# Patient Record
Sex: Female | Born: 1940 | Race: White | Hispanic: No | Smoking: Never smoker
Health system: Southern US, Community
[De-identification: ages and names within clinical notes are randomized; demographics above are authoritative.]

## PROBLEM LIST (undated history)

## (undated) DIAGNOSIS — R413 Other amnesia: Secondary | ICD-10-CM

## (undated) DIAGNOSIS — Z87442 Personal history of urinary calculi: Secondary | ICD-10-CM

## (undated) DIAGNOSIS — I5032 Chronic diastolic (congestive) heart failure: Secondary | ICD-10-CM

## (undated) DIAGNOSIS — E7800 Pure hypercholesterolemia, unspecified: Secondary | ICD-10-CM

## (undated) DIAGNOSIS — M810 Age-related osteoporosis without current pathological fracture: Secondary | ICD-10-CM

## (undated) DIAGNOSIS — M170 Bilateral primary osteoarthritis of knee: Secondary | ICD-10-CM

## (undated) DIAGNOSIS — I10 Essential (primary) hypertension: Secondary | ICD-10-CM

## (undated) HISTORY — DX: Age-related osteoporosis without current pathological fracture: M81.0

## (undated) HISTORY — DX: Personal history of urinary calculi: Z87.442

---

## 2005-03-27 ENCOUNTER — Ambulatory Visit: Payer: Self-pay | Admitting: Family Medicine

## 2006-02-19 ENCOUNTER — Emergency Department: Payer: Self-pay | Admitting: Emergency Medicine

## 2010-02-17 LAB — HM PAP SMEAR

## 2010-03-20 ENCOUNTER — Ambulatory Visit: Payer: Self-pay | Admitting: Family Medicine

## 2010-03-31 ENCOUNTER — Ambulatory Visit: Payer: Self-pay | Admitting: Family Medicine

## 2011-04-07 ENCOUNTER — Ambulatory Visit: Payer: Self-pay | Admitting: Family Medicine

## 2012-04-11 DIAGNOSIS — Z1331 Encounter for screening for depression: Secondary | ICD-10-CM | POA: Diagnosis not present

## 2012-04-11 DIAGNOSIS — M810 Age-related osteoporosis without current pathological fracture: Secondary | ICD-10-CM | POA: Diagnosis not present

## 2012-04-11 DIAGNOSIS — Z0000 Encounter for general adult medical examination without abnormal findings: Secondary | ICD-10-CM | POA: Diagnosis not present

## 2012-04-11 DIAGNOSIS — E785 Hyperlipidemia, unspecified: Secondary | ICD-10-CM | POA: Diagnosis not present

## 2012-04-11 DIAGNOSIS — Z1289 Encounter for screening for malignant neoplasm of other sites: Secondary | ICD-10-CM | POA: Diagnosis not present

## 2012-05-05 DIAGNOSIS — Z23 Encounter for immunization: Secondary | ICD-10-CM | POA: Diagnosis not present

## 2012-06-02 ENCOUNTER — Ambulatory Visit: Payer: Self-pay | Admitting: Family Medicine

## 2012-06-02 DIAGNOSIS — Z1231 Encounter for screening mammogram for malignant neoplasm of breast: Secondary | ICD-10-CM | POA: Diagnosis not present

## 2013-04-17 DIAGNOSIS — M810 Age-related osteoporosis without current pathological fracture: Secondary | ICD-10-CM | POA: Diagnosis not present

## 2013-04-17 DIAGNOSIS — Z13220 Encounter for screening for lipoid disorders: Secondary | ICD-10-CM | POA: Diagnosis not present

## 2013-04-17 DIAGNOSIS — Z23 Encounter for immunization: Secondary | ICD-10-CM | POA: Diagnosis not present

## 2013-04-17 DIAGNOSIS — Z0000 Encounter for general adult medical examination without abnormal findings: Secondary | ICD-10-CM | POA: Diagnosis not present

## 2013-04-19 ENCOUNTER — Ambulatory Visit: Payer: Self-pay | Admitting: Family Medicine

## 2013-04-19 DIAGNOSIS — N959 Unspecified menopausal and perimenopausal disorder: Secondary | ICD-10-CM | POA: Diagnosis not present

## 2013-04-19 DIAGNOSIS — M8610 Other acute osteomyelitis, unspecified site: Secondary | ICD-10-CM | POA: Diagnosis not present

## 2013-04-19 DIAGNOSIS — Z780 Asymptomatic menopausal state: Secondary | ICD-10-CM | POA: Diagnosis not present

## 2013-04-19 LAB — HM DEXA SCAN

## 2013-06-04 ENCOUNTER — Ambulatory Visit: Payer: Self-pay | Admitting: Family Medicine

## 2013-06-04 DIAGNOSIS — Z1231 Encounter for screening mammogram for malignant neoplasm of breast: Secondary | ICD-10-CM | POA: Diagnosis not present

## 2013-06-04 DIAGNOSIS — R922 Inconclusive mammogram: Secondary | ICD-10-CM | POA: Diagnosis not present

## 2014-03-14 DIAGNOSIS — Z23 Encounter for immunization: Secondary | ICD-10-CM | POA: Diagnosis not present

## 2014-05-22 DIAGNOSIS — Z23 Encounter for immunization: Secondary | ICD-10-CM | POA: Diagnosis not present

## 2014-05-22 DIAGNOSIS — E039 Hypothyroidism, unspecified: Secondary | ICD-10-CM | POA: Diagnosis not present

## 2014-05-22 DIAGNOSIS — Z0000 Encounter for general adult medical examination without abnormal findings: Secondary | ICD-10-CM | POA: Diagnosis not present

## 2014-05-22 DIAGNOSIS — E785 Hyperlipidemia, unspecified: Secondary | ICD-10-CM | POA: Diagnosis not present

## 2014-05-22 DIAGNOSIS — M810 Age-related osteoporosis without current pathological fracture: Secondary | ICD-10-CM | POA: Diagnosis not present

## 2014-06-05 ENCOUNTER — Ambulatory Visit: Payer: Self-pay | Admitting: Family Medicine

## 2014-06-05 DIAGNOSIS — Z1231 Encounter for screening mammogram for malignant neoplasm of breast: Secondary | ICD-10-CM | POA: Diagnosis not present

## 2014-06-05 LAB — HM MAMMOGRAPHY

## 2014-09-06 DIAGNOSIS — J029 Acute pharyngitis, unspecified: Secondary | ICD-10-CM | POA: Diagnosis not present

## 2014-09-06 DIAGNOSIS — A084 Viral intestinal infection, unspecified: Secondary | ICD-10-CM | POA: Diagnosis not present

## 2014-11-26 DIAGNOSIS — J069 Acute upper respiratory infection, unspecified: Secondary | ICD-10-CM | POA: Diagnosis not present

## 2015-04-30 DIAGNOSIS — M810 Age-related osteoporosis without current pathological fracture: Secondary | ICD-10-CM | POA: Insufficient documentation

## 2015-05-26 ENCOUNTER — Encounter: Payer: Self-pay | Admitting: Family Medicine

## 2015-06-05 ENCOUNTER — Telehealth: Payer: Self-pay | Admitting: Family Medicine

## 2015-06-05 ENCOUNTER — Encounter: Payer: Self-pay | Admitting: Family Medicine

## 2015-06-05 ENCOUNTER — Ambulatory Visit (INDEPENDENT_AMBULATORY_CARE_PROVIDER_SITE_OTHER): Payer: Medicare Other | Admitting: Family Medicine

## 2015-06-05 ENCOUNTER — Other Ambulatory Visit: Payer: Self-pay

## 2015-06-05 VITALS — BP 119/72 | HR 62 | Temp 97.9°F | Ht 65.5 in | Wt 113.0 lb

## 2015-06-05 DIAGNOSIS — M810 Age-related osteoporosis without current pathological fracture: Secondary | ICD-10-CM | POA: Diagnosis not present

## 2015-06-05 DIAGNOSIS — R5383 Other fatigue: Secondary | ICD-10-CM | POA: Insufficient documentation

## 2015-06-05 DIAGNOSIS — M170 Bilateral primary osteoarthritis of knee: Secondary | ICD-10-CM | POA: Diagnosis not present

## 2015-06-05 DIAGNOSIS — Z23 Encounter for immunization: Secondary | ICD-10-CM

## 2015-06-05 DIAGNOSIS — R5382 Chronic fatigue, unspecified: Secondary | ICD-10-CM

## 2015-06-05 DIAGNOSIS — Z0000 Encounter for general adult medical examination without abnormal findings: Secondary | ICD-10-CM

## 2015-06-05 DIAGNOSIS — Z1211 Encounter for screening for malignant neoplasm of colon: Secondary | ICD-10-CM

## 2015-06-05 LAB — URINALYSIS, ROUTINE W REFLEX MICROSCOPIC
BILIRUBIN UA: NEGATIVE
GLUCOSE, UA: NEGATIVE
KETONES UA: NEGATIVE
Leukocytes, UA: NEGATIVE
NITRITE UA: NEGATIVE
Protein, UA: NEGATIVE
RBC, UA: NEGATIVE
Specific Gravity, UA: 1.02 (ref 1.005–1.030)
pH, UA: 8.5 — ABNORMAL HIGH (ref 5.0–7.5)

## 2015-06-05 MED ORDER — RALOXIFENE HCL 60 MG PO TABS: 60.0000 mg | ORAL_TABLET | Freq: Every day | ORAL | Status: DC

## 2015-06-05 MED ORDER — TRETINOIN 0.05 % EX CREA: TOPICAL_CREAM | Freq: Every day | CUTANEOUS | Status: DC

## 2015-06-05 NOTE — Progress Notes (Signed)
BP 119/72 mmHg  Pulse 62  Temp(Src) 97.9 F (36.6 C)  Ht 5' 5.5" (1.664 m)  Wt 113 lb (51.256 kg)  BMI 18.51 kg/m2  SpO2 99%   Subjective:    Patient ID: Amanda Bruce, female    DOB: Oct 11, 1940, 74 y.o.   MRN: [id number]  HPI: Amanda Bruce is a 74 y.o. female  Chief Complaint  Patient presents with  . Annual Exam   patient doing well with no complaints taking Evista without problems Dances 3 nights a week has some occasional twinges of her hip and knee but nothing that bothers her dancing. Occasional use of Retin-A cream without problems Patient was some fatigue at times especially at the end of the day sleeps well discerned about thyroid or blood count Relevant past medical, surgical, family and social history reviewed and updated as indicated. Interim medical history since our last visit reviewed. Allergies and medications reviewed and updated.  Review of Systems  Constitutional: Negative.   HENT: Negative.   Eyes: Negative.   Respiratory: Negative.   Cardiovascular: Negative.   Gastrointestinal: Negative.   Endocrine: Negative.   Genitourinary: Negative.   Musculoskeletal: Negative.   Skin: Negative.   Allergic/Immunologic: Negative.   Neurological: Negative.   Hematological: Negative.   Psychiatric/Behavioral: Negative.     Per HPI unless specifically indicated above     Objective:    BP 119/72 mmHg  Pulse 62  Temp(Src) 97.9 F (36.6 C)  Ht 5' 5.5" (1.664 m)  Wt 113 lb (51.256 kg)  BMI 18.51 kg/m2  SpO2 99%  Wt Readings from Last 3 Encounters:  06/05/15 113 lb (51.256 kg)  11/26/14 111 lb (50.349 kg)    Physical Exam  Constitutional: She is oriented to person, place, and time. She appears well-developed and well-nourished. No distress.  HENT:  Head: Normocephalic and atraumatic.  Right Ear: Hearing normal.  Left Ear: Hearing normal.  Nose: Nose normal.  Eyes: Conjunctivae and lids are normal. Right eye exhibits no discharge. Left eye  exhibits no discharge. No scleral icterus.  Pulmonary/Chest: Effort normal. No respiratory distress.  Musculoskeletal: Normal range of motion.  Neurological: She is alert and oriented to person, place, and time.  Skin: Skin is intact. No rash noted.  Psychiatric: She has a normal mood and affect. Her speech is normal and behavior is normal. Judgment and thought content normal. Cognition and memory are normal.    Results for orders placed or performed in visit on 04/30/15  HM MAMMOGRAPHY  Result Value Ref Range   HM Mammogram per PP   HM DEXA SCAN  Result Value Ref Range   HM Dexa Scan per PP   HM PAP SMEAR  Result Value Ref Range   HM Pap smear per PP       Assessment & Plan:   Problem List Items Addressed This Visit      Musculoskeletal and Integument   Osteoporosis    The current medical regimen is effective;  continue present plan and medications.       Relevant Medications   raloxifene (EVISTA) 60 MG tablet   Other Relevant Orders   CBC with Differential/Platelet   Comprehensive metabolic panel   Urinalysis, Routine w reflex microscopic (not at [facility])   TSH   Osteoarthritis of both knees    Discussed care and treatment of arthritis strengthening use of over-the-counter medications      Relevant Orders   CBC with Differential/Platelet   Comprehensive metabolic panel  Urinalysis, Routine w reflex microscopic (not at [facility])   TSH     Other   Fatigue   Relevant Orders   CBC with Differential/Platelet   Comprehensive metabolic panel   Urinalysis, Routine w reflex microscopic (not at [facility])   TSH    Other Visit Diagnoses    Immunization due    -  Primary    Relevant Orders    Flu Vaccine QUAD 36+ mos PF IM (Fluarix & Fluzone Quad PF) (Completed)    PE (physical exam), annual        Relevant Orders    CBC with Differential/Platelet    Comprehensive metabolic panel    Urinalysis, Routine w reflex microscopic (not at [facility])    TSH        Follow up plan: No  Follow-up on file.

## 2015-06-05 NOTE — Assessment & Plan Note (Signed)
Discussed care and treatment of arthritis strengthening use of over-the-counter medications

## 2015-06-05 NOTE — Assessment & Plan Note (Signed)
The current medical regimen is effective;  continue present plan and medications.  

## 2015-06-05 NOTE — Telephone Encounter (Signed)
Pt would like you to put order in for her mammogram.

## 2015-06-06 LAB — COMPREHENSIVE METABOLIC PANEL
A/G RATIO: 2 (ref 1.1–2.5)
ALBUMIN: 4.2 g/dL (ref 3.5–4.8)
ALK PHOS: 73 IU/L (ref 39–117)
ALT: 8 IU/L (ref 0–32)
AST: 16 IU/L (ref 0–40)
BUN / CREAT RATIO: 36 — AB (ref 11–26)
BUN: 23 mg/dL (ref 8–27)
Bilirubin Total: 0.5 mg/dL (ref 0.0–1.2)
CALCIUM: 9.2 mg/dL (ref 8.7–10.3)
CO2: 31 mmol/L — AB (ref 18–29)
CREATININE: 0.64 mg/dL (ref 0.57–1.00)
Chloride: 103 mmol/L (ref 97–106)
GFR calc Af Amer: 102 mL/min/{1.73_m2} (ref >59)
GFR, EST NON AFRICAN AMERICAN: 89 mL/min/{1.73_m2} (ref >59)
GLOBULIN, TOTAL: 2.1 g/dL (ref 1.5–4.5)
Glucose: 77 mg/dL (ref 65–99)
Potassium: 4.2 mmol/L (ref 3.5–5.2)
SODIUM: 145 mmol/L — AB (ref 136–144)
Total Protein: 6.3 g/dL (ref 6.0–8.5)

## 2015-06-06 LAB — CBC WITH DIFFERENTIAL/PLATELET
BASOS: 1 %
Basophils Absolute: 0 10*3/uL (ref 0.0–0.2)
EOS (ABSOLUTE): 0.1 10*3/uL (ref 0.0–0.4)
EOS: 2 %
HEMOGLOBIN: 13 g/dL (ref 11.1–15.9)
Hematocrit: 38.3 % (ref 34.0–46.6)
Immature Grans (Abs): 0 10*3/uL (ref 0.0–0.1)
Immature Granulocytes: 0 %
LYMPHS ABS: 1.2 10*3/uL (ref 0.7–3.1)
Lymphs: 30 %
MCH: 32.9 pg (ref 26.6–33.0)
MCHC: 33.9 g/dL (ref 31.5–35.7)
MCV: 97 fL (ref 79–97)
MONOCYTES: 8 %
MONOS ABS: 0.3 10*3/uL (ref 0.1–0.9)
NEUTROS ABS: 2.3 10*3/uL (ref 1.4–7.0)
Neutrophils: 59 %
Platelets: 237 10*3/uL (ref 150–379)
RBC: 3.95 x10E6/uL (ref 3.77–5.28)
RDW: 13.4 % (ref 12.3–15.4)
WBC: 4 10*3/uL (ref 3.4–10.8)

## 2015-06-06 LAB — TSH: TSH: 1.49 u[IU]/mL (ref 0.450–4.500)

## 2015-06-09 ENCOUNTER — Other Ambulatory Visit: Payer: Self-pay

## 2015-06-09 ENCOUNTER — Encounter: Payer: Self-pay | Admitting: Family Medicine

## 2015-06-09 DIAGNOSIS — Z1239 Encounter for other screening for malignant neoplasm of breast: Secondary | ICD-10-CM

## 2015-06-09 NOTE — Telephone Encounter (Signed)
Entered order, ABN was created, Medicare wants every two years. Patient notified

## 2015-06-09 NOTE — Telephone Encounter (Signed)
mamo order? See note

## 2015-06-10 ENCOUNTER — Other Ambulatory Visit: Payer: Self-pay | Admitting: Family Medicine

## 2015-06-10 DIAGNOSIS — Z1231 Encounter for screening mammogram for malignant neoplasm of breast: Secondary | ICD-10-CM

## 2015-06-11 ENCOUNTER — Ambulatory Visit: Payer: Self-pay

## 2015-06-12 DIAGNOSIS — Z1211 Encounter for screening for malignant neoplasm of colon: Secondary | ICD-10-CM | POA: Diagnosis not present

## 2015-06-12 DIAGNOSIS — Z1212 Encounter for screening for malignant neoplasm of rectum: Secondary | ICD-10-CM | POA: Diagnosis not present

## 2015-06-13 ENCOUNTER — Other Ambulatory Visit: Payer: Self-pay | Admitting: Family Medicine

## 2015-06-13 ENCOUNTER — Ambulatory Visit
Admission: RE | Admit: 2015-06-13 | Discharge: 2015-06-13 | Disposition: A | Discharge status: Home / Self Care | Payer: Medicare Other | Source: Ambulatory Visit | Attending: Family Medicine | Admitting: Family Medicine

## 2015-06-13 DIAGNOSIS — Z1231 Encounter for screening mammogram for malignant neoplasm of breast: Secondary | ICD-10-CM | POA: Diagnosis not present

## 2015-06-26 LAB — COLOGUARD: Cologuard: NEGATIVE

## 2015-07-02 ENCOUNTER — Other Ambulatory Visit: Payer: Self-pay

## 2015-07-09 ENCOUNTER — Telehealth: Payer: Self-pay | Admitting: Family Medicine

## 2015-07-09 NOTE — Telephone Encounter (Signed)
Patient son called stating that patient has been coughing a lot and needs to be seen, he thinks she may have pneumonia. Per Tiffany Reel and Amy Rachell Cipro they stated for me to tell him that if patient gets any worse with fever, chest pain, shortness of breath to go to the local ER or urgent care due to her age. Son said that she will be fine until appointment and will keep her appointment on Friday January the 78 th.

## 2015-07-11 ENCOUNTER — Encounter: Payer: Self-pay | Admitting: Family Medicine

## 2015-07-11 ENCOUNTER — Ambulatory Visit (INDEPENDENT_AMBULATORY_CARE_PROVIDER_SITE_OTHER): Payer: Medicare Other | Admitting: Family Medicine

## 2015-07-11 VITALS — BP 139/83 | HR 89 | Temp 98.0°F | Ht 64.6 in | Wt 108.0 lb

## 2015-07-11 DIAGNOSIS — J209 Acute bronchitis, unspecified: Secondary | ICD-10-CM | POA: Diagnosis not present

## 2015-07-11 DIAGNOSIS — J40 Bronchitis, not specified as acute or chronic: Secondary | ICD-10-CM

## 2015-07-11 MED ORDER — HYDROCOD POLST-CPM POLST ER 10-8 MG/5ML PO SUER: 5.0000 mL | Freq: Every evening | ORAL | Status: DC | PRN

## 2015-07-11 MED ORDER — ALBUTEROL SULFATE (2.5 MG/3ML) 0.083% IN NEBU: 2.5000 mg | INHALATION_SOLUTION | Freq: Once | RESPIRATORY_TRACT | Status: DC

## 2015-07-11 MED ORDER — BENZONATATE 200 MG PO CAPS: 200.0000 mg | ORAL_CAPSULE | Freq: Three times a day (TID) | ORAL | Status: DC | PRN

## 2015-07-11 MED ORDER — DOXYCYCLINE HYCLATE 100 MG PO TABS: 100.0000 mg | ORAL_TABLET | Freq: Two times a day (BID) | ORAL | Status: DC

## 2015-07-11 NOTE — Progress Notes (Signed)
BP 139/83 mmHg  Pulse 89  Temp(Src) 98 F (36.7 C)  Ht 5' 4.6" (1.641 m)  Wt 108 lb (48.988 kg)  BMI 18.19 kg/m2  SpO2 97%   Subjective:    Patient ID: Amanda Bruce, female    DOB: 1941-06-23, 75 y.o.   MRN: [id number]  HPI: Amanda Bruce is a 75 y.o. female  Chief Complaint  Patient presents with  . Cough    X 1 week   UPPER RESPIRATORY TRACT INFECTION Duration: 6 days Worst symptom: Cough and congestion Fever: no Cough: yes Shortness of breath: no Wheezing: no Chest pain: no Chest tightness: yes Chest congestion: yes Nasal congestion: yes Runny nose: yes Post nasal drip: yes Sneezing: no Sore throat: yes Swollen glands: no Sinus pressure: no Headache: yes Face pain: no Toothache: yes Ear pain: no  Ear pressure: no  Eyes red/itching:no Eye drainage/crusting: no  Vomiting: no Rash: no Fatigue: yes Sick contacts: no Strep contacts: no  Context: better Recurrent sinusitis: no Relief with OTC cold/cough medications: yes  Treatments attempted: mucinex   Relevant past medical, surgical, family and social history reviewed and updated as indicated. Interim medical history since our last visit reviewed. Allergies and medications reviewed and updated.  Review of Systems  Constitutional: Negative.   HENT: Negative.   Respiratory: Negative.   Cardiovascular: Negative.   Gastrointestinal: Positive for nausea and diarrhea. Negative for vomiting, abdominal pain, constipation, blood in stool, abdominal distention, anal bleeding and rectal pain.  Psychiatric/Behavioral: Negative.     Per HPI unless specifically indicated above     Objective:    BP 139/83 mmHg  Pulse 89  Temp(Src) 98 F (36.7 C)  Ht 5' 4.6" (1.641 m)  Wt 108 lb (48.988 kg)  BMI 18.19 kg/m2  SpO2 97%  Wt Readings from Last 3 Encounters:  07/11/15 108 lb (48.988 kg)  06/05/15 113 lb (51.256 kg)  11/26/14 111 lb (50.349 kg)    Physical Exam  Constitutional: She is oriented to  person, place, and time. She appears well-developed and well-nourished. No distress.  HENT:  Head: Normocephalic and atraumatic.  Right Ear: Hearing and external ear normal.  Left Ear: Hearing and external ear normal.  Nose: Mucosal edema and rhinorrhea present. No nose lacerations, sinus tenderness, nasal deformity, septal deviation or nasal septal hematoma. No epistaxis.  No foreign bodies. Right sinus exhibits no maxillary sinus tenderness and no frontal sinus tenderness. Left sinus exhibits no maxillary sinus tenderness and no frontal sinus tenderness.  Mouth/Throat: Uvula is midline and mucous membranes are normal. Posterior oropharyngeal erythema present. No oropharyngeal exudate, posterior oropharyngeal edema or tonsillar abscesses.  Eyes: Conjunctivae, EOM and lids are normal. Pupils are equal, round, and reactive to light. Right eye exhibits no discharge. Left eye exhibits no discharge. No scleral icterus.  Neck: Normal range of motion. Neck supple. No JVD present. No tracheal deviation present. No thyromegaly present.  Cardiovascular: Normal rate, regular rhythm, normal heart sounds and intact distal pulses.  Exam reveals no gallop and no friction rub.   No murmur heard. Pulmonary/Chest: Effort normal. No stridor. No respiratory distress. She has decreased breath sounds. She has no wheezes. She has rhonchi in the right lower field and the left lower field. She has no rales. She exhibits no tenderness.  Musculoskeletal: Normal range of motion.  Lymphadenopathy:    She has cervical adenopathy.  Neurological: She is alert and oriented to person, place, and time.  Skin: Skin is warm, dry and intact. No rash  noted. No erythema. No pallor.  Psychiatric: She has a normal mood and affect. Her speech is normal and behavior is normal. Judgment and thought content normal. Cognition and memory are normal.  Nursing note and vitals reviewed.   Results for orders placed or performed in visit on  06/27/15  Cologuard  Result Value Ref Range   Cologuard Negative       Assessment & Plan:   Problem List Items Addressed This Visit    None    Visit Diagnoses    Bronchitis    -  Primary    Will treat with doxycycline and tessalon perles and tussionex for comfort. Retun in 2 weeks for lung recheck. Call if not getting better or getting worse.    Relevant Medications    albuterol (PROVENTIL) (2.5 MG/3ML) 0.083% nebulizer solution 2.5 mg        Follow up plan: Return in about 2 weeks (around 07/25/2015) for Lung recheck.

## 2015-07-25 ENCOUNTER — Ambulatory Visit (INDEPENDENT_AMBULATORY_CARE_PROVIDER_SITE_OTHER): Payer: Medicare Other | Admitting: Family Medicine

## 2015-07-25 ENCOUNTER — Encounter: Payer: Self-pay | Admitting: Family Medicine

## 2015-07-25 VITALS — BP 132/79 | HR 58 | Temp 98.7°F | Ht 65.1 in | Wt 111.0 lb

## 2015-07-25 DIAGNOSIS — R05 Cough: Secondary | ICD-10-CM

## 2015-07-25 DIAGNOSIS — Z23 Encounter for immunization: Secondary | ICD-10-CM

## 2015-07-25 DIAGNOSIS — R059 Cough, unspecified: Secondary | ICD-10-CM

## 2015-07-25 MED ORDER — ALBUTEROL SULFATE HFA 108 (90 BASE) MCG/ACT IN AERS: 2.0000 | INHALATION_SPRAY | Freq: Four times a day (QID) | RESPIRATORY_TRACT | Status: DC | PRN

## 2015-07-25 NOTE — Progress Notes (Signed)
BP 132/79 mmHg  Pulse 58  Temp(Src) 98.7 F (37.1 C)  Ht 5' 5.1" (1.654 m)  Wt 111 lb (50.349 kg)  BMI 18.40 kg/m2  SpO2 99%   Subjective:    Patient ID: Amanda Bruce, female    DOB: June 05, 1941, 75 y.o.   MRN: [id number]  HPI: Amanda Bruce is a 75 y.o. female  Chief Complaint  Patient presents with  . Lung Recheck   Medicine seemed like it helped. She is feeling much better and is able to get back to her dancing, which he really appreciates. She is still coughing a bit and notes that she has a lot of clear discharge out of her nose. No fevers, no chills, shortness of breath is better. No other concerns or complaints at this time.   Relevant past medical, surgical, family and social history reviewed and updated as indicated. Interim medical history since our last visit reviewed. Allergies and medications reviewed and updated.  Review of Systems  Constitutional: Negative.   HENT: Positive for rhinorrhea. Negative for congestion, dental problem, drooling, ear discharge, ear pain, facial swelling, hearing loss, mouth sores, nosebleeds, postnasal drip, sinus pressure, sneezing, sore throat, tinnitus, trouble swallowing and voice change.   Respiratory: Positive for cough. Negative for apnea, choking, chest tightness, shortness of breath, wheezing and stridor.   Cardiovascular: Negative.   Psychiatric/Behavioral: Negative.     Per HPI unless specifically indicated above     Objective:    BP 132/79 mmHg  Pulse 58  Temp(Src) 98.7 F (37.1 C)  Ht 5' 5.1" (1.654 m)  Wt 111 lb (50.349 kg)  BMI 18.40 kg/m2  SpO2 99%  Wt Readings from Last 3 Encounters:  07/25/15 111 lb (50.349 kg)  07/11/15 108 lb (48.988 kg)  06/05/15 113 lb (51.256 kg)    Physical Exam  Constitutional: She is oriented to person, place, and time. She appears well-developed and well-nourished. No distress.  HENT:  Head: Normocephalic and atraumatic.  Right Ear: Hearing, tympanic membrane, external  ear and ear canal normal.  Left Ear: Hearing, tympanic membrane, external ear and ear canal normal.  Nose: Rhinorrhea present. No mucosal edema, nose lacerations, sinus tenderness, nasal deformity, septal deviation or nasal septal hematoma. No epistaxis.  No foreign bodies.  Mouth/Throat: Uvula is midline, oropharynx is clear and moist and mucous membranes are normal. No oropharyngeal exudate.  Eyes: Conjunctivae, EOM and lids are normal. Pupils are equal, round, and reactive to light. Right eye exhibits no discharge. Left eye exhibits no discharge. No scleral icterus.  Neck: Normal range of motion. Neck supple. No JVD present. No tracheal deviation present. No thyromegaly present.  Cardiovascular: Normal rate, regular rhythm, normal heart sounds and intact distal pulses.  Exam reveals no gallop and no friction rub.   No murmur heard. Pulmonary/Chest: Effort normal. No respiratory distress. She has decreased breath sounds in the right middle field, the right lower field, the left middle field and the left lower field. She has no wheezes. She has no rales. She exhibits no tenderness.  Musculoskeletal: Normal range of motion.  Lymphadenopathy:    She has no cervical adenopathy.  Neurological: She is alert and oriented to person, place, and time.  Skin: Skin is warm, dry and intact. No rash noted. She is not diaphoretic. No erythema. No pallor.  Psychiatric: She has a normal mood and affect. Her speech is normal and behavior is normal. Judgment and thought content normal. Cognition and memory are normal.  Nursing note and vitals  reviewed.   Results for orders placed or performed in visit on 06/27/15  Cologuard  Result Value Ref Range   Cologuard Negative       Assessment & Plan:   Problem List Items Addressed This Visit    None    Visit Diagnoses    Cough    -  Primary    Will treat with albuterol. Call with any problems or concerns. OTC medications discussed for rhinorrhea.    Relevant  Orders    Spirometry with graph (Completed)    Immunization due        Rx for shingles shot given today.      Pre-nebulizer spirometry did not save due to computer issues. Values not available, but she did not hit her expected values. Post spirometry was normal  Follow up plan: Return if symptoms worsen or fail to improve.

## 2015-07-25 NOTE — Patient Instructions (Addendum)
Nasal saline  Sudafed Both of these should help with runny noseAlbuterol inhalation aerosol What is this medicine? ALBUTEROL (al Normajean [place]) is a bronchodilator. It helps open up the airways in your lungs to make it easier to breathe. This medicine is used to treat and to prevent bronchospasm. This medicine may be used for other purposes; ask your health care provider or pharmacist if you have questions. What should I tell my health care provider before I take this medicine? They need to know if you have any of the following conditions: -diabetes -heart disease or irregular heartbeat -high blood pressure -pheochromocytoma -seizures -thyroid disease -an unusual or allergic reaction to albuterol, levalbuterol, sulfites, other medicines, foods, dyes, or preservatives -pregnant or trying to get pregnant -breast-feeding How should I use this medicine? This medicine is for inhalation through the mouth. Follow the directions on your prescription label. Take your medicine at regular intervals. Do not use more often than directed. Make sure that you are using your inhaler correctly. Ask you doctor or health care provider if you have any questions. Talk to your pediatrician regarding the use of this medicine in children. Special care may be needed. Overdosage: If you think you have taken too much of this medicine contact a poison control center or emergency room at once. NOTE: This medicine is only for you. Do not share this medicine with others. What if I miss a dose? If you miss a dose, use it as soon as you can. If it is almost time for your next dose, use only that dose. Do not use double or extra doses. What may interact with this medicine? -anti-infectives like chloroquine and pentamidine -caffeine -cisapride -diuretics -medicines for colds -medicines for depression or for emotional or psychotic conditions -medicines for weight loss including some herbal products -methadone -some  antibiotics like clarithromycin, erythromycin, levofloxacin, and linezolid -some heart medicines -steroid hormones like dexamethasone, cortisone, hydrocortisone -theophylline -thyroid hormones This list may not describe all possible interactions. Give your health care provider a list of all the medicines, herbs, non-prescription drugs, or dietary supplements you use. Also tell them if you smoke, drink alcohol, or use illegal drugs. Some items may interact with your medicine. What should I watch for while using this medicine? Tell your doctor or health care professional if your symptoms do not improve. Do not use extra albuterol. If your asthma or bronchitis gets worse while you are using this medicine, call your doctor right away. If your mouth gets dry try chewing sugarless gum or sucking hard candy. Drink water as directed. What side effects may I notice from receiving this medicine? Side effects that you should report to your doctor or health care professional as soon as possible: -allergic reactions like skin rash, itching or hives, swelling of the face, lips, or tongue -breathing problems -chest pain -feeling faint or lightheaded, falls -high blood pressure -irregular heartbeat -fever -muscle cramps or weakness -pain, tingling, numbness in the hands or feet -vomiting Side effects that usually do not require medical attention (report to your doctor or health care professional if they continue or are bothersome): -cough -difficulty sleeping -headache -nervousness or trembling -stomach upset -stuffy or runny nose -throat irritation -unusual taste This list may not describe all possible side effects. Call your doctor for medical advice about side effects. You may report side effects to FDA at 1-800-FDA-1088. Where should I keep my medicine? Keep out of the reach of children. Store at room temperature between 15 and 30 degrees C (  59 and 86 degrees F). The contents are under pressure  and may burst when exposed to heat or flame. Do not freeze. This medicine does not work as well if it is too cold. Throw away any unused medicine after the expiration date. Inhalers need to be thrown away after the labeled number of puffs have been used or by the expiration date; whichever comes first. Ventolin HFA should be thrown away 12 months after removing from foil pouch. Check the instructions that come with your medicine. NOTE: This sheet is a summary. It may not cover all possible information. If you have questions about this medicine, talk to your doctor, pharmacist, or health care provider.    2016, [employer]vier/[employer] Standard. (2012-11-30 10:57:17)

## 2015-08-04 ENCOUNTER — Telehealth: Payer: Self-pay | Admitting: Family Medicine

## 2015-08-04 NOTE — Telephone Encounter (Signed)
She can take OTC cold medicine. Nasal saline, chloraseptic. If she's not happy with that she should be seen.

## 2015-08-04 NOTE — Telephone Encounter (Signed)
Patient notified

## 2015-08-04 NOTE — Telephone Encounter (Signed)
Forward to provider

## 2015-08-04 NOTE — Telephone Encounter (Signed)
Pt would like to have something called in to [facility] court for her sore throat and runny nose.

## 2015-09-04 ENCOUNTER — Encounter: Payer: Self-pay | Admitting: *Deleted

## 2016-04-15 DIAGNOSIS — Z23 Encounter for immunization: Secondary | ICD-10-CM | POA: Diagnosis not present

## 2016-05-18 ENCOUNTER — Other Ambulatory Visit: Payer: Self-pay | Admitting: Family Medicine

## 2016-05-18 DIAGNOSIS — Z1231 Encounter for screening mammogram for malignant neoplasm of breast: Secondary | ICD-10-CM

## 2016-06-08 ENCOUNTER — Encounter: Payer: Self-pay | Admitting: Family Medicine

## 2016-06-08 ENCOUNTER — Ambulatory Visit (INDEPENDENT_AMBULATORY_CARE_PROVIDER_SITE_OTHER): Payer: Medicare Other | Admitting: Family Medicine

## 2016-06-08 VITALS — BP 134/83 | HR 70 | Temp 97.5°F | Ht 64.75 in | Wt 115.4 lb

## 2016-06-08 DIAGNOSIS — Z9189 Other specified personal risk factors, not elsewhere classified: Secondary | ICD-10-CM

## 2016-06-08 DIAGNOSIS — E7800 Pure hypercholesterolemia, unspecified: Secondary | ICD-10-CM | POA: Diagnosis not present

## 2016-06-08 DIAGNOSIS — M170 Bilateral primary osteoarthritis of knee: Secondary | ICD-10-CM | POA: Diagnosis not present

## 2016-06-08 DIAGNOSIS — Z0000 Encounter for general adult medical examination without abnormal findings: Secondary | ICD-10-CM | POA: Diagnosis not present

## 2016-06-08 DIAGNOSIS — R5382 Chronic fatigue, unspecified: Secondary | ICD-10-CM

## 2016-06-08 DIAGNOSIS — M810 Age-related osteoporosis without current pathological fracture: Secondary | ICD-10-CM

## 2016-06-08 LAB — URINALYSIS, ROUTINE W REFLEX MICROSCOPIC
BILIRUBIN UA: NEGATIVE
Glucose, UA: NEGATIVE
KETONES UA: NEGATIVE
Nitrite, UA: NEGATIVE
PH UA: 6 (ref 5.0–7.5)
PROTEIN UA: NEGATIVE
SPEC GRAV UA: 1.02 (ref 1.005–1.030)
Urobilinogen, Ur: 1 mg/dL (ref 0.2–1.0)

## 2016-06-08 LAB — MICROSCOPIC EXAMINATION

## 2016-06-08 MED ORDER — TRETINOIN 0.05 % EX CREA: TOPICAL_CREAM | Freq: Every day | CUTANEOUS | 6 refills | Status: DC

## 2016-06-08 NOTE — Assessment & Plan Note (Signed)
On review of chart patient's been on Evista for 10 years. We will recheck bone density and discontinue Evista. Patient will continue calcium and vitamin D.

## 2016-06-08 NOTE — Assessment & Plan Note (Signed)
Doing well and back dancing again 3 days a week.

## 2016-06-08 NOTE — Progress Notes (Signed)
BP 134/83 (BP Location: Left Arm, Patient Position: Sitting, Cuff Size: Small)   Pulse 70   Temp 97.5 F (36.4 C)   Ht 5' 4.75" (1.645 m)   Wt 115 lb 6.4 oz (52.3 kg)   SpO2 99%   BMI 19.35 kg/m    Subjective:    Patient ID: Amanda Bruce, female    DOB: 1940-07-07, 75 y.o.   MRN: [id number]  HPI: Amanda Bruce is a 75 y.o. female  Chief Complaint  Patient presents with  . Annual Exam  Patient all in all doing well no complaints has osteoporosis taking Evista without problems spent taken that for a long time. As not sure about when bone density is due. Has osteoarthritis of both knees. Sometimes takes Advil and Aleve or Tylenol but doesn't limit her dancing 3 days a week. Chart review patient's Evista was started in 2007. On density of hip in 2010 was -2.7. Repeated in 2014 was -2.2.  Relevant past medical, surgical, family and social history reviewed and updated as indicated. Interim medical history since our last visit reviewed. Allergies and medications reviewed and updated.  Review of Systems  Constitutional: Negative.   HENT: Negative.   Eyes: Negative.   Respiratory: Negative.   Cardiovascular: Negative.   Gastrointestinal: Negative.   Endocrine: Negative.   Genitourinary: Negative.   Musculoskeletal: Negative.   Skin: Negative.   Allergic/Immunologic: Negative.   Neurological: Negative.   Hematological: Negative.   Psychiatric/Behavioral: Negative.     Per HPI unless specifically indicated above     Objective:    BP 134/83 (BP Location: Left Arm, Patient Position: Sitting, Cuff Size: Small)   Pulse 70   Temp 97.5 F (36.4 C)   Ht 5' 4.75" (1.645 m)   Wt 115 lb 6.4 oz (52.3 kg)   SpO2 99%   BMI 19.35 kg/m   Wt Readings from Last 3 Encounters:  06/08/16 115 lb 6.4 oz (52.3 kg)  07/25/15 111 lb (50.3 kg)  07/11/15 108 lb (49 kg)    Physical Exam  Constitutional: She is oriented to person, place, and time. She appears well-developed and  well-nourished.  HENT:  Head: Normocephalic and atraumatic.  Right Ear: External ear normal.  Left Ear: External ear normal.  Nose: Nose normal.  Mouth/Throat: Oropharynx is clear and moist.  Eyes: Conjunctivae and EOM are normal. Pupils are equal, round, and reactive to light.  Neck: Normal range of motion. Neck supple. Carotid bruit is not present.  Cardiovascular: Normal rate, regular rhythm and normal heart sounds.   No murmur heard. Pulmonary/Chest: Effort normal and breath sounds normal. She exhibits no mass. Right breast exhibits no mass, no skin change and no tenderness. Left breast exhibits no mass, no skin change and no tenderness. Breasts are symmetrical.  Abdominal: Soft. Bowel sounds are normal. There is no hepatosplenomegaly.  Musculoskeletal: Normal range of motion.  Neurological: She is alert and oriented to person, place, and time.  Skin: No rash noted.  Psychiatric: She has a normal mood and affect. Her behavior is normal. Judgment and thought content normal.    Results for orders placed or performed in visit on 06/27/15  Cologuard  Result Value Ref Range   Cologuard Negative       Assessment & Plan:   Problem List Items Addressed This Visit      Musculoskeletal and Integument   Osteoporosis    On review of chart patient's been on Evista for 10 years. We will recheck bone  density and discontinue Evista. Patient will continue calcium and vitamin D.      Relevant Orders   Comprehensive metabolic panel   CBC with Differential/Platelet   TSH   Osteoarthritis of both knees    Continue when necessary care and use of Advil and Aleve Tylenol.        Other   Fatigue    Doing well and back dancing again 3 days a week.      Relevant Orders   Comprehensive metabolic panel   CBC with Differential/Platelet   TSH   Urinalysis, Routine w reflex microscopic   Hypercholesteremia    Diet controled      Relevant Orders   Lipid panel    Other Visit Diagnoses      PE (physical exam), annual    -  Primary   Relevant Orders   Comprehensive metabolic panel   Lipid panel   CBC with Differential/Platelet   TSH   Urinalysis, Routine w reflex microscopic       Follow up plan: Return in about 1 year (around 06/08/2017) for As scheduled.

## 2016-06-08 NOTE — Assessment & Plan Note (Signed)
Diet controled 

## 2016-06-08 NOTE — Addendum Note (Signed)
Addended by: Sandria Manly on: 06/08/2016 03:08 PM   Modules accepted: Orders

## 2016-06-08 NOTE — Assessment & Plan Note (Signed)
Continue when necessary care and use of Advil and Aleve Tylenol.

## 2016-06-09 ENCOUNTER — Encounter: Payer: Self-pay | Admitting: Family Medicine

## 2016-06-09 LAB — COMPREHENSIVE METABOLIC PANEL
ALBUMIN: 4.4 g/dL (ref 3.5–4.8)
ALK PHOS: 71 IU/L (ref 39–117)
ALT: 12 IU/L (ref 0–32)
AST: 19 IU/L (ref 0–40)
Albumin/Globulin Ratio: 1.8 (ref 1.2–2.2)
BUN/Creatinine Ratio: 49 — ABNORMAL HIGH (ref 12–28)
BUN: 29 mg/dL — ABNORMAL HIGH (ref 8–27)
Bilirubin Total: 0.7 mg/dL (ref 0.0–1.2)
CO2: 26 mmol/L (ref 18–29)
CREATININE: 0.59 mg/dL (ref 0.57–1.00)
Calcium: 9.4 mg/dL (ref 8.7–10.3)
Chloride: 103 mmol/L (ref 96–106)
GFR calc Af Amer: 104 mL/min/{1.73_m2} (ref >59)
GFR calc non Af Amer: 91 mL/min/{1.73_m2} (ref >59)
GLUCOSE: 82 mg/dL (ref 65–99)
Globulin, Total: 2.4 g/dL (ref 1.5–4.5)
Potassium: 4.3 mmol/L (ref 3.5–5.2)
Sodium: 145 mmol/L — ABNORMAL HIGH (ref 134–144)
Total Protein: 6.8 g/dL (ref 6.0–8.5)

## 2016-06-09 LAB — CBC WITH DIFFERENTIAL/PLATELET
BASOS: 0 %
Basophils Absolute: 0 10*3/uL (ref 0.0–0.2)
EOS (ABSOLUTE): 0.1 10*3/uL (ref 0.0–0.4)
Eos: 3 %
HEMATOCRIT: 39.6 % (ref 34.0–46.6)
Hemoglobin: 13.5 g/dL (ref 11.1–15.9)
IMMATURE GRANS (ABS): 0 10*3/uL (ref 0.0–0.1)
Immature Granulocytes: 0 %
Lymphocytes Absolute: 1.1 10*3/uL (ref 0.7–3.1)
Lymphs: 24 %
MCH: 32.3 pg (ref 26.6–33.0)
MCHC: 34.1 g/dL (ref 31.5–35.7)
MCV: 95 fL (ref 79–97)
MONOS ABS: 0.4 10*3/uL (ref 0.1–0.9)
Monocytes: 8 %
NEUTROS ABS: 3.1 10*3/uL (ref 1.4–7.0)
Neutrophils: 65 %
PLATELETS: 253 10*3/uL (ref 150–379)
RBC: 4.18 x10E6/uL (ref 3.77–5.28)
RDW: 13.2 % (ref 12.3–15.4)
WBC: 4.7 10*3/uL (ref 3.4–10.8)

## 2016-06-09 LAB — LIPID PANEL
CHOLESTEROL TOTAL: 191 mg/dL (ref 100–199)
Chol/HDL Ratio: 2.5 ratio units (ref 0.0–4.4)
HDL: 77 mg/dL (ref >39)
LDL CALC: 101 mg/dL — AB (ref 0–99)
TRIGLYCERIDES: 66 mg/dL (ref 0–149)
VLDL CHOLESTEROL CAL: 13 mg/dL (ref 5–40)

## 2016-06-09 LAB — TSH: TSH: 1.3 u[IU]/mL (ref 0.450–4.500)

## 2016-06-17 ENCOUNTER — Ambulatory Visit: Payer: Medicare Other

## 2016-07-22 ENCOUNTER — Ambulatory Visit
Admission: RE | Admit: 2016-07-22 | Discharge: 2016-07-22 | Disposition: A | Discharge status: Home / Self Care | Payer: Medicare Other | Source: Ambulatory Visit | Attending: Family Medicine | Admitting: Family Medicine

## 2016-07-22 DIAGNOSIS — Z1231 Encounter for screening mammogram for malignant neoplasm of breast: Secondary | ICD-10-CM

## 2016-07-22 DIAGNOSIS — M170 Bilateral primary osteoarthritis of knee: Secondary | ICD-10-CM | POA: Diagnosis not present

## 2016-07-22 DIAGNOSIS — M810 Age-related osteoporosis without current pathological fracture: Secondary | ICD-10-CM | POA: Diagnosis not present

## 2016-07-22 DIAGNOSIS — Z9189 Other specified personal risk factors, not elsewhere classified: Secondary | ICD-10-CM | POA: Diagnosis not present

## 2016-07-22 DIAGNOSIS — R5382 Chronic fatigue, unspecified: Secondary | ICD-10-CM | POA: Diagnosis not present

## 2016-12-07 ENCOUNTER — Ambulatory Visit (INDEPENDENT_AMBULATORY_CARE_PROVIDER_SITE_OTHER): Payer: Medicare Other | Admitting: Family Medicine

## 2016-12-07 ENCOUNTER — Encounter: Payer: Self-pay | Admitting: Family Medicine

## 2016-12-07 DIAGNOSIS — R002 Palpitations: Secondary | ICD-10-CM | POA: Diagnosis not present

## 2016-12-07 DIAGNOSIS — R42 Dizziness and giddiness: Secondary | ICD-10-CM | POA: Insufficient documentation

## 2016-12-07 DIAGNOSIS — M7632 Iliotibial band syndrome, left leg: Secondary | ICD-10-CM

## 2016-12-07 NOTE — Assessment & Plan Note (Signed)
Reviewed vertigo no orthostatic changes. Will check labs but predict will come back normal. Diet review patient with very limited protein intake discussed better diet nutrition to help feel better.

## 2016-12-07 NOTE — Progress Notes (Signed)
BP (!) 159/80 (BP Location: Right Arm)   Pulse 60   Wt 115 lb (52.2 kg)   SpO2 97%   BMI 19.29 kg/m    Subjective:    Patient ID: Amanda Bruce, female    DOB: 1941-03-24, 76 y.o.   MRN: [id number]  HPI: Amanda Bruce is a 76 y.o. female  Chief Complaint  Patient presents with  . Leg Pain    From left hip to foot.   Patient was some left lateral from hip to knee and sometimes down into her left lower leg laterally discomfort pain relieved with some Aleve spent ongoing about 5 months or so. Sometimes worse when waking up sometimes with sitting. It will act up some especially if using the clutch on the lawnmower. And on the old truck. Patient also very active with dancing at least 3 nights a week.  Patient also with lightheaded episodes especially sometimes with dancing sometimes with changing position last for a few minutes and goes away hasn't noticed any palpitations or changes heartbeat heart rate etc. Been ongoing a couple of weeks. On further discussion with patient patient very limited diet primarily salads some fruit and a lot of candy. Is unable to describe any protein intake.  Relevant past medical, surgical, family and social history reviewed and updated as indicated. Interim medical history since our last visit reviewed. Allergies and medications reviewed and updated.  Review of Systems  Constitutional: Negative.   Respiratory: Negative.   Cardiovascular: Negative.     Per HPI unless specifically indicated above     Objective:    BP (!) 159/80 (BP Location: Right Arm)   Pulse 60   Wt 115 lb (52.2 kg)   SpO2 97%   BMI 19.29 kg/m   Wt Readings from Last 3 Encounters:  12/07/16 115 lb (52.2 kg)  06/08/16 115 lb 6.4 oz (52.3 kg)  07/25/15 111 lb (50.3 kg)    Physical Exam  Constitutional: She is oriented to person, place, and time. She appears well-developed and well-nourished.  HENT:  Head: Normocephalic and atraumatic.  Right Ear: External ear  normal.  Left Ear: External ear normal.  Mouth/Throat: Oropharynx is clear and moist.  Eyes: Conjunctivae and EOM are normal. Pupils are equal, round, and reactive to light.  Neck: Normal range of motion. No tracheal deviation present. No thyromegaly present.  Cardiovascular: Normal rate, regular rhythm and normal heart sounds.   Pulmonary/Chest: Effort normal and breath sounds normal. She has no wheezes.  Abdominal: She exhibits no distension.  Musculoskeletal: Normal range of motion.  Tenderness left IT band  Lymphadenopathy:    She has cervical adenopathy.  Neurological: She is alert and oriented to person, place, and time.  Skin: No erythema.  Psychiatric: She has a normal mood and affect. Her behavior is normal. Judgment and thought content normal.    Results for orders placed or performed in visit on 06/08/16  Microscopic Examination  Result Value Ref Range   WBC, UA 0-5 0 - 5 /hpf   RBC, UA 0-2 0 - 2 /hpf   Epithelial Cells (non renal) 0-10 0 - 10 /hpf   Mucus, UA Present (A) Not Estab.   Bacteria, UA Few (A) None seen/Few  Comprehensive metabolic panel  Result Value Ref Range   Glucose 82 65 - 99 mg/dL   BUN 29 (H) 8 - 27 mg/dL   Creatinine, Ser 0.59 0.57 - 1.00 mg/dL   GFR calc non Af Amer 91 >59 mL/min/1.73  GFR calc Af Amer 104 >59 mL/min/1.73   BUN/Creatinine Ratio 49 (H) 12 - 28   Sodium 145 (H) 134 - 144 mmol/L   Potassium 4.3 3.5 - 5.2 mmol/L   Chloride 103 96 - 106 mmol/L   CO2 26 18 - 29 mmol/L   Calcium 9.4 8.7 - 10.3 mg/dL   Total Protein 6.8 6.0 - 8.5 g/dL   Albumin 4.4 3.5 - 4.8 g/dL   Globulin, Total 2.4 1.5 - 4.5 g/dL   Albumin/Globulin Ratio 1.8 1.2 - 2.2   Bilirubin Total 0.7 0.0 - 1.2 mg/dL   Alkaline Phosphatase 71 39 - 117 IU/L   AST 19 0 - 40 IU/L   ALT 12 0 - 32 IU/L  Lipid panel  Result Value Ref Range   Cholesterol, Total 191 100 - 199 mg/dL   Triglycerides 66 0 - 149 mg/dL   HDL 77 >39 mg/dL   VLDL Cholesterol Cal 13 5 - 40 mg/dL    LDL Calculated 101 (H) 0 - 99 mg/dL   Chol/HDL Ratio 2.5 0.0 - 4.4 ratio units  CBC with Differential/Platelet  Result Value Ref Range   WBC 4.7 3.4 - 10.8 x10E3/uL   RBC 4.18 3.77 - 5.28 x10E6/uL   Hemoglobin 13.5 11.1 - 15.9 g/dL   Hematocrit 39.6 34.0 - 46.6 %   MCV 95 79 - 97 fL   MCH 32.3 26.6 - 33.0 pg   MCHC 34.1 31.5 - 35.7 g/dL   RDW 13.2 12.3 - 15.4 %   Platelets 253 150 - 379 x10E3/uL   Neutrophils 65 Not Estab. %   Lymphs 24 Not Estab. %   Monocytes 8 Not Estab. %   Eos 3 Not Estab. %   Basos 0 Not Estab. %   Neutrophils Absolute 3.1 1.4 - 7.0 x10E3/uL   Lymphocytes Absolute 1.1 0.7 - 3.1 x10E3/uL   Monocytes Absolute 0.4 0.1 - 0.9 x10E3/uL   EOS (ABSOLUTE) 0.1 0.0 - 0.4 x10E3/uL   Basophils Absolute 0.0 0.0 - 0.2 x10E3/uL   Immature Granulocytes 0 Not Estab. %   Immature Grans (Abs) 0.0 0.0 - 0.1 x10E3/uL  TSH  Result Value Ref Range   TSH 1.300 0.450 - 4.500 uIU/mL  Urinalysis, Routine w reflex microscopic  Result Value Ref Range   Specific Gravity, UA 1.020 1.005 - 1.030   pH, UA 6.0 5.0 - 7.5   Color, UA Yellow Yellow   Appearance Ur Clear Clear   Leukocytes, UA Trace (A) Negative   Protein, UA Negative Negative/Trace   Glucose, UA Negative Negative   Ketones, UA Negative Negative   RBC, UA Trace (A) Negative   Bilirubin, UA Negative Negative   Urobilinogen, Ur 1.0 0.2 - 1.0 mg/dL   Nitrite, UA Negative Negative   Microscopic Examination See below:       Assessment & Plan:   Problem List Items Addressed This Visit      Musculoskeletal and Integument   It band syndrome, left    Discuss IT band care and treatment stretching use of Aleve        Other   Palpitations    EKG normal No Acute changes      Relevant Orders   EKG 12-Lead (Completed)   Basic metabolic panel   CBC with Differential/Platelet   Vertigo    Reviewed vertigo no orthostatic changes. Will check labs but predict will come back normal. Diet review patient with very  limited protein intake discussed better diet nutrition to help  feel better.      Relevant Orders   Basic metabolic panel   CBC with Differential/Platelet       Follow up plan: Return if symptoms worsen or fail to improve, for As scheduled.

## 2016-12-07 NOTE — Assessment & Plan Note (Signed)
Discuss IT band care and treatment stretching use of Aleve

## 2016-12-07 NOTE — Assessment & Plan Note (Signed)
EKG normal No Acute changes

## 2016-12-08 ENCOUNTER — Encounter: Payer: Self-pay | Admitting: Family Medicine

## 2016-12-08 LAB — CBC WITH DIFFERENTIAL/PLATELET
Basophils Absolute: 0 10*3/uL (ref 0.0–0.2)
Basos: 1 %
EOS (ABSOLUTE): 0.1 10*3/uL (ref 0.0–0.4)
Eos: 3 %
Hematocrit: 40.3 % (ref 34.0–46.6)
Hemoglobin: 13.9 g/dL (ref 11.1–15.9)
Immature Grans (Abs): 0 10*3/uL (ref 0.0–0.1)
Immature Granulocytes: 0 %
Lymphocytes Absolute: 1.4 10*3/uL (ref 0.7–3.1)
Lymphs: 33 %
MCH: 33.5 pg — ABNORMAL HIGH (ref 26.6–33.0)
MCHC: 34.5 g/dL (ref 31.5–35.7)
MCV: 97 fL (ref 79–97)
Monocytes Absolute: 0.3 10*3/uL (ref 0.1–0.9)
Monocytes: 7 %
Neutrophils Absolute: 2.3 10*3/uL (ref 1.4–7.0)
Neutrophils: 56 %
Platelets: 245 10*3/uL (ref 150–379)
RBC: 4.15 x10E6/uL (ref 3.77–5.28)
RDW: 13.4 % (ref 12.3–15.4)
WBC: 4.1 10*3/uL (ref 3.4–10.8)

## 2016-12-08 LAB — BASIC METABOLIC PANEL
BUN/Creatinine Ratio: 22 (ref 12–28)
BUN: 14 mg/dL (ref 8–27)
CO2: 27 mmol/L (ref 20–29)
Calcium: 9.8 mg/dL (ref 8.7–10.3)
Chloride: 103 mmol/L (ref 96–106)
Creatinine, Ser: 0.63 mg/dL (ref 0.57–1.00)
GFR calc Af Amer: 101 mL/min/{1.73_m2} (ref >59)
GFR calc non Af Amer: 88 mL/min/{1.73_m2} (ref >59)
Glucose: 82 mg/dL (ref 65–99)
Potassium: 4.5 mmol/L (ref 3.5–5.2)
Sodium: 144 mmol/L (ref 134–144)

## 2017-04-15 DIAGNOSIS — Z23 Encounter for immunization: Secondary | ICD-10-CM | POA: Diagnosis not present

## 2017-06-14 ENCOUNTER — Encounter: Payer: Self-pay | Admitting: Family Medicine

## 2017-06-14 ENCOUNTER — Other Ambulatory Visit: Payer: Self-pay | Admitting: Family Medicine

## 2017-06-14 ENCOUNTER — Ambulatory Visit (INDEPENDENT_AMBULATORY_CARE_PROVIDER_SITE_OTHER): Payer: Medicare Other | Admitting: Family Medicine

## 2017-06-14 VITALS — BP 128/68 | HR 61 | Temp 99.0°F | Ht 65.5 in | Wt 112.6 lb

## 2017-06-14 DIAGNOSIS — E7800 Pure hypercholesterolemia, unspecified: Secondary | ICD-10-CM | POA: Diagnosis not present

## 2017-06-14 DIAGNOSIS — Z0000 Encounter for general adult medical examination without abnormal findings: Secondary | ICD-10-CM

## 2017-06-14 DIAGNOSIS — M810 Age-related osteoporosis without current pathological fracture: Secondary | ICD-10-CM | POA: Diagnosis not present

## 2017-06-14 DIAGNOSIS — Z1231 Encounter for screening mammogram for malignant neoplasm of breast: Secondary | ICD-10-CM

## 2017-06-14 DIAGNOSIS — Z7189 Other specified counseling: Secondary | ICD-10-CM | POA: Insufficient documentation

## 2017-06-14 DIAGNOSIS — M170 Bilateral primary osteoarthritis of knee: Secondary | ICD-10-CM

## 2017-06-14 LAB — URINALYSIS, ROUTINE W REFLEX MICROSCOPIC
BILIRUBIN UA: NEGATIVE
Glucose, UA: NEGATIVE
KETONES UA: NEGATIVE
Nitrite, UA: NEGATIVE
PH UA: 6.5 (ref 5.0–7.5)
Protein, UA: NEGATIVE
SPEC GRAV UA: 1.02 (ref 1.005–1.030)
UUROB: 1 mg/dL (ref 0.2–1.0)

## 2017-06-14 LAB — MICROSCOPIC EXAMINATION

## 2017-06-14 NOTE — Assessment & Plan Note (Signed)
Labs pending.  

## 2017-06-14 NOTE — Progress Notes (Signed)
BP 128/68 (BP Location: Left Arm, Patient Position: Sitting, Cuff Size: Normal)   Pulse 61   Temp 99 F (37.2 C) (Temporal)   Ht 5' 5.5" (1.664 m)   Wt 112 lb 9.6 oz (51.1 kg)   SpO2 95%   BMI 18.45 kg/m    Subjective:    Patient ID: Amanda Bruce, female    DOB: [birth year], 76 y.o.   MRN: [id number]  HPI: Amanda Bruce is a 76 y.o. female  Chief Complaint  Patient presents with  . Annual Exam  patient all in all doing well still dancing 3 nights a week.. Knees arthritis is doing well. Other medical issues largely resolved. No complaints from osteoporosis. also cholesterol seems to be doing well.  Relevant past medical, surgical, family and social history reviewed and updated as indicated. Interim medical history since our last visit reviewed. Allergies and medications reviewed and updated.  Review of Systems  Constitutional: Negative.   HENT: Negative.   Eyes: Negative.   Respiratory: Negative.   Cardiovascular: Negative.   Gastrointestinal: Negative.   Endocrine: Negative.   Genitourinary: Negative.   Musculoskeletal: Negative.   Skin: Negative.   Allergic/Immunologic: Negative.   Neurological: Negative.   Hematological: Negative.   Psychiatric/Behavioral: Negative.     Per HPI unless specifically indicated above     Objective:    BP 128/68 (BP Location: Left Arm, Patient Position: Sitting, Cuff Size: Normal)   Pulse 61   Temp 99 F (37.2 C) (Temporal)   Ht 5' 5.5" (1.664 m)   Wt 112 lb 9.6 oz (51.1 kg)   SpO2 95%   BMI 18.45 kg/m   Wt Readings from Last 3 Encounters:  06/14/17 112 lb 9.6 oz (51.1 kg)  12/07/16 115 lb (52.2 kg)  06/08/16 115 lb 6.4 oz (52.3 kg)    Physical Exam  Constitutional: She is oriented to person, place, and time. She appears well-developed and well-nourished.  HENT:  Head: Normocephalic and atraumatic.  Right Ear: External ear normal.  Left Ear: External ear normal.  Nose: Nose normal.  Mouth/Throat:  Oropharynx is clear and moist.  Eyes: Conjunctivae and EOM are normal. Pupils are equal, round, and reactive to light.  Neck: Normal range of motion. Neck supple. Carotid bruit is not present.  Cardiovascular: Normal rate, regular rhythm and normal heart sounds.  No murmur heard. Pulmonary/Chest: Effort normal and breath sounds normal. She exhibits no mass. Right breast exhibits no mass, no skin change and no tenderness. Left breast exhibits no mass, no skin change and no tenderness. Breasts are symmetrical.  Abdominal: Soft. Bowel sounds are normal. There is no hepatosplenomegaly.  Musculoskeletal: Normal range of motion.  Neurological: She is alert and oriented to person, place, and time.  Skin: No rash noted.  Psychiatric: She has a normal mood and affect. Her behavior is normal. Judgment and thought content normal.    Results for orders placed or performed in visit on 39/76/73  Basic metabolic panel  Result Value Ref Range   Glucose 82 65 - 99 mg/dL   BUN 14 8 - 27 mg/dL   Creatinine, Ser 0.63 0.57 - 1.00 mg/dL   GFR calc non Af Amer 88 >59 mL/min/1.73   GFR calc Af Amer 101 >59 mL/min/1.73   BUN/Creatinine Ratio 22 12 - 28   Sodium 144 134 - 144 mmol/L   Potassium 4.5 3.5 - 5.2 mmol/L   Chloride 103 96 - 106 mmol/L   CO2 27 20 - 29 mmol/L  Calcium 9.8 8.7 - 10.3 mg/dL  CBC with Differential/Platelet  Result Value Ref Range   WBC 4.1 3.4 - 10.8 x10E3/uL   RBC 4.15 3.77 - 5.28 x10E6/uL   Hemoglobin 13.9 11.1 - 15.9 g/dL   Hematocrit 40.3 34.0 - 46.6 %   MCV 97 79 - 97 fL   MCH 33.5 (H) 26.6 - 33.0 pg   MCHC 34.5 31.5 - 35.7 g/dL   RDW 13.4 12.3 - 15.4 %   Platelets 245 150 - 379 x10E3/uL   Neutrophils 56 Not Estab. %   Lymphs 33 Not Estab. %   Monocytes 7 Not Estab. %   Eos 3 Not Estab. %   Basos 1 Not Estab. %   Neutrophils Absolute 2.3 1.4 - 7.0 x10E3/uL   Lymphocytes Absolute 1.4 0.7 - 3.1 x10E3/uL   Monocytes Absolute 0.3 0.1 - 0.9 x10E3/uL   EOS (ABSOLUTE) 0.1  0.0 - 0.4 x10E3/uL   Basophils Absolute 0.0 0.0 - 0.2 x10E3/uL   Immature Granulocytes 0 Not Estab. %   Immature Grans (Abs) 0.0 0.0 - 0.1 x10E3/uL      Assessment & Plan:   Problem List Items Addressed This Visit      Musculoskeletal and Integument   Osteoporosis    stable      Relevant Medications   VITAMIN A PO   VITAMIN E PO   Osteoarthritis of both knees    The current medical regimen is effective;  continue present plan and medications.         Other   Hypercholesteremia    Labs pending      Advanced care planning/counseling discussion    A voluntary discussion about advance care planning including the explanation and discussion of advance directives was extensively discussed  with the patient.  Explanation about the health care proxy and Living will was reviewed and packet with forms with explanation of how to fill them out was given.         Other Visit Diagnoses    PE (physical exam), annual    -  Primary       Follow up plan: Return in about 1 year (around 06/14/2018) for Physical Exam.

## 2017-06-14 NOTE — Assessment & Plan Note (Signed)
A voluntary discussion about advance care planning including the explanation and discussion of advance directives was extensively discussed  with the patient.  Explanation about the health care proxy and Living will was reviewed and packet with forms with explanation of how to fill them out was given.    

## 2017-06-14 NOTE — Assessment & Plan Note (Signed)
The current medical regimen is effective;  continue present plan and medications.  

## 2017-06-14 NOTE — Assessment & Plan Note (Signed)
stable °

## 2017-06-15 ENCOUNTER — Encounter: Payer: Self-pay | Admitting: Family Medicine

## 2017-06-15 LAB — COMPREHENSIVE METABOLIC PANEL
ALBUMIN: 5 g/dL — AB (ref 3.5–4.8)
ALK PHOS: 89 IU/L (ref 39–117)
ALT: 11 IU/L (ref 0–32)
AST: 21 IU/L (ref 0–40)
Albumin/Globulin Ratio: 2 (ref 1.2–2.2)
BUN / CREAT RATIO: 38 — AB (ref 12–28)
BUN: 19 mg/dL (ref 8–27)
Bilirubin Total: 0.8 mg/dL (ref 0.0–1.2)
CO2: 27 mmol/L (ref 20–29)
CREATININE: 0.5 mg/dL — AB (ref 0.57–1.00)
Calcium: 10.1 mg/dL (ref 8.7–10.3)
Chloride: 101 mmol/L (ref 96–106)
GFR calc non Af Amer: 95 mL/min/{1.73_m2} (ref >59)
GFR, EST AFRICAN AMERICAN: 109 mL/min/{1.73_m2} (ref >59)
GLUCOSE: 86 mg/dL (ref 65–99)
Globulin, Total: 2.5 g/dL (ref 1.5–4.5)
Potassium: 4.6 mmol/L (ref 3.5–5.2)
Sodium: 144 mmol/L (ref 134–144)
TOTAL PROTEIN: 7.5 g/dL (ref 6.0–8.5)

## 2017-06-15 LAB — CBC WITH DIFFERENTIAL/PLATELET
BASOS: 1 %
Basophils Absolute: 0 10*3/uL (ref 0.0–0.2)
EOS (ABSOLUTE): 0.1 10*3/uL (ref 0.0–0.4)
Eos: 3 %
HEMATOCRIT: 41.6 % (ref 34.0–46.6)
HEMOGLOBIN: 13.9 g/dL (ref 11.1–15.9)
IMMATURE GRANULOCYTES: 0 %
Immature Grans (Abs): 0 10*3/uL (ref 0.0–0.1)
LYMPHS ABS: 1.1 10*3/uL (ref 0.7–3.1)
Lymphs: 29 %
MCH: 32.6 pg (ref 26.6–33.0)
MCHC: 33.4 g/dL (ref 31.5–35.7)
MCV: 97 fL (ref 79–97)
MONOS ABS: 0.3 10*3/uL (ref 0.1–0.9)
Monocytes: 9 %
NEUTROS PCT: 58 %
Neutrophils Absolute: 2.1 10*3/uL (ref 1.4–7.0)
Platelets: 274 10*3/uL (ref 150–379)
RBC: 4.27 x10E6/uL (ref 3.77–5.28)
RDW: 13.5 % (ref 12.3–15.4)
WBC: 3.7 10*3/uL (ref 3.4–10.8)

## 2017-06-15 LAB — LIPID PANEL
CHOLESTEROL TOTAL: 216 mg/dL — AB (ref 100–199)
Chol/HDL Ratio: 2.6 ratio (ref 0.0–4.4)
HDL: 84 mg/dL (ref >39)
LDL CALC: 117 mg/dL — AB (ref 0–99)
Triglycerides: 76 mg/dL (ref 0–149)
VLDL CHOLESTEROL CAL: 15 mg/dL (ref 5–40)

## 2017-06-15 LAB — TSH: TSH: 1.53 u[IU]/mL (ref 0.450–4.500)

## 2017-07-26 ENCOUNTER — Ambulatory Visit
Admission: RE | Admit: 2017-07-26 | Discharge: 2017-07-26 | Disposition: A | Discharge status: Home / Self Care | Payer: Medicare Other | Source: Ambulatory Visit | Attending: Family Medicine | Admitting: Family Medicine

## 2017-07-26 DIAGNOSIS — Z1231 Encounter for screening mammogram for malignant neoplasm of breast: Secondary | ICD-10-CM | POA: Insufficient documentation

## 2018-03-21 IMAGING — MG MM DIGITAL SCREENING BILAT W/ TOMO W/ CAD
9 of 13 series · 9 of 29 positions shown · non-contrast
Comparison: Previous exam(s).

CLINICAL DATA: Screening.

EXAM:
2D DIGITAL SCREENING BILATERAL MAMMOGRAM WITH 3D TOMO WITH CAD

[R MLO (1 of 2)]
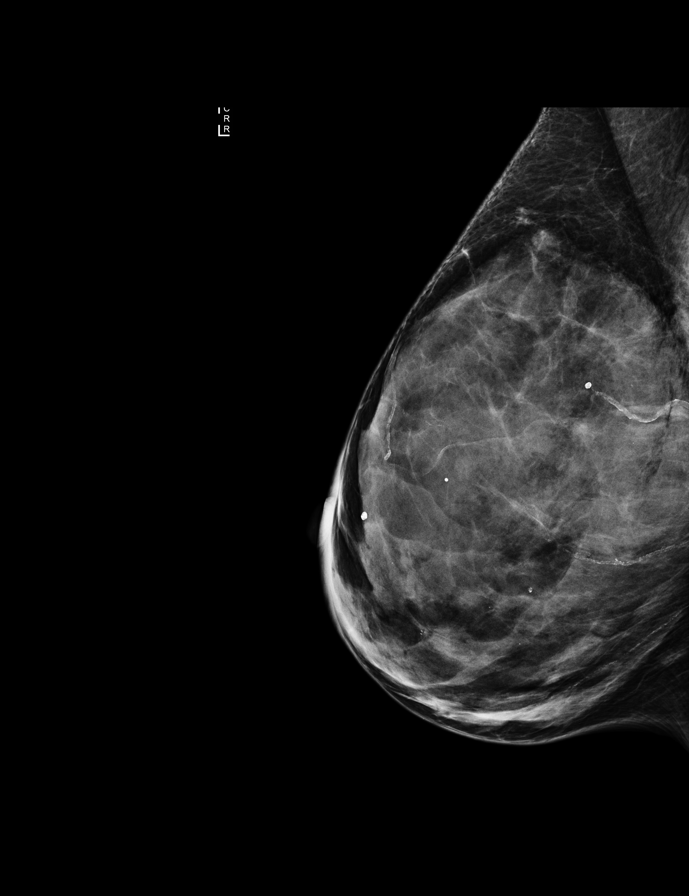

[R CC]
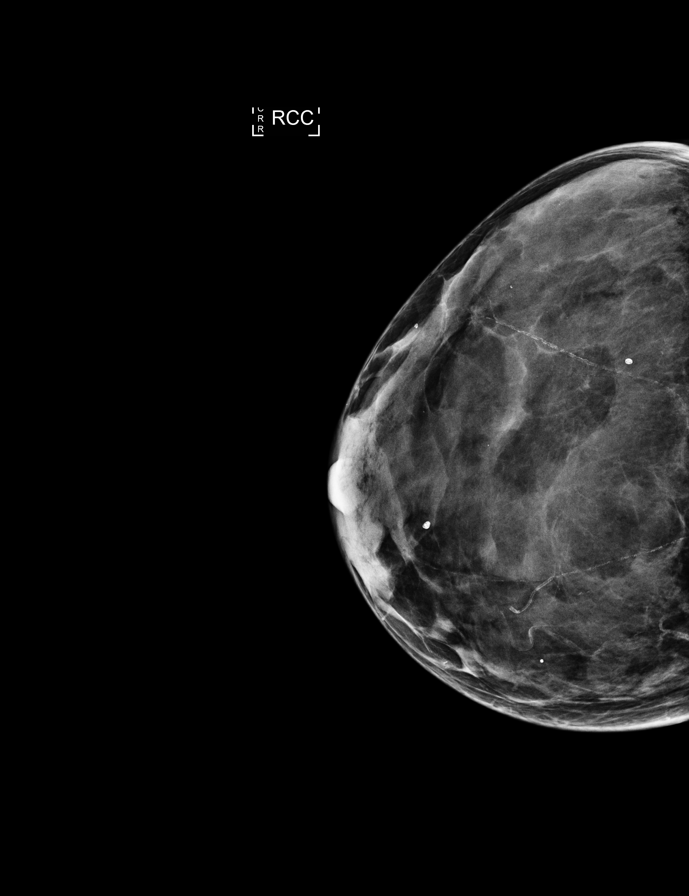

[R MLO synth-2D]
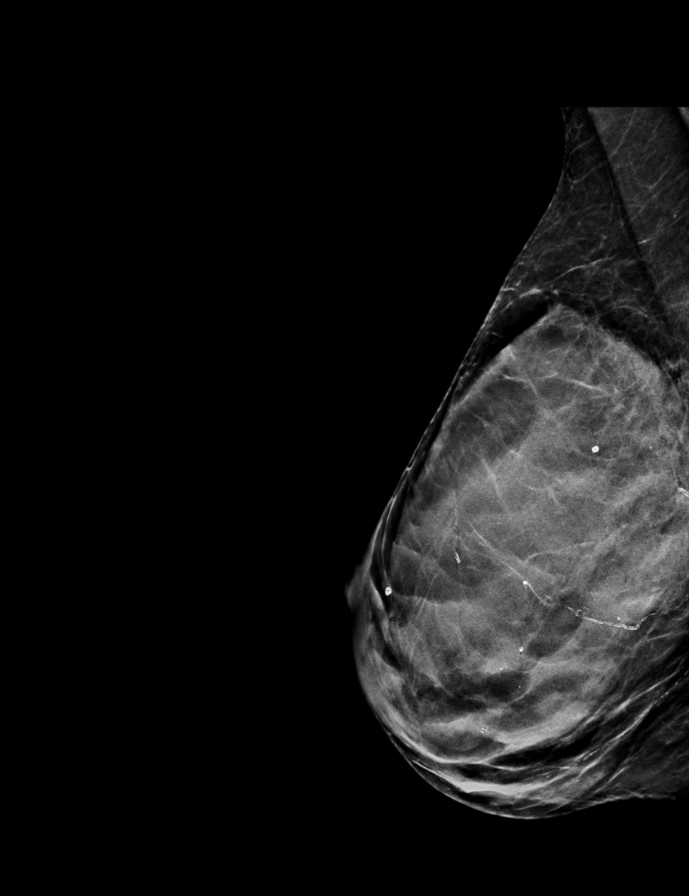

[L MLO]
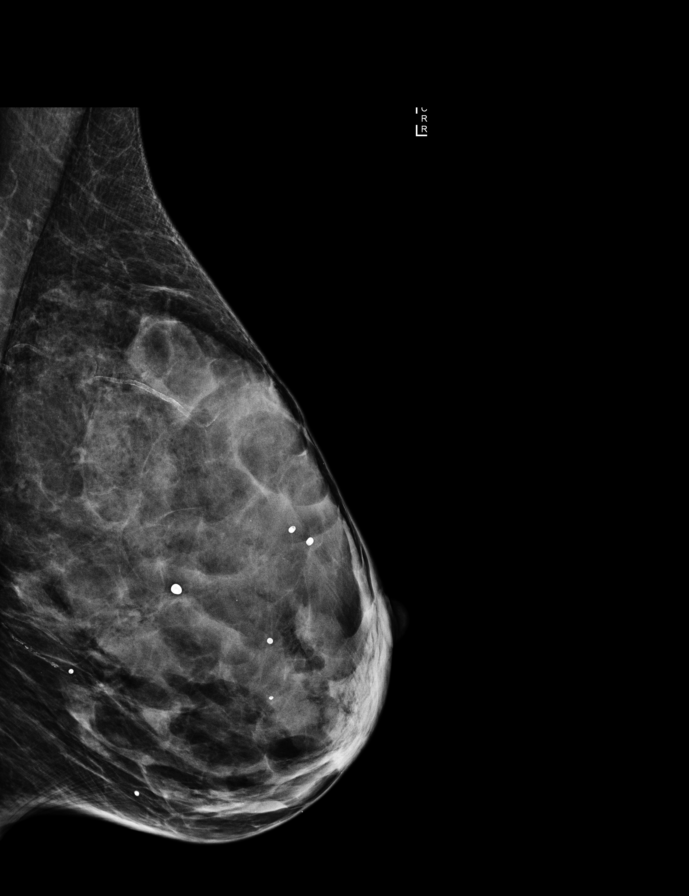

[R CC synth-2D]
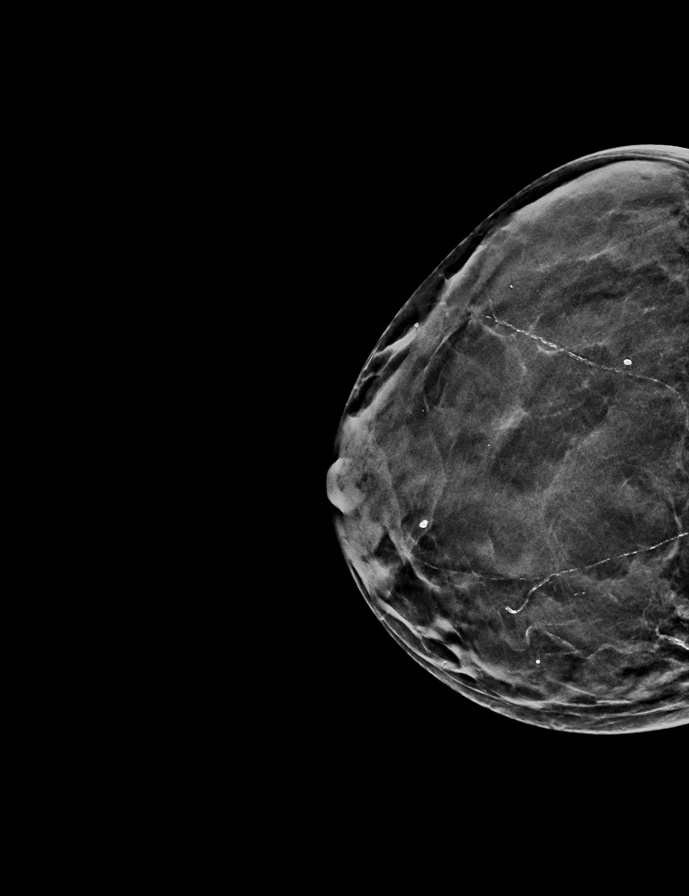

[L MLO synth-2D]
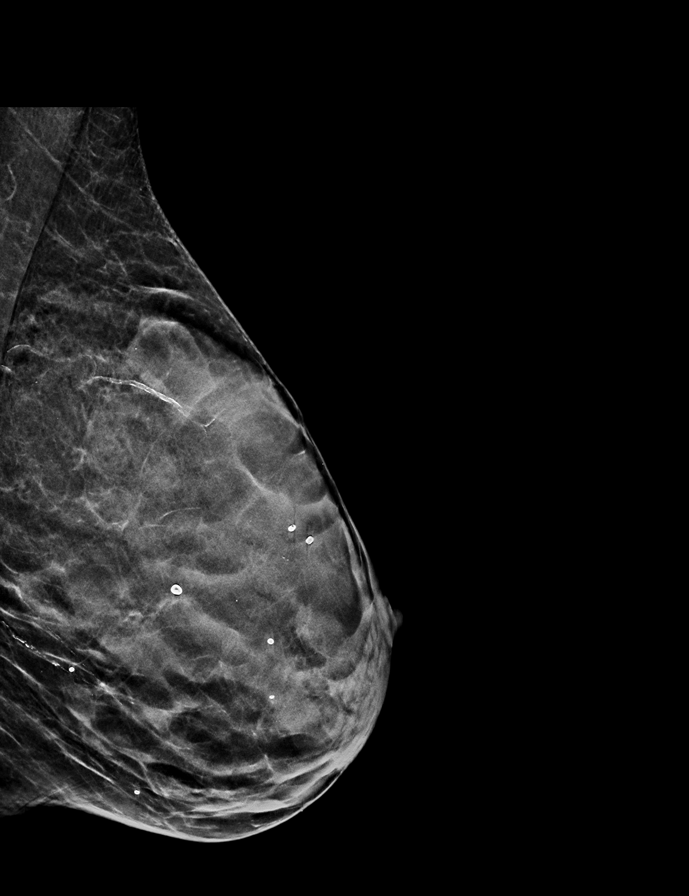

[R MLO (2 of 2)]
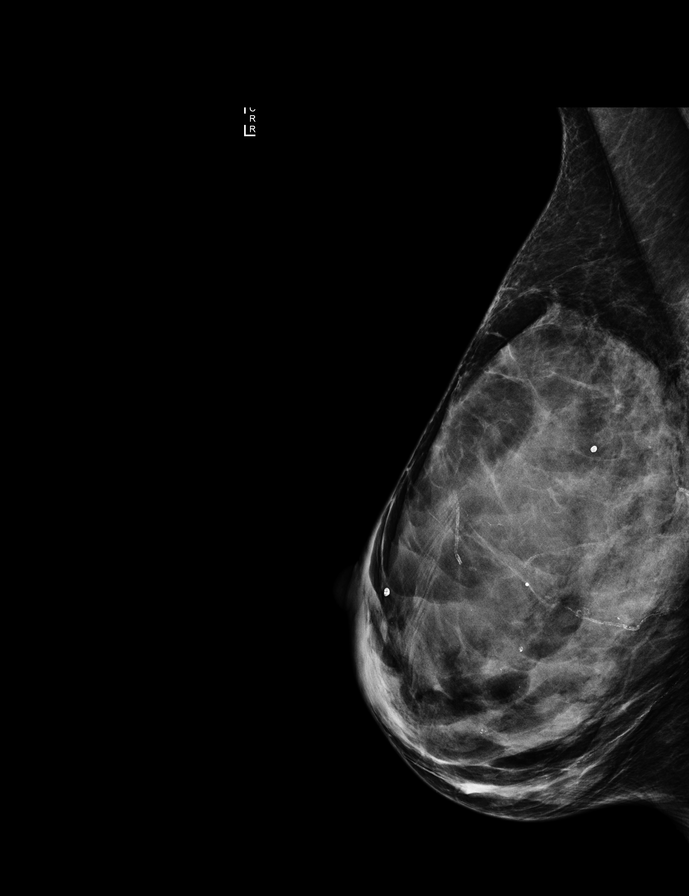

[L CC synth-2D]
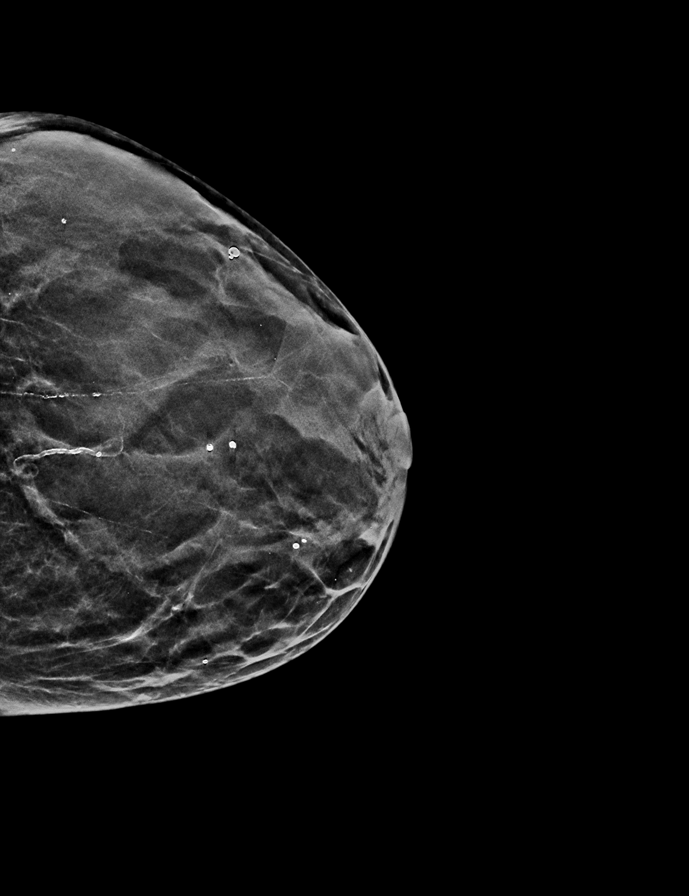

[L CC]
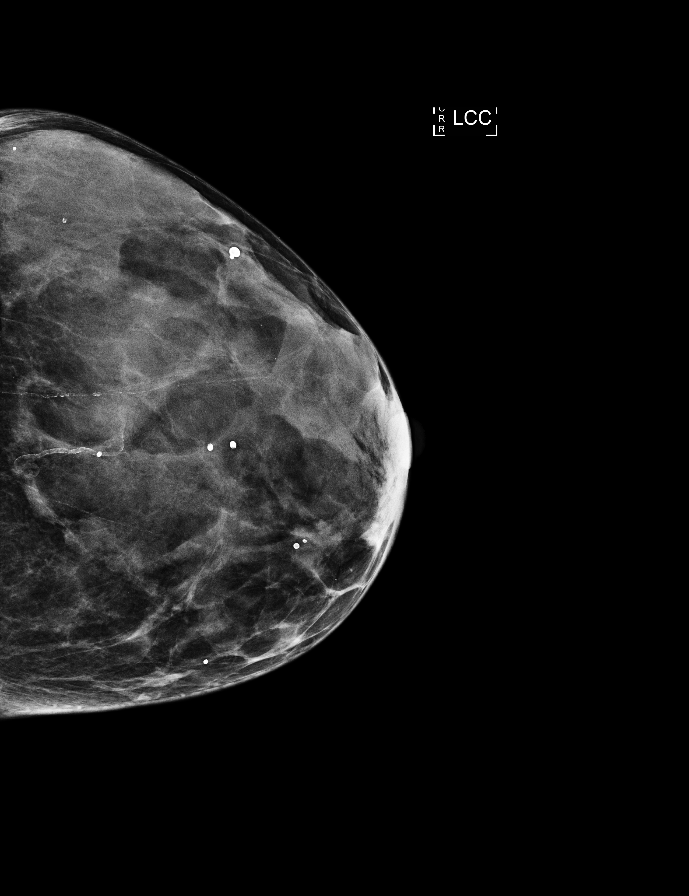

[9 of 29 positions shown; findings below may reference images not displayed]

ACR Breast Density Category d: The breast tissue is extremely dense,
which lowers the sensitivity of mammography.
FINDINGS: There are no findings suspicious for malignancy. Images were
processed with CAD.
IMPRESSION: No mammographic evidence of malignancy. A result letter of this
screening mammogram will be mailed directly to the patient.

RECOMMENDATION:
Screening mammogram in one year. (Code:L7-0-D4H)

BI-RADS CATEGORY  1: Negative.

## 2018-03-30 DIAGNOSIS — Z23 Encounter for immunization: Secondary | ICD-10-CM | POA: Diagnosis not present

## 2018-06-12 ENCOUNTER — Encounter: Payer: Self-pay | Admitting: Family Medicine

## 2018-06-12 ENCOUNTER — Ambulatory Visit (INDEPENDENT_AMBULATORY_CARE_PROVIDER_SITE_OTHER): Payer: Medicare Other | Admitting: Family Medicine

## 2018-06-12 ENCOUNTER — Other Ambulatory Visit: Payer: Self-pay | Admitting: Family Medicine

## 2018-06-12 VITALS — BP 126/79 | HR 77 | Temp 98.4°F | Ht 64.96 in | Wt 114.0 lb

## 2018-06-12 DIAGNOSIS — G4762 Sleep related leg cramps: Secondary | ICD-10-CM | POA: Insufficient documentation

## 2018-06-12 DIAGNOSIS — Z1329 Encounter for screening for other suspected endocrine disorder: Secondary | ICD-10-CM

## 2018-06-12 DIAGNOSIS — Z1239 Encounter for other screening for malignant neoplasm of breast: Secondary | ICD-10-CM

## 2018-06-12 DIAGNOSIS — E7800 Pure hypercholesterolemia, unspecified: Secondary | ICD-10-CM

## 2018-06-12 DIAGNOSIS — Z0000 Encounter for general adult medical examination without abnormal findings: Secondary | ICD-10-CM

## 2018-06-12 DIAGNOSIS — Z7189 Other specified counseling: Secondary | ICD-10-CM

## 2018-06-12 DIAGNOSIS — M810 Age-related osteoporosis without current pathological fracture: Secondary | ICD-10-CM

## 2018-06-12 DIAGNOSIS — Z1231 Encounter for screening mammogram for malignant neoplasm of breast: Secondary | ICD-10-CM

## 2018-06-12 LAB — URINALYSIS, ROUTINE W REFLEX MICROSCOPIC
BILIRUBIN UA: NEGATIVE
Glucose, UA: NEGATIVE
Ketones, UA: NEGATIVE
NITRITE UA: NEGATIVE
Protein, UA: NEGATIVE
Specific Gravity, UA: 1.02 (ref 1.005–1.030)
UUROB: 1 mg/dL (ref 0.2–1.0)
pH, UA: 6 (ref 5.0–7.5)

## 2018-06-12 LAB — MICROSCOPIC EXAMINATION

## 2018-06-12 NOTE — Addendum Note (Signed)
Addended by: Gerda Diss A on: 06/12/2018 10:22 AM   Modules accepted: Orders

## 2018-06-12 NOTE — Assessment & Plan Note (Signed)
Discussed leg Cramps care and treatment with stretching massage

## 2018-06-12 NOTE — Progress Notes (Signed)
BP 126/79   Pulse 77   Temp 98.4 F (36.9 C) (Oral)   Ht 5' 4.96" (1.65 m)   Wt 114 lb (51.7 kg)   SpO2 94%   BMI 18.99 kg/m    Subjective:    Patient ID: Amanda Bruce, female    DOB: Feb 21, 1941, 77 y.o.   MRN: [id number]  HPI: Amanda Bruce is a 77 y.o. female  Chief Complaint  Patient presents with  . Annual Exam  . Muscle cramps    Left thigh mostly  . Toe Injury    Left foot "pointer" toe.   Marland Kitchen Urinary Frequency  Patient follow-up all in all doing well has some left hamstring muscle cramps that come on especially after dancing.  Happens in bed not with activity. Toe also is a little bit sore and has some urinary frequency with no other UTI type symptoms no hematuria. Takes over-the-counter vitamins.   Relevant past medical, surgical, family and social history reviewed and updated as indicated. Interim medical history since our last visit reviewed. Allergies and medications reviewed and updated.  Review of Systems  Constitutional: Negative.   HENT: Negative.   Eyes: Negative.   Respiratory: Negative.   Cardiovascular: Negative.   Gastrointestinal: Negative.   Endocrine: Negative.   Genitourinary: Negative.   Musculoskeletal: Negative.   Skin: Negative.   Allergic/Immunologic: Negative.   Neurological: Negative.   Hematological: Negative.   Psychiatric/Behavioral: Negative.     Per HPI unless specifically indicated above     Objective:    BP 126/79   Pulse 77   Temp 98.4 F (36.9 C) (Oral)   Ht 5' 4.96" (1.65 m)   Wt 114 lb (51.7 kg)   SpO2 94%   BMI 18.99 kg/m   Wt Readings from Last 3 Encounters:  06/12/18 114 lb (51.7 kg)  06/14/17 112 lb 9.6 oz (51.1 kg)  12/07/16 115 lb (52.2 kg)    Physical Exam Constitutional:      Appearance: She is well-developed.  HENT:     Head: Normocephalic and atraumatic.     Right Ear: External ear normal.     Left Ear: External ear normal.     Nose: Nose normal.  Eyes:     Conjunctiva/sclera:  Conjunctivae normal.     Pupils: Pupils are equal, round, and reactive to light.  Neck:     Musculoskeletal: Normal range of motion and neck supple.     Vascular: No carotid bruit.  Cardiovascular:     Rate and Rhythm: Normal rate and regular rhythm.     Heart sounds: Normal heart sounds. No murmur.  Pulmonary:     Effort: Pulmonary effort is normal.     Breath sounds: Normal breath sounds.  Chest:     Chest wall: No mass.     Breasts: Breasts are symmetrical.        Right: No mass, skin change or tenderness.        Left: No mass, skin change or tenderness.  Abdominal:     General: Bowel sounds are normal.     Palpations: Abdomen is soft.  Musculoskeletal: Normal range of motion.  Skin:    Findings: No rash.  Neurological:     Mental Status: She is alert and oriented to person, place, and time.  Psychiatric:        Behavior: Behavior normal.        Thought Content: Thought content normal.        Judgment: Judgment  normal.     Results for orders placed or performed in visit on 06/14/17  Microscopic Examination  Result Value Ref Range   WBC, UA 6-10 (A) 0 - 5 /hpf   RBC, UA 0-2 0 - 2 /hpf   Epithelial Cells (non renal) 0-10 0 - 10 /hpf   Mucus, UA Present Not Estab.   Bacteria, UA Few None seen/Few  Comprehensive metabolic panel  Result Value Ref Range   Glucose 86 65 - 99 mg/dL   BUN 19 8 - 27 mg/dL   Creatinine, Ser 0.50 (L) 0.57 - 1.00 mg/dL   GFR calc non Af Amer 95 >59 mL/min/1.73   GFR calc Af Amer 109 >59 mL/min/1.73   BUN/Creatinine Ratio 38 (H) 12 - 28   Sodium 144 134 - 144 mmol/L   Potassium 4.6 3.5 - 5.2 mmol/L   Chloride 101 96 - 106 mmol/L   CO2 27 20 - 29 mmol/L   Calcium 10.1 8.7 - 10.3 mg/dL   Total Protein 7.5 6.0 - 8.5 g/dL   Albumin 5.0 (H) 3.5 - 4.8 g/dL   Globulin, Total 2.5 1.5 - 4.5 g/dL   Albumin/Globulin Ratio 2.0 1.2 - 2.2   Bilirubin Total 0.8 0.0 - 1.2 mg/dL   Alkaline Phosphatase 89 39 - 117 IU/L   AST 21 0 - 40 IU/L   ALT 11 0 -  32 IU/L  Lipid panel  Result Value Ref Range   Cholesterol, Total 216 (H) 100 - 199 mg/dL   Triglycerides 76 0 - 149 mg/dL   HDL 84 >39 mg/dL   VLDL Cholesterol Cal 15 5 - 40 mg/dL   LDL Calculated 117 (H) 0 - 99 mg/dL   Chol/HDL Ratio 2.6 0.0 - 4.4 ratio  CBC with Differential/Platelet  Result Value Ref Range   WBC 3.7 3.4 - 10.8 x10E3/uL   RBC 4.27 3.77 - 5.28 x10E6/uL   Hemoglobin 13.9 11.1 - 15.9 g/dL   Hematocrit 41.6 34.0 - 46.6 %   MCV 97 79 - 97 fL   MCH 32.6 26.6 - 33.0 pg   MCHC 33.4 31.5 - 35.7 g/dL   RDW 13.5 12.3 - 15.4 %   Platelets 274 150 - 379 x10E3/uL   Neutrophils 58 Not Estab. %   Lymphs 29 Not Estab. %   Monocytes 9 Not Estab. %   Eos 3 Not Estab. %   Basos 1 Not Estab. %   Neutrophils Absolute 2.1 1.4 - 7.0 x10E3/uL   Lymphocytes Absolute 1.1 0.7 - 3.1 x10E3/uL   Monocytes Absolute 0.3 0.1 - 0.9 x10E3/uL   EOS (ABSOLUTE) 0.1 0.0 - 0.4 x10E3/uL   Basophils Absolute 0.0 0.0 - 0.2 x10E3/uL   Immature Granulocytes 0 Not Estab. %   Immature Grans (Abs) 0.0 0.0 - 0.1 x10E3/uL   Hematology Comments: Note:   TSH  Result Value Ref Range   TSH 1.530 0.450 - 4.500 uIU/mL  Urinalysis, Routine w reflex microscopic  Result Value Ref Range   Specific Gravity, UA 1.020 1.005 - 1.030   pH, UA 6.5 5.0 - 7.5   Color, UA Yellow Yellow   Appearance Ur Clear Clear   Leukocytes, UA 2+ (A) Negative   Protein, UA Negative Negative/Trace   Glucose, UA Negative Negative   Ketones, UA Negative Negative   RBC, UA Trace (A) Negative   Bilirubin, UA Negative Negative   Urobilinogen, Ur 1.0 0.2 - 1.0 mg/dL   Nitrite, UA Negative Negative   Microscopic Examination See below:  Assessment & Plan:   Problem List Items Addressed This Visit      Musculoskeletal and Integument   Osteoporosis    The current medical regimen is effective;  continue present plan and medications.       Relevant Orders   DG Bone Density     Other   Hypercholesteremia - Primary    Relevant Orders   CBC with Differential/Platelet   Comprehensive metabolic panel   Lipid panel   Urinalysis, Routine w reflex microscopic   Advanced care planning/counseling discussion    A voluntary discussion about advanced care planning including explanation and discussion of advanced directives was extentively discussed with the patient.  Explained about the healthcare proxy and living will was reviewed and packet with forms with expiration of how to fill them out was given.  Time spent: Encounter 16+ min individuals present: Patient      Leg cramps, sleep related    Discussed leg Cramps care and treatment with stretching massage       Other Visit Diagnoses    Thyroid disorder screen       Relevant Orders   TSH       Follow up plan: Return in about 1 year (around 06/13/2019) for Physical Exam.

## 2018-06-12 NOTE — Assessment & Plan Note (Signed)
The current medical regimen is effective;  continue present plan and medications.  

## 2018-06-12 NOTE — Assessment & Plan Note (Signed)
A voluntary discussion about advanced care planning including explanation and discussion of advanced directives was extentively discussed with the patient.  Explained about the healthcare proxy and living will was reviewed and packet with forms with expiration of how to fill them out was given.  Time spent: Encounter 16+ min individuals present: Patient 

## 2018-06-13 ENCOUNTER — Encounter: Payer: Self-pay | Admitting: Family Medicine

## 2018-06-13 LAB — CBC WITH DIFFERENTIAL/PLATELET
Basophils Absolute: 0 10*3/uL (ref 0.0–0.2)
Basos: 1 %
EOS (ABSOLUTE): 0.1 10*3/uL (ref 0.0–0.4)
EOS: 3 %
Hematocrit: 38.4 % (ref 34.0–46.6)
Hemoglobin: 12.8 g/dL (ref 11.1–15.9)
IMMATURE GRANS (ABS): 0 10*3/uL (ref 0.0–0.1)
Immature Granulocytes: 0 %
Lymphocytes Absolute: 1.2 10*3/uL (ref 0.7–3.1)
Lymphs: 25 %
MCH: 31.4 pg (ref 26.6–33.0)
MCHC: 33.3 g/dL (ref 31.5–35.7)
MCV: 94 fL (ref 79–97)
Monocytes Absolute: 0.4 10*3/uL (ref 0.1–0.9)
Monocytes: 8 %
Neutrophils Absolute: 3 10*3/uL (ref 1.4–7.0)
Neutrophils: 63 %
Platelets: 287 10*3/uL (ref 150–450)
RBC: 4.08 x10E6/uL (ref 3.77–5.28)
RDW: 12.2 % — ABNORMAL LOW (ref 12.3–15.4)
WBC: 4.8 10*3/uL (ref 3.4–10.8)

## 2018-06-13 LAB — TSH: TSH: 1.3 u[IU]/mL (ref 0.450–4.500)

## 2018-06-13 LAB — COMPREHENSIVE METABOLIC PANEL
ALT: 9 IU/L (ref 0–32)
AST: 16 IU/L (ref 0–40)
Albumin/Globulin Ratio: 1.9 (ref 1.2–2.2)
Albumin: 4.5 g/dL (ref 3.5–4.8)
Alkaline Phosphatase: 103 IU/L (ref 39–117)
BUN/Creatinine Ratio: 34 — ABNORMAL HIGH (ref 12–28)
BUN: 21 mg/dL (ref 8–27)
Bilirubin Total: 0.6 mg/dL (ref 0.0–1.2)
CO2: 25 mmol/L (ref 20–29)
Calcium: 9.5 mg/dL (ref 8.7–10.3)
Chloride: 100 mmol/L (ref 96–106)
Creatinine, Ser: 0.61 mg/dL (ref 0.57–1.00)
GFR calc non Af Amer: 88 mL/min/{1.73_m2} (ref >59)
GFR, EST AFRICAN AMERICAN: 102 mL/min/{1.73_m2} (ref >59)
Globulin, Total: 2.4 g/dL (ref 1.5–4.5)
Glucose: 79 mg/dL (ref 65–99)
Potassium: 4.4 mmol/L (ref 3.5–5.2)
Sodium: 142 mmol/L (ref 134–144)
Total Protein: 6.9 g/dL (ref 6.0–8.5)

## 2018-06-13 LAB — LIPID PANEL
Chol/HDL Ratio: 2.7 ratio (ref 0.0–4.4)
Cholesterol, Total: 219 mg/dL — ABNORMAL HIGH (ref 100–199)
HDL: 80 mg/dL (ref >39)
LDL CALC: 124 mg/dL — AB (ref 0–99)
Triglycerides: 75 mg/dL (ref 0–149)
VLDL CHOLESTEROL CAL: 15 mg/dL (ref 5–40)

## 2018-06-19 ENCOUNTER — Encounter: Payer: Medicare Other | Admitting: Family Medicine

## 2018-07-31 ENCOUNTER — Ambulatory Visit
Admission: RE | Admit: 2018-07-31 | Discharge: 2018-07-31 | Disposition: A | Discharge status: Home / Self Care | Payer: Medicare Other | Source: Ambulatory Visit | Attending: Family Medicine | Admitting: Family Medicine

## 2018-07-31 DIAGNOSIS — Z1231 Encounter for screening mammogram for malignant neoplasm of breast: Secondary | ICD-10-CM

## 2018-07-31 DIAGNOSIS — M810 Age-related osteoporosis without current pathological fracture: Secondary | ICD-10-CM | POA: Insufficient documentation

## 2018-08-03 DIAGNOSIS — X32XXXA Exposure to sunlight, initial encounter: Secondary | ICD-10-CM | POA: Diagnosis not present

## 2018-08-03 DIAGNOSIS — L570 Actinic keratosis: Secondary | ICD-10-CM | POA: Diagnosis not present

## 2018-08-03 DIAGNOSIS — L814 Other melanin hyperpigmentation: Secondary | ICD-10-CM | POA: Diagnosis not present

## 2018-08-11 ENCOUNTER — Other Ambulatory Visit: Payer: Self-pay | Admitting: Family Medicine

## 2018-08-11 NOTE — Telephone Encounter (Signed)
Requested Prescriptions  Pending Prescriptions Disp Refills  . tretinoin (RETIN-A) 0.05 % cream [Pharmacy Med Name: TRETINOIN 0.05% CREAM] 45 g 0    Sig: Apply topically at bedtime.     Dermatology:  Acne preparations Passed - 08/11/2018  2:10 PM      Passed - Valid encounter within last 12 months    Recent Outpatient Visits          2 months ago Hypercholesteremia   [facility] Crissman, Jeannette How, MD   1 year ago PE (physical exam), annual   [facility] Crissman, Jeannette How, MD   1 year ago Palpitations   [facility] Crissman, Jeannette How, MD   2 years ago PE (physical exam), annual   [facility] Crissman, Jeannette How, MD   3 years ago Cough   [place], [place], DO      Future Appointments            In 10 months Crissman, Jeannette How, MD [facility], [facility]

## 2018-08-14 ENCOUNTER — Telehealth: Payer: Self-pay

## 2018-08-14 NOTE — Telephone Encounter (Signed)
Patient insurance is calling to state the Tretinoin 0.05% cream has been denied. Due to not covered under the patient insurance for cosmetic purpose. Please advise

## 2018-08-14 NOTE — Telephone Encounter (Signed)
Prior authorization for Tretinoin 0.05% cream was initiated via [email or web address]. Key: [id number]

## 2018-08-14 NOTE — Telephone Encounter (Signed)
Please advise 

## 2018-08-16 NOTE — Telephone Encounter (Signed)
This will need to wait until Dr. Rance Muir return

## 2018-08-28 NOTE — Telephone Encounter (Signed)
Patient is calling back for [facility]

## 2018-08-28 NOTE — Telephone Encounter (Signed)
Left message on machine for pt to return call to the office.  

## 2018-08-28 NOTE — Telephone Encounter (Signed)
Message relayed to patient. Verbalized understanding and denied questions. Pt stated she would pay out of pocket for medication if not too expensive.

## 2018-08-28 NOTE — Telephone Encounter (Signed)
Call pt Tell patient to buy this over-the-counter

## 2018-08-28 NOTE — Telephone Encounter (Signed)
Patient returned call from [place]. Please advise

## 2019-04-02 ENCOUNTER — Telehealth: Payer: Self-pay | Admitting: Family Medicine

## 2019-04-02 NOTE — Telephone Encounter (Signed)
OK to do protocol for new physician.   FYI to Alwyn Ren- this is not a new patient, it's a Dr. Jeananne Rama patient.

## 2019-04-02 NOTE — Telephone Encounter (Signed)
Copied from [facility] [id number]. Topic: Appointment Scheduling - Prior Auth Required for Appointment >> Apr 02, 2019  9:00 AM Richardo Priest, NT wrote: No appointment has been scheduled. Patient is requesting new patient appointment/ physical. Per scheduling protocol, this appointment requires a prior authorization prior to scheduling.  Patient was told Dr.Johnson is not accepting at this time and was offered [employer]. Patient does not want to be seen by an NP and would like an MD. Please advise as patient is wanting to be seen in office still as she has been going there for many years.   Route to department's PEC pool.

## 2019-04-04 NOTE — Telephone Encounter (Signed)
Scheduled patient to have her CPE with you in December.  Patient only wants to see a doctor.  I advised patient that you will see her until new doctor starts. Patient mentioned possibly staying with you and I informed her that you currently are not accepting new patients.

## 2019-04-06 DIAGNOSIS — Z23 Encounter for immunization: Secondary | ICD-10-CM | POA: Diagnosis not present

## 2019-05-10 ENCOUNTER — Other Ambulatory Visit: Payer: Self-pay

## 2019-05-10 ENCOUNTER — Ambulatory Visit (INDEPENDENT_AMBULATORY_CARE_PROVIDER_SITE_OTHER): Payer: Medicare Other | Admitting: Family Medicine

## 2019-05-10 ENCOUNTER — Encounter: Payer: Self-pay | Admitting: Family Medicine

## 2019-05-10 VITALS — Ht 67.0 in | Wt 116.0 lb

## 2019-05-10 DIAGNOSIS — R05 Cough: Secondary | ICD-10-CM

## 2019-05-10 DIAGNOSIS — R059 Cough, unspecified: Secondary | ICD-10-CM

## 2019-05-10 MED ORDER — AZITHROMYCIN 250 MG PO TABS: ORAL_TABLET | ORAL | 0 refills | Status: DC

## 2019-05-10 NOTE — Progress Notes (Signed)
Ht 5\' 7"  (1.702 m)   Wt 116 lb (52.6 kg)   BMI 18.17 kg/m    Subjective:    Patient ID: Amanda Bruce, female    DOB: 05/20/1941, 78 y.o.   MRN: [id number]  HPI: Amanda Bruce is a 78 y.o. female  Chief Complaint  Patient presents with  . Cough    sweatting , loss of appetite. symptoms started about a week ago. covid 19 exposure  . Fatigue    . This visit was completed via WebEx due to the restrictions of the COVID-19 pandemic. All issues as above were discussed and addressed. Physical exam was done as above through visual confirmation on WebEx. If it was felt that the patient should be evaluated in the office, they were directed there. The patient verbally consented to this visit. . Location of the patient: home . Location of the provider: work . Those involved with this call:  . Provider: Merrie Roof, PA-C . CMA: Lesle Chris, [place] . Front Desk/Registration: Jill Side  . Time spent on call: 15 minutes with patient face to face via video conference. More than 50% of this time was spent in counseling and coordination of care. 5 minutes total spent in review of patient's record and preparation of their chart. I verified patient identity using two factors (patient name and date of birth). Patient consents verbally to being seen via telemedicine visit today.   About a week of hacking cough, chills, sweats, fatigue. Denies fever, chills, CP, loss of taste or smell. Taking tylenol and OTC sinus medications without much relief. Has not been around anyone who is sick but did go to a Halloween party recently and just found out someone there did test positive for COVID 19.   Relevant past medical, surgical, family and social history reviewed and updated as indicated. Interim medical history since our last visit reviewed. Allergies and medications reviewed and updated.  Review of Systems  Per HPI unless specifically indicated above     Objective:    Ht 5\' 7"  (1.702 m)    Wt 116 lb (52.6 kg)   BMI 18.17 kg/m   Wt Readings from Last 3 Encounters:  05/10/19 116 lb (52.6 kg)  06/12/18 114 lb (51.7 kg)  06/14/17 112 lb 9.6 oz (51.1 kg)    Physical Exam Vitals signs and nursing note reviewed.  Constitutional:      General: She is not in acute distress.    Appearance: Normal appearance.  HENT:     Head: Atraumatic.     Right Ear: External ear normal.     Left Ear: External ear normal.     Nose: Rhinorrhea present.     Mouth/Throat:     Mouth: Mucous membranes are moist.     Pharynx: Oropharynx is clear. Posterior oropharyngeal erythema present.  Eyes:     Extraocular Movements: Extraocular movements intact.     Conjunctiva/sclera: Conjunctivae normal.  Neck:     Musculoskeletal: Normal range of motion.  Cardiovascular:     Comments: Unable to assess via virtual visit Pulmonary:     Effort: Pulmonary effort is normal. No respiratory distress.  Musculoskeletal: Normal range of motion.  Skin:    General: Skin is dry.     Findings: No erythema.  Neurological:     Mental Status: She is alert and oriented to person, place, and time.  Psychiatric:        Mood and Affect: Mood normal.  Thought Content: Thought content normal.        Judgment: Judgment normal.     Results for orders placed or performed in visit on 06/12/18  Microscopic Examination   URINE  Result Value Ref Range   WBC, UA 6-10 (A) 0 - 5 /hpf   RBC, UA 0-2 0 - 2 /hpf   Epithelial Cells (non renal) 0-10 0 - 10 /hpf   Renal Epithel, UA 0-10 (A) None seen /hpf   Bacteria, UA Few None seen/Few  CBC with Differential/Platelet  Result Value Ref Range   WBC 4.8 3.4 - 10.8 x10E3/uL   RBC 4.08 3.77 - 5.28 x10E6/uL   Hemoglobin 12.8 11.1 - 15.9 g/dL   Hematocrit 38.4 34.0 - 46.6 %   MCV 94 79 - 97 fL   MCH 31.4 26.6 - 33.0 pg   MCHC 33.3 31.5 - 35.7 g/dL   RDW 12.2 (L) 12.3 - 15.4 %   Platelets 287 150 - 450 x10E3/uL   Neutrophils 63 Not Estab. %   Lymphs 25 Not Estab. %    Monocytes 8 Not Estab. %   Eos 3 Not Estab. %   Basos 1 Not Estab. %   Neutrophils Absolute 3.0 1.4 - 7.0 x10E3/uL   Lymphocytes Absolute 1.2 0.7 - 3.1 x10E3/uL   Monocytes Absolute 0.4 0.1 - 0.9 x10E3/uL   EOS (ABSOLUTE) 0.1 0.0 - 0.4 x10E3/uL   Basophils Absolute 0.0 0.0 - 0.2 x10E3/uL   Immature Granulocytes 0 Not Estab. %   Immature Grans (Abs) 0.0 0.0 - 0.1 x10E3/uL  Comprehensive metabolic panel  Result Value Ref Range   Glucose 79 65 - 99 mg/dL   BUN 21 8 - 27 mg/dL   Creatinine, Ser 0.61 0.57 - 1.00 mg/dL   GFR calc non Af Amer 88 >59 mL/min/1.73   GFR calc Af Amer 102 >59 mL/min/1.73   BUN/Creatinine Ratio 34 (H) 12 - 28   Sodium 142 134 - 144 mmol/L   Potassium 4.4 3.5 - 5.2 mmol/L   Chloride 100 96 - 106 mmol/L   CO2 25 20 - 29 mmol/L   Calcium 9.5 8.7 - 10.3 mg/dL   Total Protein 6.9 6.0 - 8.5 g/dL   Albumin 4.5 3.5 - 4.8 g/dL   Globulin, Total 2.4 1.5 - 4.5 g/dL   Albumin/Globulin Ratio 1.9 1.2 - 2.2   Bilirubin Total 0.6 0.0 - 1.2 mg/dL   Alkaline Phosphatase 103 39 - 117 IU/L   AST 16 0 - 40 IU/L   ALT 9 0 - 32 IU/L  Lipid panel  Result Value Ref Range   Cholesterol, Total 219 (H) 100 - 199 mg/dL   Triglycerides 75 0 - 149 mg/dL   HDL 80 >39 mg/dL   VLDL Cholesterol Cal 15 5 - 40 mg/dL   LDL Calculated 124 (H) 0 - 99 mg/dL   Chol/HDL Ratio 2.7 0.0 - 4.4 ratio  TSH  Result Value Ref Range   TSH 1.300 0.450 - 4.500 uIU/mL  Urinalysis, Routine w reflex microscopic  Result Value Ref Range   Specific Gravity, UA 1.020 1.005 - 1.030   pH, UA 6.0 5.0 - 7.5   Color, UA Yellow Yellow   Appearance Ur Clear Clear   Leukocytes, UA Trace (A) Negative   Protein, UA Negative Negative/Trace   Glucose, UA Negative Negative   Ketones, UA Negative Negative   RBC, UA 1+ (A) Negative   Bilirubin, UA Negative Negative   Urobilinogen, Ur 1.0 0.2 - 1.0  mg/dL   Nitrite, UA Negative Negative   Microscopic Examination See below:       Assessment & Plan:   Problem List  Items Addressed This Visit    None    Visit Diagnoses    Cough    -  Primary   Refer for COVID testing, quarantinue until results. Tx with zpak, mucinex, otc supportive care. F/u if not improving   Relevant Orders   Novel Coronavirus, NAA ([facility])       Follow up plan: Return if symptoms worsen or fail to improve.

## 2019-05-11 ENCOUNTER — Telehealth: Payer: Self-pay

## 2019-05-11 NOTE — Telephone Encounter (Signed)
Patient's son notified.

## 2019-05-11 NOTE — Telephone Encounter (Signed)
Copied from [place] [phone]. Topic: General - Inquiry >> May 10, 2019 12:34 PM Richardo Priest, [place] wrote: Reason for CRM: PT's son called in stating he is wondering if PCP can let them know if mother can take zinc gluconate as well with current script given today. Please advise. >> May 10, 2019  4:09 PM Richardo Priest, [place] wrote: Pt's son called again to check on status. Please advise.    Routing to provider to advise.

## 2019-05-11 NOTE — Telephone Encounter (Signed)
Should be just fine

## 2019-05-14 ENCOUNTER — Telehealth: Payer: Self-pay

## 2019-05-14 NOTE — Telephone Encounter (Signed)
Copied from [place] [phone]. Topic: General - Other >> May 14, 2019  4:27 PM Wynetta Emery, [place] C wrote: Reason for CRM: pt's son called in to make provider aware that pt tested positive. Pt took her last pill today that she was prescribed. Pt says that she is feeling a little better.   Please advised   CB: [phone]   Routing to provider who saw the patient.

## 2019-05-15 NOTE — Telephone Encounter (Signed)
Patient notified. Patient stated that she is feeling better and she will let us know if she needs anything

## 2019-05-15 NOTE — Telephone Encounter (Signed)
Noted, she should let us know if she does not continue improving or if she needs anything

## 2019-06-14 ENCOUNTER — Other Ambulatory Visit: Payer: Self-pay

## 2019-06-14 ENCOUNTER — Ambulatory Visit (INDEPENDENT_AMBULATORY_CARE_PROVIDER_SITE_OTHER): Payer: Medicare Other | Admitting: Family Medicine

## 2019-06-14 ENCOUNTER — Encounter: Payer: Self-pay | Admitting: Family Medicine

## 2019-06-14 ENCOUNTER — Ambulatory Visit: Payer: Medicare Other

## 2019-06-14 VITALS — BP 137/75 | HR 72 | Temp 99.5°F | Ht 65.0 in | Wt 113.8 lb

## 2019-06-14 DIAGNOSIS — M810 Age-related osteoporosis without current pathological fracture: Secondary | ICD-10-CM | POA: Diagnosis not present

## 2019-06-14 DIAGNOSIS — Z0000 Encounter for general adult medical examination without abnormal findings: Secondary | ICD-10-CM | POA: Diagnosis not present

## 2019-06-14 DIAGNOSIS — E7800 Pure hypercholesterolemia, unspecified: Secondary | ICD-10-CM

## 2019-06-14 DIAGNOSIS — R413 Other amnesia: Secondary | ICD-10-CM

## 2019-06-14 DIAGNOSIS — Z87440 Personal history of urinary (tract) infections: Secondary | ICD-10-CM | POA: Diagnosis not present

## 2019-06-14 LAB — MICROSCOPIC EXAMINATION

## 2019-06-14 LAB — UA/M W/RFLX CULTURE, ROUTINE
Bilirubin, UA: NEGATIVE
Glucose, UA: NEGATIVE
Ketones, UA: NEGATIVE
Leukocytes,UA: NEGATIVE
Nitrite, UA: NEGATIVE
Protein,UA: NEGATIVE
Specific Gravity, UA: 1.02 (ref 1.005–1.030)
Urobilinogen, Ur: 0.2 mg/dL (ref 0.2–1.0)
pH, UA: 7 (ref 5.0–7.5)

## 2019-06-14 NOTE — Assessment & Plan Note (Signed)
Got very flustered with her 6CIT today. Will recheck in 6 months. Call with any concerns.

## 2019-06-14 NOTE — Assessment & Plan Note (Signed)
Labs drawn today. Await results. Treat as needed. Call with any concerns.   

## 2019-06-14 NOTE — Patient Instructions (Addendum)
Preventative Services:  Health Risk Assessment and Personalized Prevention Plan: Done today Bone Mass Measurements: Up to date Breast Cancer Screening: N/A CVD Screening: Done today Cervical Cancer Screening: N/A Colon Cancer Screening: N/A Depression Screening: Done today Diabetes Screening: Done today Glaucoma Screening: See your eye doctor Hepatitis B vaccine: N/A Hepatitis C screening: N/A HIV Screening: N/A Flu Vaccine: Up to date Lung cancer Screening: N/A Obesity Screening: Done today Pneumonia Vaccines (2): up to date STI Screening: N/A   Health Maintenance After Age 64 After age 46, you are at a higher risk for certain long-term diseases and infections as well as injuries from falls. Falls are a major cause of broken bones and head injuries in people who are older than age 66. Getting regular preventive care can help to keep you healthy and well. Preventive care includes getting regular testing and making lifestyle changes as recommended by your health care provider. Talk with your health care provider about:  Which screenings and tests you should have. A screening is a test that checks for a disease when you have no symptoms.  A diet and exercise plan that is right for you. What should I know about screenings and tests to prevent falls? Screening and testing are the best ways to find a health problem early. Early diagnosis and treatment give you the best chance of managing medical conditions that are common after age 21. Certain conditions and lifestyle choices may make you more likely to have a fall. Your health care provider may recommend:  Regular vision checks. Poor vision and conditions such as cataracts can make you more likely to have a fall. If you wear glasses, make sure to get your prescription updated if your vision changes.  Medicine review. Work with your health care provider to regularly review all of the medicines you are taking, including over-the-counter  medicines. Ask your health care provider about any side effects that may make you more likely to have a fall. Tell your health care provider if any medicines that you take make you feel dizzy or sleepy.  Osteoporosis screening. Osteoporosis is a condition that causes the bones to get weaker. This can make the bones weak and cause them to break more easily.  Blood pressure screening. Blood pressure changes and medicines to control blood pressure can make you feel dizzy.  Strength and balance checks. Your health care provider may recommend certain tests to check your strength and balance while standing, walking, or changing positions.  Foot health exam. Foot pain and numbness, as well as not wearing proper footwear, can make you more likely to have a fall.  Depression screening. You may be more likely to have a fall if you have a fear of falling, feel emotionally low, or feel unable to do activities that you used to do.  Alcohol use screening. Using too much alcohol can affect your balance and may make you more likely to have a fall. What actions can I take to lower my risk of falls? General instructions  Talk with your health care provider about your risks for falling. Tell your health care provider if: ? You fall. Be sure to tell your health care provider about all falls, even ones that seem minor. ? You feel dizzy, sleepy, or off-balance.  Take over-the-counter and prescription medicines only as told by your health care provider. These include any supplements.  Eat a healthy diet and maintain a healthy weight. A healthy diet includes low-fat dairy products, low-fat (lean) meats, and  fiber from whole grains, beans, and lots of fruits and vegetables. Home safety  Remove any tripping hazards, such as rugs, cords, and clutter.  Install safety equipment such as grab bars in bathrooms and safety rails on stairs.  Keep rooms and walkways well-lit. Activity   Follow a regular exercise  program to stay fit. This will help you maintain your balance. Ask your health care provider what types of exercise are appropriate for you.  If you need a cane or walker, use it as recommended by your health care provider.  Wear supportive shoes that have nonskid soles. Lifestyle  Do not drink alcohol if your health care provider tells you not to drink.  If you drink alcohol, limit how much you have: ? 0-1 drink a day for women. ? 0-2 drinks a day for men.  Be aware of how much alcohol is in your drink. In the [place] one drink equals one typical bottle of beer (12 oz), one-half glass of wine (5 oz), or one shot of hard liquor (1 oz).  Do not use any products that contain nicotine or tobacco, such as cigarettes and e-cigarettes. If you need help quitting, ask your health care provider. Summary  Having a healthy lifestyle and getting preventive care can help to protect your health and wellness after age 37.  Screening and testing are the best way to find a health problem early and help you avoid having a fall. Early diagnosis and treatment give you the best chance for managing medical conditions that are more common for people who are older than age 31.  Falls are a major cause of broken bones and head injuries in people who are older than age 92. Take precautions to prevent a fall at home.  Work with your health care provider to learn what changes you can make to improve your health and wellness and to prevent falls. This information is not intended to replace advice given to you by your health care provider. Make sure you discuss any questions you have with your health care provider. Document Released: 04/27/2017 Document Revised: 10/05/2018 Document Reviewed: 04/27/2017 Elsevier Patient Education  2020 [employer].

## 2019-06-14 NOTE — Assessment & Plan Note (Signed)
Due for DEXA again in 2 years. Checking vitamin D today. Call with any concerns.

## 2019-06-14 NOTE — Progress Notes (Signed)
BP 137/75 (BP Location: Left Arm, Patient Position: Sitting, Cuff Size: Normal)   Pulse 72   Temp 99.5 F (37.5 C) (Oral)   Ht 5' 5"  (1.651 m)   Wt 113 lb 12.8 oz (51.6 kg)   SpO2 99%   BMI 18.94 kg/m    Subjective:    Patient ID: Amanda Bruce, female    DOB: July 27, 1940, 78 y.o.   MRN: [id number]  HPI: Amanda Bruce is a 78 y.o. female presenting on 06/14/2019 for comprehensive medical examination. Current medical complaints include: Had COVID about 7-8 weeks ago, but better now. Feeling back to normal.   She currently lives with: alone Menopausal Symptoms: no  Functional Status Survey: Is the patient deaf or have difficulty hearing?: No Does the patient have difficulty seeing, even when wearing glasses/contacts?: No Does the patient have difficulty concentrating, remembering, or making decisions?: No Does the patient have difficulty walking or climbing stairs?: No Does the patient have difficulty dressing or bathing?: No Does the patient have difficulty doing errands alone such as visiting a doctor's office or shopping?: No  Fall Risk  06/14/2019 06/12/2018 06/14/2017 12/07/2016 06/08/2016  Falls in the past year? 0 0 No No No  Number falls in past yr: 0 - - - -  Injury with Fall? 0 - - - -    Depression Screen Depression screen [facility] 2/9 06/14/2019 06/12/2018 06/14/2017 06/08/2016 06/05/2015  Decreased Interest 0 0 0 0 0  Down, Depressed, Hopeless 0 0 0 0 0  PHQ - 2 Score 0 0 0 0 0  Altered sleeping 0 0 - - -  Tired, decreased energy 0 0 - - -  Change in appetite 0 0 - - -  Feeling bad or failure about yourself  0 0 - - -  Trouble concentrating 0 0 - - -  Moving slowly or fidgety/restless 0 0 - - -  Suicidal thoughts 0 0 - - -  PHQ-9 Score 0 0 - - -  Difficult doing work/chores Not difficult at all - - - -   Advanced Directives Does patient have a HCPOA?    yes Does patient have a living will or MOST form?  No  Past Medical History:  Past Medical  History:  Diagnosis Date  . History of kidney stones    30+ years ago  . Osteoporosis     Surgical History:  History reviewed. No pertinent surgical history.  Medications:  Current Outpatient Medications on File Prior to Visit  Medication Sig  . aspirin EC 81 MG tablet Take 81 mg by mouth daily.  Marland Kitchen BIOTIN PO Take 1 tablet by mouth daily.  Marland Kitchen tretinoin (RETIN-A) 0.05 % cream Apply topically at bedtime.  Marland Kitchen VITAMIN A PO Take 1 tablet by mouth daily.  Marland Kitchen VITAMIN E PO Take 1 tablet by mouth daily.   No current facility-administered medications on file prior to visit.    Allergies:  No Known Allergies  Social History:  Social History   Socioeconomic History  . Marital status: Widowed    Spouse name: Not on file  . Number of children: Not on file  . Years of education: Not on file  . Highest education level: Not on file  Occupational History  . Not on file  Tobacco Use  . Smoking status: Never Smoker  . Smokeless tobacco: Never Used  Substance and Sexual Activity  . Alcohol use: No  . Drug use: No  . Sexual activity: Not on file  Other Topics Concern  . Not on file  Social History Narrative  . Not on file   Social Determinants of Health   Financial Resource Strain:   . Difficulty of Paying Living Expenses: Not on file  Food Insecurity:   . Worried About Charity fundraiser in the Last Year: Not on file  . Ran Out of Food in the Last Year: Not on file  Transportation Needs:   . Lack of Transportation (Medical): Not on file  . Lack of Transportation (Non-Medical): Not on file  Physical Activity:   . Days of Exercise per Week: Not on file  . Minutes of Exercise per Session: Not on file  Stress:   . Feeling of Stress : Not on file  Social Connections:   . Frequency of Communication with Friends and Family: Not on file  . Frequency of Social Gatherings with Friends and Family: Not on file  . Attends Religious Services: Not on file  . Active Member of Clubs or  Organizations: Not on file  . Attends Archivist Meetings: Not on file  . Marital Status: Not on file  Intimate Partner Violence:   . Fear of Current or Ex-Partner: Not on file  . Emotionally Abused: Not on file  . Physically Abused: Not on file  . Sexually Abused: Not on file   Social History   Tobacco Use  Smoking Status Never Smoker  Smokeless Tobacco Never Used   Social History   Substance and Sexual Activity  Alcohol Use No    Family History:  Family History  Problem Relation Age of Onset  . Hypertension Mother   . Cancer Father   . Hypertension Sister   . Cancer Brother   . Breast cancer Other     Past medical history, surgical history, medications, allergies, family history and social history reviewed with patient today and changes made to appropriate areas of the chart.   Review of Systems  Constitutional: Negative.   HENT: Negative.   Eyes: Negative.   Respiratory: Negative.   Cardiovascular: Negative.   Gastrointestinal: Negative.   Genitourinary: Negative.   Musculoskeletal: Positive for myalgias. Negative for back pain, falls, joint pain and neck pain.  Skin: Negative.   Neurological: Negative.   Endo/Heme/Allergies: Negative.   Psychiatric/Behavioral: Negative.     All other ROS negative except what is listed above and in the HPI.      Objective:    BP 137/75 (BP Location: Left Arm, Patient Position: Sitting, Cuff Size: Normal)   Pulse 72   Temp 99.5 F (37.5 C) (Oral)   Ht 5' 5"  (1.651 m)   Wt 113 lb 12.8 oz (51.6 kg)   SpO2 99%   BMI 18.94 kg/m   Wt Readings from Last 3 Encounters:  06/14/19 113 lb 12.8 oz (51.6 kg)  05/10/19 116 lb (52.6 kg)  06/12/18 114 lb (51.7 kg)    Physical Exam Vitals and nursing note reviewed.  Constitutional:      General: She is not in acute distress.    Appearance: Normal appearance. She is not ill-appearing, toxic-appearing or diaphoretic.  HENT:     Head: Normocephalic and atraumatic.      Right Ear: Tympanic membrane, ear canal and external ear normal. There is no impacted cerumen.     Left Ear: Tympanic membrane, ear canal and external ear normal. There is no impacted cerumen.     Nose: Nose normal. No congestion or rhinorrhea.     Mouth/Throat:  Mouth: Mucous membranes are moist.     Pharynx: Oropharynx is clear. No oropharyngeal exudate or posterior oropharyngeal erythema.  Eyes:     General: No scleral icterus.       Right eye: No discharge.        Left eye: No discharge.     Extraocular Movements: Extraocular movements intact.     Conjunctiva/sclera: Conjunctivae normal.     Pupils: Pupils are equal, round, and reactive to light.  Neck:     Vascular: No carotid bruit.  Cardiovascular:     Rate and Rhythm: Normal rate and regular rhythm.     Pulses: Normal pulses.     Heart sounds: No murmur. No friction rub. No gallop.   Pulmonary:     Effort: Pulmonary effort is normal. No respiratory distress.     Breath sounds: Normal breath sounds. No stridor. No wheezing, rhonchi or rales.  Chest:     Chest wall: No tenderness.  Abdominal:     General: Abdomen is flat. Bowel sounds are normal. There is no distension.     Palpations: Abdomen is soft. There is no mass.     Tenderness: There is no abdominal tenderness. There is no right CVA tenderness, left CVA tenderness, guarding or rebound.     Hernia: No hernia is present.  Genitourinary:    Comments: Breast and pelvic exams deferred with shared decision making Musculoskeletal:        General: No swelling, tenderness, deformity or signs of injury.     Cervical back: Normal range of motion and neck supple. No rigidity. No muscular tenderness.     Right lower leg: No edema.     Left lower leg: No edema.  Lymphadenopathy:     Cervical: No cervical adenopathy.  Skin:    General: Skin is warm and dry.     Capillary Refill: Capillary refill takes less than 2 seconds.     Coloration: Skin is not jaundiced or pale.      Findings: No bruising, erythema, lesion or rash.  Neurological:     General: No focal deficit present.     Mental Status: She is alert and oriented to person, place, and time. Mental status is at baseline.     Cranial Nerves: No cranial nerve deficit.     Sensory: No sensory deficit.     Motor: No weakness.     Coordination: Coordination normal.     Gait: Gait normal.     Deep Tendon Reflexes: Reflexes normal.  Psychiatric:        Mood and Affect: Mood normal.        Behavior: Behavior normal.        Thought Content: Thought content normal.        Judgment: Judgment normal.     6CIT Screen 06/14/2019  What Year? 0 points  What month? 3 points  What time? 0 points  Count back from 20 0 points  Months in reverse 4 points  Repeat phrase 4 points  Total Score 11     Results for orders placed or performed in visit on 06/14/19  Microscopic Examination   URINE  Result Value Ref Range   WBC, UA 0-5 0 - 5 /hpf   RBC 0-2 0 - 2 /hpf   Epithelial Cells (non renal) 0-10 0 - 10 /hpf   Bacteria, UA Few (A) None seen/Few  UA/M w/rflx Culture, Routine   Specimen: Urine   URINE  Result Value Ref Range  Specific Gravity, UA 1.020 1.005 - 1.030   pH, UA 7.0 5.0 - 7.5   Color, UA Yellow Yellow   Appearance Ur Clear Clear   Leukocytes,UA Negative Negative   Protein,UA Negative Negative/Trace   Glucose, UA Negative Negative   Ketones, UA Negative Negative   RBC, UA Trace (A) Negative   Bilirubin, UA Negative Negative   Urobilinogen, Ur 0.2 0.2 - 1.0 mg/dL   Nitrite, UA Negative Negative   Microscopic Examination See below:       Assessment & Plan:   Problem List Items Addressed This Visit      Musculoskeletal and Integument   Osteoporosis    Due for DEXA again in 2 years. Checking vitamin D today. Call with any concerns.       Relevant Orders   TSH   Vit D  25 hydroxy (rtn osteoporosis monitoring)     Other   Hypercholesteremia    Labs drawn today. Await results.  Treat as needed. Call with any concerns.        Relevant Orders   Comp Met (CMET)   Lipid Panel w/o Chol/HDL Ratio OUT   Memory deficit    Got very flustered with her 6CIT today. Will recheck in 6 months. Call with any concerns.        Other Visit Diagnoses    Encounter for annual wellness exam in Medicare patient    -  Primary   Preventative care discussed today as below. Call with any concerns.    History of UTI       Rechecking labs today. Await results.    Relevant Orders   CBC with Differential OUT   UA/M w/rflx Culture, Routine (Completed)       Preventative Services:  Health Risk Assessment and Personalized Prevention Plan: Done today Bone Mass Measurements: Up to date Breast Cancer Screening: N/A CVD Screening: Done today Cervical Cancer Screening: N/A Colon Cancer Screening: N/A Depression Screening: Done today Diabetes Screening: Done today Glaucoma Screening: See your eye doctor Hepatitis B vaccine: N/A Hepatitis C screening: N/A HIV Screening: N/A Flu Vaccine: Up to date Lung cancer Screening: N/A Obesity Screening: Done today Pneumonia Vaccines (2): up to date STI Screening: N/A  Follow up plan: Return in about 6 months (around 12/13/2019).   LABORATORY TESTING:  - Pap smear: not applicable  IMMUNIZATIONS:   - Tdap: Tetanus vaccination status reviewed: last tetanus booster within 10 years. - Influenza: Up to date - Pneumovax: Up to date - Prevnar: Up to date - Zostavax vaccine: Given elsewhere  SCREENING: -Mammogram: Not applicable  - Colonoscopy: Not applicable  - Bone Density: Up to date   PATIENT COUNSELING:   Advised to take 1 mg of folate supplement per day if capable of pregnancy.   Sexuality: Discussed sexually transmitted diseases, partner selection, use of condoms, avoidance of unintended pregnancy  and contraceptive alternatives.   Advised to avoid cigarette smoking.  I discussed with the patient that most people either abstain  from alcohol or drink within safe limits (<=14/week and <=4 drinks/occasion for males, <=7/weeks and <= 3 drinks/occasion for females) and that the risk for alcohol disorders and other health effects rises proportionally with the number of drinks per week and how often a drinker exceeds daily limits.  Discussed cessation/primary prevention of drug use and availability of treatment for abuse.   Diet: Encouraged to adjust caloric intake to maintain  or achieve ideal body weight, to reduce intake of dietary saturated fat and total fat,  to limit sodium intake by avoiding high sodium foods and not adding table salt, and to maintain adequate dietary potassium and calcium preferably from fresh fruits, vegetables, and low-fat dairy products.    stressed the importance of regular exercise  Injury prevention: Discussed safety belts, safety helmets, smoke detector, smoking near bedding or upholstery.   Dental health: Discussed importance of regular tooth brushing, flossing, and dental visits.    NEXT PREVENTATIVE PHYSICAL DUE IN 1 YEAR. Return in about 6 months (around 12/13/2019).

## 2019-06-15 LAB — CBC WITH DIFFERENTIAL/PLATELET
Basophils Absolute: 0 10*3/uL (ref 0.0–0.2)
Basos: 1 %
EOS (ABSOLUTE): 0.1 10*3/uL (ref 0.0–0.4)
Eos: 3 %
Hematocrit: 40.2 % (ref 34.0–46.6)
Hemoglobin: 13.4 g/dL (ref 11.1–15.9)
Immature Grans (Abs): 0 10*3/uL (ref 0.0–0.1)
Immature Granulocytes: 0 %
Lymphocytes Absolute: 1.3 10*3/uL (ref 0.7–3.1)
Lymphs: 29 %
MCH: 32 pg (ref 26.6–33.0)
MCHC: 33.3 g/dL (ref 31.5–35.7)
MCV: 96 fL (ref 79–97)
Monocytes Absolute: 0.4 10*3/uL (ref 0.1–0.9)
Monocytes: 8 %
Neutrophils Absolute: 2.6 10*3/uL (ref 1.4–7.0)
Neutrophils: 59 %
Platelets: 326 10*3/uL (ref 150–450)
RBC: 4.19 x10E6/uL (ref 3.77–5.28)
RDW: 12.9 % (ref 11.7–15.4)
WBC: 4.4 10*3/uL (ref 3.4–10.8)

## 2019-06-15 LAB — TSH: TSH: 1.98 u[IU]/mL (ref 0.450–4.500)

## 2019-06-15 LAB — COMPREHENSIVE METABOLIC PANEL
ALT: 9 IU/L (ref 0–32)
AST: 18 IU/L (ref 0–40)
Albumin/Globulin Ratio: 1.6 (ref 1.2–2.2)
Albumin: 4.5 g/dL (ref 3.7–4.7)
Alkaline Phosphatase: 104 IU/L (ref 39–117)
BUN/Creatinine Ratio: 32 — ABNORMAL HIGH (ref 12–28)
BUN: 18 mg/dL (ref 8–27)
Bilirubin Total: 0.6 mg/dL (ref 0.0–1.2)
CO2: 26 mmol/L (ref 20–29)
Calcium: 9.5 mg/dL (ref 8.7–10.3)
Chloride: 102 mmol/L (ref 96–106)
Creatinine, Ser: 0.56 mg/dL — ABNORMAL LOW (ref 0.57–1.00)
GFR calc Af Amer: 104 mL/min/{1.73_m2} (ref >59)
GFR calc non Af Amer: 90 mL/min/{1.73_m2} (ref >59)
Globulin, Total: 2.8 g/dL (ref 1.5–4.5)
Glucose: 82 mg/dL (ref 65–99)
Potassium: 4.4 mmol/L (ref 3.5–5.2)
Sodium: 142 mmol/L (ref 134–144)
Total Protein: 7.3 g/dL (ref 6.0–8.5)

## 2019-06-15 LAB — VITAMIN D 25 HYDROXY (VIT D DEFICIENCY, FRACTURES): Vit D, 25-Hydroxy: 41.5 ng/mL (ref 30.0–100.0)

## 2019-06-15 LAB — LIPID PANEL W/O CHOL/HDL RATIO
Cholesterol, Total: 204 mg/dL — ABNORMAL HIGH (ref 100–199)
HDL: 61 mg/dL (ref >39)
LDL Chol Calc (NIH): 131 mg/dL — ABNORMAL HIGH (ref 0–99)
Triglycerides: 65 mg/dL (ref 0–149)
VLDL Cholesterol Cal: 12 mg/dL (ref 5–40)

## 2019-06-17 ENCOUNTER — Encounter: Payer: Self-pay | Admitting: Family Medicine

## 2019-06-26 ENCOUNTER — Telehealth: Payer: Self-pay

## 2019-06-26 DIAGNOSIS — Z1231 Encounter for screening mammogram for malignant neoplasm of breast: Secondary | ICD-10-CM

## 2019-06-26 NOTE — Telephone Encounter (Signed)
Copied from [place] [id number]. Topic: General - Inquiry >> Jun 26, 2019  9:02 AM Scherrie Gerlach wrote: Reason for CRM: pt wants Dr Wynetta Emery to know she has changed her mind and she would like to get her mammogram. Pt needs order to get at [facility].  Please call pt when order is in

## 2019-08-02 DIAGNOSIS — L814 Other melanin hyperpigmentation: Secondary | ICD-10-CM | POA: Diagnosis not present

## 2019-08-02 DIAGNOSIS — S60011A Contusion of right thumb without damage to nail, initial encounter: Secondary | ICD-10-CM | POA: Diagnosis not present

## 2019-08-06 ENCOUNTER — Other Ambulatory Visit: Payer: Self-pay

## 2019-08-06 ENCOUNTER — Encounter (INDEPENDENT_AMBULATORY_CARE_PROVIDER_SITE_OTHER): Payer: Self-pay

## 2019-08-06 ENCOUNTER — Encounter: Payer: Self-pay | Admitting: Family Medicine

## 2019-08-06 ENCOUNTER — Ambulatory Visit
Admission: RE | Admit: 2019-08-06 | Discharge: 2019-08-06 | Disposition: A | Discharge status: Home / Self Care | Payer: Medicare Other | Source: Ambulatory Visit | Attending: Family Medicine | Admitting: Family Medicine

## 2019-08-06 DIAGNOSIS — Z1231 Encounter for screening mammogram for malignant neoplasm of breast: Secondary | ICD-10-CM | POA: Diagnosis not present

## 2019-11-06 ENCOUNTER — Encounter: Payer: Self-pay | Admitting: Family Medicine

## 2019-12-13 ENCOUNTER — Ambulatory Visit: Payer: Medicare Other | Admitting: Family Medicine

## 2020-03-12 DIAGNOSIS — Z23 Encounter for immunization: Secondary | ICD-10-CM | POA: Diagnosis not present

## 2020-04-10 DIAGNOSIS — Z20822 Contact with and (suspected) exposure to covid-19: Secondary | ICD-10-CM | POA: Diagnosis not present

## 2020-07-03 ENCOUNTER — Encounter: Payer: Self-pay | Admitting: Nurse Practitioner

## 2020-07-03 ENCOUNTER — Ambulatory Visit (INDEPENDENT_AMBULATORY_CARE_PROVIDER_SITE_OTHER): Payer: Medicare Other | Admitting: Nurse Practitioner

## 2020-07-03 DIAGNOSIS — R059 Cough, unspecified: Secondary | ICD-10-CM | POA: Diagnosis not present

## 2020-07-03 NOTE — Progress Notes (Signed)
There were no vitals taken for this visit.   Subjective:    Patient ID: Amanda Bruce, female    DOB: 12/20/1940, 80 y.o.   MRN: [id number]  HPI: Amanda Bruce is a 80 y.o. female  Chief Complaint  Patient presents with  . Nasal Congestion    Has had for 4 to 5 days, no none exposure to covid that she is aware of, but has been to 2 funerals last week where some did wear a mask and others did not  . Cough  . Sore Throat    . This visit was completed via telephone due to the restrictions of the COVID-19 pandemic. All issues as above were discussed and addressed but no physical exam was performed. If it was felt that the patient should be evaluated in the office, they were directed there. The patient verbally consented to this visit. Patient was unable to complete an audio/visual visit due to Lack of equipment. Due to the catastrophic nature of the COVID-19 pandemic, this visit was done through audio contact only. . Location of the patient: parking lot . Location of the provider: work . Those involved with this call:  . Provider: Marnee Guarneri, DNP . CMA: Yvonna Alanis, CMA . Front Desk/Registration: Jill Side  . Time spent on call: 21 minutes on the phone discussing health concerns. 14 minutes total spent in review of patient's record and preparation of their chart.  . I verified patient identity using two factors (patient name and date of birth). Patient consents verbally to being seen via telemedicine visit today.    UPPER RESPIRATORY TRACT INFECTION Reports she has had symptoms for 4-5 days.  She did attended a funeral one week ago where some wore masks and some did not.  She is Covid vaccinated with booster and had flu vaccine.   No loss of taste or smell. Fever: no Cough: yes Shortness of breath: no Wheezing: no Chest pain: no Chest tightness: no Chest congestion: no Nasal congestion: yes Runny nose: yes Post nasal drip: yes Sneezing: yes Sore throat:  yes Swollen glands: no Sinus pressure: no Headache: no Face pain: no Toothache: no Ear pain: none Ear pressure: none Eyes red/itching:no Eye drainage/crusting: no  Vomiting: no Rash: no Fatigue: no Sick contacts: unknown Strep contacts: no  Context: stable Recurrent sinusitis: no Relief with OTC cold/cough medications: yes  Treatments attempted: cold/sinus and cough syrup   Relevant past medical, surgical, family and social history reviewed and updated as indicated. Interim medical history since our last visit reviewed. Allergies and medications reviewed and updated.  Review of Systems  Constitutional: Negative for activity change, appetite change, chills, fatigue and fever.  HENT: Positive for congestion, postnasal drip and rhinorrhea. Negative for ear discharge, ear pain, facial swelling, sinus pressure, sinus pain, sneezing, sore throat and voice change.   Eyes: Negative for pain and visual disturbance.  Respiratory: Positive for cough. Negative for chest tightness, shortness of breath and wheezing.   Cardiovascular: Negative for chest pain, palpitations and leg swelling.  Gastrointestinal: Negative.   Musculoskeletal: Negative for myalgias.  Neurological: Negative.   Psychiatric/Behavioral: Negative.     Per HPI unless specifically indicated above     Objective:    There were no vitals taken for this visit.  Wt Readings from Last 3 Encounters:  06/14/19 113 lb 12.8 oz (51.6 kg)  05/10/19 116 lb (52.6 kg)  06/12/18 114 lb (51.7 kg)    Physical Exam   Unable to perform due  to telephone visit only.  Results for orders placed or performed in visit on 06/14/19  Microscopic Examination   URINE  Result Value Ref Range   WBC, UA 0-5 0 - 5 /hpf   RBC 0-2 0 - 2 /hpf   Epithelial Cells (non renal) 0-10 0 - 10 /hpf   Bacteria, UA Few (A) None seen/Few  CBC with Differential OUT  Result Value Ref Range   WBC 4.4 3.4 - 10.8 x10E3/uL   RBC 4.19 3.77 - 5.28 x10E6/uL    Hemoglobin 13.4 11.1 - 15.9 g/dL   Hematocrit 40.2 34.0 - 46.6 %   MCV 96 79 - 97 fL   MCH 32.0 26.6 - 33.0 pg   MCHC 33.3 31.5 - 35.7 g/dL   RDW 12.9 11.7 - 15.4 %   Platelets 326 150 - 450 x10E3/uL   Neutrophils 59 Not Estab. %   Lymphs 29 Not Estab. %   Monocytes 8 Not Estab. %   Eos 3 Not Estab. %   Basos 1 Not Estab. %   Neutrophils Absolute 2.6 1.4 - 7.0 x10E3/uL   Lymphocytes Absolute 1.3 0.7 - 3.1 x10E3/uL   Monocytes Absolute 0.4 0.1 - 0.9 x10E3/uL   EOS (ABSOLUTE) 0.1 0.0 - 0.4 x10E3/uL   Basophils Absolute 0.0 0.0 - 0.2 x10E3/uL   Immature Granulocytes 0 Not Estab. %   Immature Grans (Abs) 0.0 0.0 - 0.1 x10E3/uL  Comp Met (CMET)  Result Value Ref Range   Glucose 82 65 - 99 mg/dL   BUN 18 8 - 27 mg/dL   Creatinine, Ser 0.56 (L) 0.57 - 1.00 mg/dL   GFR calc non Af Amer 90 >59 mL/min/1.73   GFR calc Af Amer 104 >59 mL/min/1.73   BUN/Creatinine Ratio 32 (H) 12 - 28   Sodium 142 134 - 144 mmol/L   Potassium 4.4 3.5 - 5.2 mmol/L   Chloride 102 96 - 106 mmol/L   CO2 26 20 - 29 mmol/L   Calcium 9.5 8.7 - 10.3 mg/dL   Total Protein 7.3 6.0 - 8.5 g/dL   Albumin 4.5 3.7 - 4.7 g/dL   Globulin, Total 2.8 1.5 - 4.5 g/dL   Albumin/Globulin Ratio 1.6 1.2 - 2.2   Bilirubin Total 0.6 0.0 - 1.2 mg/dL   Alkaline Phosphatase 104 39 - 117 IU/L   AST 18 0 - 40 IU/L   ALT 9 0 - 32 IU/L  Lipid Panel w/o Chol/HDL Ratio OUT  Result Value Ref Range   Cholesterol, Total 204 (H) 100 - 199 mg/dL   Triglycerides 65 0 - 149 mg/dL   HDL 61 >39 mg/dL   VLDL Cholesterol Cal 12 5 - 40 mg/dL   LDL Chol Calc (NIH) 131 (H) 0 - 99 mg/dL  TSH  Result Value Ref Range   TSH 1.980 0.450 - 4.500 uIU/mL  UA/M w/rflx Culture, Routine   Specimen: Urine   URINE  Result Value Ref Range   Specific Gravity, UA 1.020 1.005 - 1.030   pH, UA 7.0 5.0 - 7.5   Color, UA Yellow Yellow   Appearance Ur Clear Clear   Leukocytes,UA Negative Negative   Protein,UA Negative Negative/Trace   Glucose, UA  Negative Negative   Ketones, UA Negative Negative   RBC, UA Trace (A) Negative   Bilirubin, UA Negative Negative   Urobilinogen, Ur 0.2 0.2 - 1.0 mg/dL   Nitrite, UA Negative Negative   Microscopic Examination See below:   Vit D  25 hydroxy (rtn osteoporosis monitoring)  Result Value Ref Range   Vit D, 25-Hydroxy 41.5 30.0 - 100.0 ng/mL      Assessment & Plan:   Problem List Items Addressed This Visit      Other   Cough - Primary    Acute x 4-5 days, stable.  Covid vaccinated x 3.  Due to current pandemic and increased cases in community + possible exposure one week ago at two funerals will obtain Covid testing today.  Recommend she self quarantine at this time until results return and symptoms have improved.  Recommend: - Increased rest - Increasing Fluids - Acetaminophen as needed for fever/pain.  - Salt water gargling, chloraseptic spray and throat lozenges - Mucinex.   - Humidifying the air Return to office for worsening or ongoing symptoms, if ongoing consider abx therapy.      Relevant Orders   Novel Coronavirus, NAA ([facility])      I discussed the assessment and treatment plan with the patient. The patient was provided an opportunity to ask questions and all were answered. The patient agreed with the plan and demonstrated an understanding of the instructions.   The patient was advised to call back or seek an in-person evaluation if the symptoms worsen or if the condition fails to improve as anticipated.   I provided 21+ minutes of time during this encounter.  Follow up plan: Return if symptoms worsen or fail to improve.

## 2020-07-03 NOTE — Patient Instructions (Signed)

## 2020-07-03 NOTE — Assessment & Plan Note (Signed)
Acute x 4-5 days, stable.  Covid vaccinated x 3.  Due to current pandemic and increased cases in community + possible exposure one week ago at two funerals will obtain Covid testing today.  Recommend she self quarantine at this time until results return and symptoms have improved.  Recommend: - Increased rest - Increasing Fluids - Acetaminophen as needed for fever/pain.  - Salt water gargling, chloraseptic spray and throat lozenges - Mucinex.   - Humidifying the air Return to office for worsening or ongoing symptoms, if ongoing consider abx therapy.

## 2020-07-03 NOTE — Addendum Note (Signed)
Addended by: Pablo Ledger on: 07/03/2020 08:55 AM   Modules accepted: Orders

## 2020-07-05 LAB — NOVEL CORONAVIRUS, NAA: SARS-CoV-2, NAA: NOT DETECTED

## 2020-07-05 LAB — SARS-COV-2, NAA 2 DAY TAT

## 2020-07-05 NOTE — Progress Notes (Signed)
Good morning crew, please let Amanda Bruce know her Covid testing did return negative.  I do recommend she continue to self quarantine until symptoms have improved.  If ongoing symptoms or worsening please return to office immediately.  Have a great day!!

## 2020-07-07 ENCOUNTER — Inpatient Hospital Stay
Admission: EM | Admit: 2020-07-07 | Discharge: 2020-07-15 | DRG: 853 | Disposition: A | Discharge status: Home Health | Payer: Medicare Other | Attending: Internal Medicine | Admitting: Internal Medicine

## 2020-07-07 ENCOUNTER — Emergency Department: Payer: Medicare Other

## 2020-07-07 ENCOUNTER — Encounter: Payer: Self-pay | Admitting: *Deleted

## 2020-07-07 ENCOUNTER — Other Ambulatory Visit: Payer: Self-pay

## 2020-07-07 DIAGNOSIS — R6520 Severe sepsis without septic shock: Secondary | ICD-10-CM | POA: Diagnosis present

## 2020-07-07 DIAGNOSIS — M7121 Synovial cyst of popliteal space [Baker], right knee: Secondary | ICD-10-CM | POA: Diagnosis not present

## 2020-07-07 DIAGNOSIS — E872 Acidosis: Secondary | ICD-10-CM | POA: Diagnosis present

## 2020-07-07 DIAGNOSIS — A4150 Gram-negative sepsis, unspecified: Secondary | ICD-10-CM | POA: Diagnosis not present

## 2020-07-07 DIAGNOSIS — Z7982 Long term (current) use of aspirin: Secondary | ICD-10-CM

## 2020-07-07 DIAGNOSIS — N136 Pyonephrosis: Secondary | ICD-10-CM | POA: Diagnosis present

## 2020-07-07 DIAGNOSIS — R0902 Hypoxemia: Secondary | ICD-10-CM | POA: Diagnosis not present

## 2020-07-07 DIAGNOSIS — R531 Weakness: Secondary | ICD-10-CM | POA: Diagnosis not present

## 2020-07-07 DIAGNOSIS — M810 Age-related osteoporosis without current pathological fracture: Secondary | ICD-10-CM | POA: Diagnosis present

## 2020-07-07 DIAGNOSIS — M170 Bilateral primary osteoarthritis of knee: Secondary | ICD-10-CM | POA: Diagnosis not present

## 2020-07-07 DIAGNOSIS — N390 Urinary tract infection, site not specified: Secondary | ICD-10-CM | POA: Diagnosis not present

## 2020-07-07 DIAGNOSIS — Z79899 Other long term (current) drug therapy: Secondary | ICD-10-CM | POA: Diagnosis not present

## 2020-07-07 DIAGNOSIS — Z87442 Personal history of urinary calculi: Secondary | ICD-10-CM | POA: Diagnosis not present

## 2020-07-07 DIAGNOSIS — R6521 Severe sepsis with septic shock: Secondary | ICD-10-CM | POA: Diagnosis not present

## 2020-07-07 DIAGNOSIS — E7800 Pure hypercholesterolemia, unspecified: Secondary | ICD-10-CM | POA: Diagnosis present

## 2020-07-07 DIAGNOSIS — K5900 Constipation, unspecified: Secondary | ICD-10-CM | POA: Diagnosis present

## 2020-07-07 DIAGNOSIS — Z681 Body mass index (BMI) 19 or less, adult: Secondary | ICD-10-CM | POA: Diagnosis not present

## 2020-07-07 DIAGNOSIS — R29898 Other symptoms and signs involving the musculoskeletal system: Secondary | ICD-10-CM | POA: Diagnosis not present

## 2020-07-07 DIAGNOSIS — D259 Leiomyoma of uterus, unspecified: Secondary | ICD-10-CM | POA: Diagnosis not present

## 2020-07-07 DIAGNOSIS — I5031 Acute diastolic (congestive) heart failure: Secondary | ICD-10-CM | POA: Diagnosis not present

## 2020-07-07 DIAGNOSIS — Z20822 Contact with and (suspected) exposure to covid-19: Secondary | ICD-10-CM | POA: Diagnosis present

## 2020-07-07 DIAGNOSIS — E43 Unspecified severe protein-calorie malnutrition: Secondary | ICD-10-CM | POA: Insufficient documentation

## 2020-07-07 DIAGNOSIS — M4186 Other forms of scoliosis, lumbar region: Secondary | ICD-10-CM | POA: Diagnosis not present

## 2020-07-07 DIAGNOSIS — A419 Sepsis, unspecified organism: Secondary | ICD-10-CM | POA: Diagnosis not present

## 2020-07-07 DIAGNOSIS — N135 Crossing vessel and stricture of ureter without hydronephrosis: Secondary | ICD-10-CM | POA: Diagnosis present

## 2020-07-07 DIAGNOSIS — N201 Calculus of ureter: Secondary | ICD-10-CM | POA: Diagnosis not present

## 2020-07-07 DIAGNOSIS — N179 Acute kidney failure, unspecified: Secondary | ICD-10-CM | POA: Diagnosis present

## 2020-07-07 DIAGNOSIS — E876 Hypokalemia: Secondary | ICD-10-CM | POA: Diagnosis not present

## 2020-07-07 DIAGNOSIS — R001 Bradycardia, unspecified: Secondary | ICD-10-CM | POA: Diagnosis not present

## 2020-07-07 DIAGNOSIS — T502X5A Adverse effect of carbonic-anhydrase inhibitors, benzothiadiazides and other diuretics, initial encounter: Secondary | ICD-10-CM | POA: Diagnosis not present

## 2020-07-07 DIAGNOSIS — A4151 Sepsis due to Escherichia coli [E. coli]: Principal | ICD-10-CM | POA: Diagnosis present

## 2020-07-07 DIAGNOSIS — N139 Obstructive and reflux uropathy, unspecified: Secondary | ICD-10-CM | POA: Diagnosis not present

## 2020-07-07 DIAGNOSIS — R000 Tachycardia, unspecified: Secondary | ICD-10-CM | POA: Diagnosis not present

## 2020-07-07 DIAGNOSIS — R569 Unspecified convulsions: Secondary | ICD-10-CM | POA: Diagnosis not present

## 2020-07-07 DIAGNOSIS — I110 Hypertensive heart disease with heart failure: Secondary | ICD-10-CM | POA: Diagnosis not present

## 2020-07-07 DIAGNOSIS — R319 Hematuria, unspecified: Secondary | ICD-10-CM | POA: Diagnosis not present

## 2020-07-07 DIAGNOSIS — M79651 Pain in right thigh: Secondary | ICD-10-CM | POA: Diagnosis not present

## 2020-07-07 DIAGNOSIS — R52 Pain, unspecified: Secondary | ICD-10-CM | POA: Diagnosis not present

## 2020-07-07 DIAGNOSIS — M1611 Unilateral primary osteoarthritis, right hip: Secondary | ICD-10-CM | POA: Diagnosis not present

## 2020-07-07 DIAGNOSIS — I517 Cardiomegaly: Secondary | ICD-10-CM | POA: Diagnosis not present

## 2020-07-07 DIAGNOSIS — N200 Calculus of kidney: Secondary | ICD-10-CM | POA: Diagnosis not present

## 2020-07-07 DIAGNOSIS — N132 Hydronephrosis with renal and ureteral calculous obstruction: Secondary | ICD-10-CM | POA: Diagnosis not present

## 2020-07-07 DIAGNOSIS — I491 Atrial premature depolarization: Secondary | ICD-10-CM | POA: Diagnosis not present

## 2020-07-07 DIAGNOSIS — M549 Dorsalgia, unspecified: Secondary | ICD-10-CM | POA: Diagnosis not present

## 2020-07-07 LAB — CBC
HCT: 42.8 % (ref 36.0–46.0)
Hemoglobin: 13.6 g/dL (ref 12.0–15.0)
MCH: 31.5 pg (ref 26.0–34.0)
MCHC: 31.8 g/dL (ref 30.0–36.0)
MCV: 99.1 fL (ref 80.0–100.0)
Platelets: 200 10*3/uL (ref 150–400)
RBC: 4.32 MIL/uL (ref 3.87–5.11)
RDW: 12.2 % (ref 11.5–15.5)
WBC: 2.6 10*3/uL — ABNORMAL LOW (ref 4.0–10.5)
nRBC: 0 % (ref 0.0–0.2)

## 2020-07-07 LAB — URINALYSIS, COMPLETE (UACMP) WITH MICROSCOPIC
Bilirubin Urine: NEGATIVE
Glucose, UA: NEGATIVE mg/dL
Ketones, ur: NEGATIVE mg/dL
Nitrite: POSITIVE — AB
Protein, ur: 30 mg/dL — AB
Specific Gravity, Urine: 1.011 (ref 1.005–1.030)
pH: 5 (ref 5.0–8.0)

## 2020-07-07 LAB — COMPREHENSIVE METABOLIC PANEL
ALT: 62 U/L — ABNORMAL HIGH (ref 0–44)
AST: 159 U/L — ABNORMAL HIGH (ref 15–41)
Albumin: 4 g/dL (ref 3.5–5.0)
Alkaline Phosphatase: 134 U/L — ABNORMAL HIGH (ref 38–126)
Anion gap: 13 (ref 5–15)
BUN: 30 mg/dL — ABNORMAL HIGH (ref 8–23)
CO2: 27 mmol/L (ref 22–32)
Calcium: 9.2 mg/dL (ref 8.9–10.3)
Chloride: 105 mmol/L (ref 98–111)
Creatinine, Ser: 0.92 mg/dL (ref 0.44–1.00)
GFR, Estimated: >60 mL/min (ref >60)
Glucose, Bld: 131 mg/dL — ABNORMAL HIGH (ref 70–99)
Potassium: 3.6 mmol/L (ref 3.5–5.1)
Sodium: 145 mmol/L (ref 135–145)
Total Bilirubin: 1.2 mg/dL (ref 0.3–1.2)
Total Protein: 7.3 g/dL (ref 6.5–8.1)

## 2020-07-07 LAB — LIPASE, BLOOD: Lipase: 25 U/L (ref 11–51)

## 2020-07-07 LAB — LACTIC ACID, PLASMA
Lactic Acid, Venous: 4.3 mmol/L (ref 0.5–1.9)
Lactic Acid, Venous: 4.5 mmol/L (ref 0.5–1.9)

## 2020-07-07 MED ORDER — SODIUM CHLORIDE 0.9 % IV SOLN
1.0000 g | INTRAVENOUS | Status: DC
  Administered 2020-07-07 – 2020-07-08 (×2): 1 g via INTRAVENOUS
  Filled 2020-07-07 (×2): qty 10
  Filled 2020-07-07: qty 1

## 2020-07-07 MED ORDER — LACTATED RINGERS IV BOLUS (SEPSIS)
1000.0000 mL | Freq: Once | INTRAVENOUS | Status: AC
  Administered 2020-07-07: 1000 mL via INTRAVENOUS

## 2020-07-07 MED ORDER — LACTATED RINGERS IV BOLUS (SEPSIS)
500.0000 mL | Freq: Once | INTRAVENOUS | Status: AC
  Administered 2020-07-07: 500 mL via INTRAVENOUS

## 2020-07-07 MED ORDER — LACTATED RINGERS IV SOLN: INTRAVENOUS | Status: DC

## 2020-07-07 NOTE — ED Notes (Signed)
Pt laying in bed. Pt has family at bedside. NAD noted. Pt states I woke her up from a "cat nap". When RN woke pt up oxygen was 89% on room air. RN placed pt on 2lpm via nasal canula.

## 2020-07-07 NOTE — H&P (Signed)
[place]   PATIENT NAME: Amanda Bruce    MR#:  [id number]  DATE OF BIRTH:  07-09-1940  DATE OF ADMISSION:  07/07/2020  PRIMARY CARE PHYSICIAN: Valerie Roys, DO   REQUESTING/REFERRING PHYSICIAN: Nance Pear, MD CHIEF COMPLAINT:   Chief Complaint  Patient presents with  . Weakness  . Back Pain  . Nausea    HISTORY OF PRESENT ILLNESS:  Amanda Bruce  is a 80 y.o. Caucasian female with a known history of nephrolithiasis and osteoporosis, who presented to the emergency room with a count set of low back pain in both flanks with associated nausea without vomiting and tactile fever this afternoon without chills.  She denies any hematuria or significant urinary frequency or urgency or dysuria.  No chest pain or dyspnea or cough or wheezing.  When she came to the ER heart rate was 119 and blood pressure 92/56 with otherwise normal vital signs.  Later blood pressure was 75/59 and after hydration 120/69.  Labs revealed a BUN of 30 AST 159 ALT of 62, alk phos of 134 with otherwise unremarkable CMP.  Lactic acid was 4.3 and later 4.5.  CBC showed leukopenia of 2.6.  Respiratory panel is currently pending.  UA was positive for UTI.  Urine culture was sent.  Renal stone CT study showed severe left hydronephrosis due to 2 distal left ureteral stones both 2 mm and bilateral nephrolithiasis.  She was given 1 g of IV Rocephin and 1.5 L bolus of IV lactated Ringer.  Dr. Bernardo Heater was notified about the patient and will take her to the [facility].  She will be admitted to a progressive unit bed for further evaluation and management.  PAST MEDICAL HISTORY:   Past Medical History:  Diagnosis Date  . History of kidney stones    30+ years ago  . Osteoporosis     PAST SURGICAL HISTORY:  History reviewed. No pertinent surgical history.  She denies any previous surgeries  SOCIAL HISTORY:   Social History   Tobacco Use  . Smoking status: Never Smoker  . Smokeless tobacco: Never  Used  Substance Use Topics  . Alcohol use: No    FAMILY HISTORY:   Family History  Problem Relation Age of Onset  . Hypertension Mother   . Cancer Father   . Hypertension Sister   . Cancer Brother   . Breast cancer Other     DRUG ALLERGIES:  No Known Allergies  REVIEW OF SYSTEMS:   ROS As per history of present illness. All pertinent systems were reviewed above. Constitutional, HEENT, cardiovascular, respiratory, GI, GU, musculoskeletal, neuro, psychiatric, endocrine, integumentary and hematologic systems were reviewed and are otherwise negative/unremarkable except for positive findings mentioned above in the HPI.   MEDICATIONS AT HOME:   Prior to Admission medications   Medication Sig Start Date End Date Taking? Authorizing Provider  ascorbic acid (VITAMIN C) 500 MG tablet Take by mouth.    [provider]  aspirin EC 81 MG tablet Take 81 mg by mouth daily.    [provider]  BIOTIN PO Take 1 tablet by mouth daily.    [provider]  tretinoin (RETIN-A) 0.05 % cream Apply topically at bedtime. 08/11/18   Guadalupe Maple, MD  VITAMIN A PO Take 1 tablet by mouth daily.    [provider]  VITAMIN E PO Take 1 tablet by mouth daily.    [provider]      VITAL SIGNS:  Blood pressure 120/69, pulse 95, temperature 98.7 F (37.1 C), temperature source Oral, resp. rate 18, height 5' 7"  (1.702 m), weight 49.9 kg, SpO2 95 %.  PHYSICAL EXAMINATION:  Physical Exam  GENERAL:  80 y.o.-year-old Caucasian female patient lying in the bed with no acute distress.  EYES: Pupils equal, round, reactive to light and accommodation. No scleral icterus. Extraocular muscles intact.  HEENT: Head atraumatic, normocephalic. Oropharynx and nasopharynx clear.  NECK:  Supple, no jugular venous distention. No thyroid enlargement, no tenderness.  LUNGS: Normal breath sounds bilaterally, no wheezing, rales,rhonchi or crepitation. No use of accessory  muscles of respiration.  CARDIOVASCULAR: Regular rate and rhythm, S1, S2 normal. No murmurs, rubs, or gallops.  ABDOMEN: Soft, nondistended, nontender. Bowel sounds present. No organomegaly or mass. She had bilateral CVA tenderness more on the left than the right EXTREMITIES: No pedal edema, cyanosis, or clubbing.  NEUROLOGIC: Cranial nerves II through XII are intact. Muscle strength 5/5 in all extremities. Sensation intact. Gait not checked.  PSYCHIATRIC: The patient is alert and oriented x 3.  Normal affect and good eye contact. SKIN: No obvious rash, lesion, or ulcer.   LABORATORY PANEL:   CBC Recent Labs  Lab 07/07/20 1933  WBC 2.6*  HGB 13.6  HCT 42.8  PLT 200   ------------------------------------------------------------------------------------------------------------------  Chemistries  Recent Labs  Lab 07/07/20 1933  NA 145  K 3.6  CL 105  CO2 27  GLUCOSE 131*  BUN 30*  CREATININE 0.92  CALCIUM 9.2  AST 159*  ALT 62*  ALKPHOS 134*  BILITOT 1.2   ------------------------------------------------------------------------------------------------------------------  Cardiac Enzymes No results for input(s): TROPONINI in the last 168 hours. ------------------------------------------------------------------------------------------------------------------  RADIOLOGY:  CT Renal Stone Study  Result Date: 07/07/2020 CLINICAL DATA:  Left flank pain EXAM: CT ABDOMEN AND PELVIS WITHOUT CONTRAST TECHNIQUE: Multidetector CT imaging of the abdomen and pelvis was performed following the standard protocol without IV contrast. COMPARISON:  None. FINDINGS: Lower chest: Calcifications in the lung bases compatible with old granulomatous disease. Cardiomegaly. No effusions. Hepatobiliary: No focal hepatic abnormality. Gallbladder unremarkable. Pancreas: No focal abnormality or ductal dilatation. Spleen: No focal abnormality.  Normal size. Adrenals/Urinary Tract: Severe left  hydronephrosis and hydroureter due to 2 mm distal left ureteral stone and 2 mm left UVJ stone. Bilateral nonobstructing renal stones. Upper pole cyst on the left measures 4.7 cm. Stomach/Bowel: Moderate stool burden throughout the colon. No bowel obstruction. Vascular/Lymphatic: No evidence of aneurysm or adenopathy. Reproductive: Uterus and adnexa unremarkable.  No mass. Other: No free fluid or free air. Musculoskeletal: No acute bony abnormality. IMPRESSION: Severe left hydronephrosis due to 2 distal left ureteral stones, both 2 mm. Bilateral nephrolithiasis. Electronically Signed   By: Rolm Baptise M.D.   On: 07/07/2020 22:42      IMPRESSION AND PLAN:   1.  Left-sided obstructive uropathy secondary to left ureteral stones with subsequent marked left hydronephrosis in the setting of UTI, with subsequent sepsis as manifested by leukopenia and tachycardia, as well as severe sepsis given elevated lactic acid with history of bilateral nephrolithiasis. - The patient be admitted to a progressive unit bed. - We will continue hydration with IV normal saline. - We will continue IV antibiotic therapy with Rocephin. - We will follow blood and urine cultures. - Dr. Bernardo Heater was consulted about the patient and is aware. - The patient will likely undergo cystourethroscopy tonight, stone extraction and left ureteral stent.  2.  DVT prophylaxis. - Subcutaneous Lovenox    All the records are reviewed and  case discussed with ED provider. The plan of care was discussed in details with the patient (and family). I answered all questions. The patient agreed to proceed with the above mentioned plan. Further management will depend upon hospital course.   CODE STATUS: Full code  Status is: Inpatient  Remains inpatient appropriate because:Ongoing active pain requiring inpatient pain management, Ongoing diagnostic testing needed not appropriate for outpatient work up, Unsafe d/c plan, IV treatments appropriate  due to intensity of illness or inability to take PO and Inpatient level of care appropriate due to severity of illness   Dispo: The patient is from: Home              Anticipated d/c is to: Home              Anticipated d/c date is: 2 days              Patient currently is not medically stable to d/c.   TOTAL TIME TAKING CARE OF THIS PATIENT: 55 minutes.    Christel Mormon M.D on 07/07/2020 at 11:37 PM  [facility]   From 7 PM-7 AM, contact night-coverage [email or web address]  CC: Primary care physician; Valerie Roys, DO

## 2020-07-07 NOTE — ED Triage Notes (Addendum)
First Nurse Note:  [facility] via [facility].  C/O fever today, nausea, back pain.  VS wnl.  T:  100.4.  EKG showing frequent unifocal PVC.  20 g RAC, Zofran 4 mg, and 600 mg Ibuprofen given by EMS

## 2020-07-07 NOTE — ED Notes (Signed)
Lab reports lactic 4.3; acuity level changed

## 2020-07-07 NOTE — ED Provider Notes (Signed)
[facility] Emergency Department Provider Note  ____________________________________________   I have reviewed the triage vital signs and the nursing notes.   HISTORY  Chief Complaint Weakness, Back Pain, and Nausea   History limited by: Not Limited   HPI Amanda Bruce is a 80 y.o. female who presents to the emergency department today because of concern for back pain. The patient states that the pain started today, however son mentions she was complaining of some discomfort a couple of days ago in that same area. The patient has also had some fever and weakness today. She states that the back pain is located across her whole lower back but perhaps slightly worse on the left side. The patient does have a history of kidney stones.    Records reviewed. Per medical record review patient has a history of kidney stones.  Past Medical History:  Diagnosis Date  . History of kidney stones    30+ years ago  . Osteoporosis     Patient Active Problem List   Diagnosis Date Noted  . Cough 07/03/2020  . Memory deficit 06/14/2019  . Leg cramps, sleep related 06/12/2018  . Advanced care planning/counseling discussion 06/14/2017  . Hypercholesteremia 06/08/2016  . 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 Osteoporosis     History reviewed. No pertinent surgical history.  Prior to Admission medications   Medication Sig Start Date End Date Taking? Authorizing Provider  ascorbic acid (VITAMIN C) 500 MG tablet Take by mouth.    [provider]  aspirin EC 81 MG tablet Take 81 mg by mouth daily.    [provider]  BIOTIN PO Take 1 tablet by mouth daily.    [provider]  tretinoin (RETIN-A) 0.05 % cream Apply topically at bedtime. 08/11/18   Guadalupe Maple, MD  VITAMIN A PO Take 1 tablet by mouth daily.    [provider]  VITAMIN E PO Take 1 tablet by mouth daily.    [provider]    Allergies Patient has  no known allergies.  Family History  Problem Relation Age of Onset  . Hypertension Mother   . Cancer Father   . Hypertension Sister   . Cancer Brother   . Breast cancer Other     Social History Social History   Tobacco Use  . Smoking status: Never Smoker  . Smokeless tobacco: Never Used  Substance Use Topics  . Alcohol use: No  . Drug use: No    Review of Systems Constitutional: Positive for fever. Eyes: No visual changes. ENT: No sore throat. Cardiovascular: Denies chest pain. Respiratory: Denies shortness of breath. Gastrointestinal: Positive for one episode of nausea.  Genitourinary: Negative for dysuria. Musculoskeletal: Positive for low back pain. Skin: Negative for rash. Neurological: Negative for headaches, focal weakness or numbness.  ____________________________________________   PHYSICAL EXAM:  VITAL SIGNS: ED Triage Vitals [07/07/20 1926]  Enc Vitals Group     BP (!) 92/56     Pulse Rate (!) 119     Resp 16     Temp 98.7 F (37.1 C)     Temp Source Oral     SpO2 98 %     Weight 110 lb (49.9 kg)     Height 5\' 7"  (1.702 m)     Head Circumference      Peak Flow      Pain Score 8   Constitutional: Alert and oriented.  Eyes: Conjunctivae are normal.  ENT  Head: Normocephalic and atraumatic.      Nose: No congestion/rhinnorhea.      Mouth/Throat: Mucous membranes are moist.      Neck: No stridor. Hematological/Lymphatic/Immunilogical: No cervical lymphadenopathy. Cardiovascular: Normal rate, regular rhythm.  No murmurs, rubs, or gallops. Respiratory: Normal respiratory effort without tachypnea nor retractions. Breath sounds are clear and equal bilaterally. No wheezes/rales/rhonchi. Gastrointestinal: Soft and tender in the left lower quadrant. Positive left sided cva tenderness.  Genitourinary: Deferred Musculoskeletal: Normal range of motion in all extremities. No lower extremity edema. Neurologic:  Normal speech and language. No gross  focal neurologic deficits are appreciated.  Skin:  Skin is warm, dry and intact. No rash noted. Psychiatric: Mood and affect are normal. Speech and behavior are normal. Patient exhibits appropriate insight and judgment.  ____________________________________________    LABS (pertinent positives/negatives)  Lactic acid 4.3 Lipase 25 CBC wbc 2.6, hgb 13.6, plt 200 CMP na 145, k 3.6, glu 131, cr 0.92 UA cloudy, positive nitrite, trace leukocytes, 11-20 wbc and rbc, many bacteria ____________________________________________   EKG  I, Nance Pear, attending physician, personally viewed and interpreted this EKG  EKG Time: 1934 Rate: 102 Rhythm: sinus tachycardia Axis: normal Intervals: qtc 461 QRS: narrow, q waves v1, v2 ST changes: no st elevation Impression: abnormal ekg  ____________________________________________    RADIOLOGY  CT renal stone Two distal obstructing left ureteral stones with hydronephrosis  ____________________________________________   PROCEDURES  Procedures  CRITICAL CARE Performed by: Nance Pear   Total critical care time: 30 minutes  Critical care time was exclusive of separately billable procedures and treating other patients.  Critical care was necessary to treat or prevent imminent or life-threatening deterioration.  Critical care was time spent personally by me on the following activities: development of treatment plan with patient and/or surrogate as well as nursing, discussions with consultants, evaluation of patient's response to treatment, examination of patient, obtaining history from patient or surrogate, ordering and performing treatments and interventions, ordering and review of laboratory studies, ordering and review of radiographic studies, pulse oximetry and re-evaluation of patient's condition.  ____________________________________________   INITIAL IMPRESSION / ASSESSMENT AND PLAN / ED COURSE  Pertinent labs &  imaging results that were available during my care of the patient were reviewed by me and considered in my medical decision making (see chart for details).   Patient presented to the emergency department tonight because of concerns for low back pain and some fever and weakness.  Work-up here is concerning for urinary tract infection as well as left sided distal ureteral kidney stones.  Patient was started on IV fluids and antibiotics here.  Given kidney stones and infection urology was consulted. Will plan on contacting hospitalists for admission.  ____________________________________________   FINAL CLINICAL IMPRESSION(S) / ED DIAGNOSES  Final diagnoses:  Urinary tract infection with hematuria, site unspecified  Kidney stone     Note: This dictation was prepared with Dragon dictation. Any transcriptional errors that result from this process are unintentional     Nance Pear, MD 07/07/20 2322

## 2020-07-07 NOTE — ED Notes (Signed)
Dr. Sidney Ace notified that pt was placed on oxygen.

## 2020-07-07 NOTE — Progress Notes (Signed)
Patient notified

## 2020-07-07 NOTE — ED Triage Notes (Signed)
Pt to ED reporting sudden onset of weakness and back pain at lunch today. Nausea without vomiting with temp of 100.4. Pt reports having taken tylenol and presents to ED afebrile. Pt is alert and oriented x 4 but unable to sit up without sliding back down in wheelchair.   Pt tested negative for COVID this morning.

## 2020-07-08 ENCOUNTER — Inpatient Hospital Stay: Payer: Medicare Other

## 2020-07-08 ENCOUNTER — Encounter
Admission: EM | Disposition: A | Discharge status: Home Health | Payer: Self-pay | Source: Home / Self Care | Attending: Internal Medicine

## 2020-07-08 ENCOUNTER — Inpatient Hospital Stay: Payer: Medicare Other | Admitting: Anesthesiology

## 2020-07-08 ENCOUNTER — Encounter: Payer: Self-pay | Admitting: Family Medicine

## 2020-07-08 DIAGNOSIS — R6521 Severe sepsis with septic shock: Secondary | ICD-10-CM

## 2020-07-08 DIAGNOSIS — N132 Hydronephrosis with renal and ureteral calculous obstruction: Secondary | ICD-10-CM

## 2020-07-08 DIAGNOSIS — N390 Urinary tract infection, site not specified: Secondary | ICD-10-CM | POA: Diagnosis present

## 2020-07-08 DIAGNOSIS — I517 Cardiomegaly: Secondary | ICD-10-CM | POA: Diagnosis not present

## 2020-07-08 DIAGNOSIS — R29898 Other symptoms and signs involving the musculoskeletal system: Secondary | ICD-10-CM | POA: Diagnosis not present

## 2020-07-08 DIAGNOSIS — N135 Crossing vessel and stricture of ureter without hydronephrosis: Secondary | ICD-10-CM | POA: Diagnosis present

## 2020-07-08 DIAGNOSIS — A419 Sepsis, unspecified organism: Secondary | ICD-10-CM

## 2020-07-08 HISTORY — PX: CYSTOSCOPY WITH STENT PLACEMENT: SHX5790

## 2020-07-08 LAB — BASIC METABOLIC PANEL
Anion gap: 13 (ref 5–15)
BUN: 32 mg/dL — ABNORMAL HIGH (ref 8–23)
CO2: 24 mmol/L (ref 22–32)
Calcium: 8.1 mg/dL — ABNORMAL LOW (ref 8.9–10.3)
Chloride: 100 mmol/L (ref 98–111)
Creatinine, Ser: 0.97 mg/dL (ref 0.44–1.00)
GFR, Estimated: 59 mL/min — ABNORMAL LOW (ref >60)
Glucose, Bld: 94 mg/dL (ref 70–99)
Potassium: 3.1 mmol/L — ABNORMAL LOW (ref 3.5–5.1)
Sodium: 137 mmol/L (ref 135–145)

## 2020-07-08 LAB — RESP PANEL BY RT-PCR (FLU A&B, COVID) ARPGX2
Influenza A by PCR: NEGATIVE
Influenza B by PCR: NEGATIVE
SARS Coronavirus 2 by RT PCR: NEGATIVE

## 2020-07-08 LAB — CORTISOL-AM, BLOOD: Cortisol - AM: 46.3 ug/dL — ABNORMAL HIGH (ref 6.7–22.6)

## 2020-07-08 LAB — PROTIME-INR
INR: 1.1 (ref 0.8–1.2)
Prothrombin Time: 13.7 seconds (ref 11.4–15.2)

## 2020-07-08 LAB — CBC
HCT: 33.7 % — ABNORMAL LOW (ref 36.0–46.0)
Hemoglobin: 11 g/dL — ABNORMAL LOW (ref 12.0–15.0)
MCH: 31.4 pg (ref 26.0–34.0)
MCHC: 32.6 g/dL (ref 30.0–36.0)
MCV: 96.3 fL (ref 80.0–100.0)
Platelets: 147 10*3/uL — ABNORMAL LOW (ref 150–400)
RBC: 3.5 MIL/uL — ABNORMAL LOW (ref 3.87–5.11)
RDW: 12.5 % (ref 11.5–15.5)
WBC: 14.1 10*3/uL — ABNORMAL HIGH (ref 4.0–10.5)
nRBC: 0 % (ref 0.0–0.2)

## 2020-07-08 LAB — CK: Total CK: 342 U/L — ABNORMAL HIGH (ref 38–234)

## 2020-07-08 LAB — LACTIC ACID, PLASMA: Lactic Acid, Venous: 2.5 mmol/L (ref 0.5–1.9)

## 2020-07-08 LAB — PROCALCITONIN
Procalcitonin: >150 ng/mL
Procalcitonin: >150 ng/mL

## 2020-07-08 SURGERY — CYSTOSCOPY, WITH STENT INSERTION
Anesthesia: General | Laterality: Left

## 2020-07-08 MED ORDER — SUCCINYLCHOLINE CHLORIDE 200 MG/10ML IV SOSY
PREFILLED_SYRINGE | INTRAVENOUS | Status: AC
  Filled 2020-07-08: qty 10

## 2020-07-08 MED ORDER — FENTANYL CITRATE (PF) 100 MCG/2ML IJ SOLN: 25.0000 ug | INTRAMUSCULAR | Status: DC | PRN

## 2020-07-08 MED ORDER — SODIUM CHLORIDE 0.9 % IV SOLN: INTRAVENOUS | Status: DC

## 2020-07-08 MED ORDER — PHENYLEPHRINE HCL (PRESSORS) 10 MG/ML IV SOLN
INTRAVENOUS | Status: DC | PRN
  Administered 2020-07-08 (×3): 100 ug via INTRAVENOUS

## 2020-07-08 MED ORDER — OXYCODONE HCL 5 MG PO TABS: 5.0000 mg | ORAL_TABLET | Freq: Once | ORAL | Status: DC | PRN

## 2020-07-08 MED ORDER — LIDOCAINE HCL (CARDIAC) PF 100 MG/5ML IV SOSY
PREFILLED_SYRINGE | INTRAVENOUS | Status: DC | PRN
  Administered 2020-07-08: 60 mg via INTRAVENOUS

## 2020-07-08 MED ORDER — LACTATED RINGERS IV SOLN: INTRAVENOUS | Status: DC

## 2020-07-08 MED ORDER — LIDOCAINE HCL (PF) 2 % IJ SOLN
INTRAMUSCULAR | Status: AC
  Filled 2020-07-08: qty 5

## 2020-07-08 MED ORDER — FENTANYL CITRATE (PF) 100 MCG/2ML IJ SOLN
INTRAMUSCULAR | Status: DC | PRN
  Administered 2020-07-08: 25 ug via INTRAVENOUS
  Administered 2020-07-08: 50 ug via INTRAVENOUS

## 2020-07-08 MED ORDER — FENTANYL CITRATE (PF) 100 MCG/2ML IJ SOLN
INTRAMUSCULAR | Status: AC
  Filled 2020-07-08: qty 2

## 2020-07-08 MED ORDER — PROPOFOL 10 MG/ML IV BOLUS
INTRAVENOUS | Status: AC
  Filled 2020-07-08: qty 20

## 2020-07-08 MED ORDER — ENOXAPARIN SODIUM 40 MG/0.4ML ~~LOC~~ SOLN
40.0000 mg | SUBCUTANEOUS | Status: DC
  Administered 2020-07-08: 40 mg via SUBCUTANEOUS
  Filled 2020-07-08: qty 0.4

## 2020-07-08 MED ORDER — KETOROLAC TROMETHAMINE 15 MG/ML IJ SOLN
15.0000 mg | Freq: Four times a day (QID) | INTRAMUSCULAR | Status: DC | PRN
  Administered 2020-07-08 – 2020-07-09 (×3): 15 mg via INTRAVENOUS
  Filled 2020-07-08 (×3): qty 1

## 2020-07-08 MED ORDER — OXYCODONE HCL 5 MG/5ML PO SOLN: 5.0000 mg | Freq: Once | ORAL | Status: DC | PRN

## 2020-07-08 MED ORDER — ACETAMINOPHEN 325 MG PO TABS
650.0000 mg | ORAL_TABLET | Freq: Four times a day (QID) | ORAL | Status: DC | PRN
  Administered 2020-07-08 – 2020-07-12 (×3): 650 mg via ORAL
  Filled 2020-07-08 (×3): qty 2

## 2020-07-08 MED ORDER — SODIUM CHLORIDE 0.9 % IV BOLUS
500.0000 mL | Freq: Once | INTRAVENOUS | Status: AC
  Administered 2020-07-08: 500 mL via INTRAVENOUS

## 2020-07-08 MED ORDER — SUCCINYLCHOLINE CHLORIDE 20 MG/ML IJ SOLN
INTRAMUSCULAR | Status: DC | PRN
  Administered 2020-07-08: 100 mg via INTRAVENOUS

## 2020-07-08 MED ORDER — ONDANSETRON HCL 4 MG/2ML IJ SOLN: 4.0000 mg | Freq: Four times a day (QID) | INTRAMUSCULAR | Status: DC | PRN

## 2020-07-08 MED ORDER — IOHEXOL 180 MG/ML  SOLN
INTRAMUSCULAR | Status: DC | PRN
  Administered 2020-07-08: 15 mL

## 2020-07-08 MED ORDER — CHLORHEXIDINE GLUCONATE CLOTH 2 % EX PADS
6.0000 | MEDICATED_PAD | Freq: Every day | CUTANEOUS | Status: DC
  Administered 2020-07-09 – 2020-07-12 (×4): 6 via TOPICAL

## 2020-07-08 MED ORDER — ASCORBIC ACID 500 MG PO TABS
500.0000 mg | ORAL_TABLET | Freq: Every day | ORAL | Status: DC
  Administered 2020-07-08 – 2020-07-15 (×8): 500 mg via ORAL
  Filled 2020-07-08 (×8): qty 1

## 2020-07-08 MED ORDER — ACETAMINOPHEN 650 MG RE SUPP: 650.0000 mg | Freq: Four times a day (QID) | RECTAL | Status: DC | PRN

## 2020-07-08 MED ORDER — PROPOFOL 10 MG/ML IV BOLUS
INTRAVENOUS | Status: DC | PRN
  Administered 2020-07-08: 100 mg via INTRAVENOUS

## 2020-07-08 MED ORDER — ONDANSETRON HCL 4 MG/2ML IJ SOLN
INTRAMUSCULAR | Status: DC | PRN
  Administered 2020-07-08: 4 mg via INTRAVENOUS

## 2020-07-08 MED ORDER — SODIUM CHLORIDE 0.9 % IV SOLN: 1.0000 g | INTRAVENOUS | Status: DC

## 2020-07-08 MED ORDER — POTASSIUM CHLORIDE CRYS ER 20 MEQ PO TBCR
40.0000 meq | EXTENDED_RELEASE_TABLET | Freq: Once | ORAL | Status: AC
  Administered 2020-07-08: 40 meq via ORAL
  Filled 2020-07-08: qty 2

## 2020-07-08 MED ORDER — LACTATED RINGERS IV SOLN: INTRAVENOUS | Status: DC | PRN

## 2020-07-08 MED ORDER — MORPHINE SULFATE (PF) 2 MG/ML IV SOLN
2.0000 mg | Freq: Once | INTRAVENOUS | Status: AC
  Administered 2020-07-08: 2 mg via INTRAVENOUS
  Filled 2020-07-08: qty 1

## 2020-07-08 MED ORDER — MAGNESIUM HYDROXIDE 400 MG/5ML PO SUSP: 30.0000 mL | Freq: Every day | ORAL | Status: DC | PRN

## 2020-07-08 MED ORDER — ONDANSETRON HCL 4 MG PO TABS: 4.0000 mg | ORAL_TABLET | Freq: Four times a day (QID) | ORAL | Status: DC | PRN

## 2020-07-08 MED ORDER — TRAZODONE HCL 50 MG PO TABS
25.0000 mg | ORAL_TABLET | Freq: Every evening | ORAL | Status: DC | PRN
  Administered 2020-07-09: 25 mg via ORAL
  Filled 2020-07-08 (×2): qty 1

## 2020-07-08 SURGICAL SUPPLY — 26 items
BAG DRAIN CYSTO-URO LG1000N (MISCELLANEOUS) ×2 IMPLANT
BAG URINE DRAIN 2000ML AR STRL (UROLOGICAL SUPPLIES) ×2 IMPLANT
BRUSH SCRUB EZ 1% IODOPHOR (MISCELLANEOUS) ×2 IMPLANT
CATH FOLEY 2WAY  5CC 16FR (CATHETERS) ×1
CATH URETL 5X70 OPEN END (CATHETERS) ×2 IMPLANT
CATH URTH 16FR FL 2W BLN LF (CATHETERS) ×1 IMPLANT
CNTNR SPEC 2.5X3XGRAD LEK (MISCELLANEOUS) ×1
CONT SPEC 4OZ STER OR WHT (MISCELLANEOUS) ×1
CONTAINER SPEC 2.5X3XGRAD LEK (MISCELLANEOUS) ×1 IMPLANT
GLOVE BIOGEL PI IND STRL 7.5 (GLOVE) ×2 IMPLANT
GLOVE BIOGEL PI INDICATOR 7.5 (GLOVE) ×2
GOWN STRL REUS W/ TWL XL LVL3 (GOWN DISPOSABLE) ×2 IMPLANT
GOWN STRL REUS W/TWL XL LVL3 (GOWN DISPOSABLE) ×2
GUIDEWIRE STR DUAL SENSOR (WIRE) ×2 IMPLANT
KIT TURNOVER CYSTO (KITS) ×2 IMPLANT
MANIFOLD NEPTUNE II (INSTRUMENTS) IMPLANT
PACK CYSTO AR (MISCELLANEOUS) ×2 IMPLANT
SET CYSTO W/LG BORE CLAMP LF (SET/KITS/TRAYS/PACK) ×2 IMPLANT
SOL .9 NS 3000ML IRR  AL (IV SOLUTION) ×1
SOL .9 NS 3000ML IRR UROMATIC (IV SOLUTION) ×1 IMPLANT
STENT URET 6FRX22 CONTOUR (STENTS) ×2 IMPLANT
STENT URET 6FRX24 CONTOUR (STENTS) IMPLANT
STENT URET 6FRX26 CONTOUR (STENTS) IMPLANT
SURGILUBE 2OZ TUBE FLIPTOP (MISCELLANEOUS) ×2 IMPLANT
SYR 20ML LL LF (SYRINGE) ×2 IMPLANT
WATER STERILE IRR 1000ML POUR (IV SOLUTION) ×2 IMPLANT

## 2020-07-08 NOTE — Progress Notes (Signed)
Urology Inpatient Progress Note  Subjective: WBC count up today, 14.1.  Other morning labs pending.  She is afebrile and stably hypotensive. Urine culture pending, on antibiotics as below. Patient reports feeling better today compared to yesterday.  However, she does report some difficulty moving her right leg at the hip.  She denies impaired sensation of the RLE and is able to wiggle toes on command.  Anti-infectives: Anti-infectives (From admission, onward)   Start     Dose/Rate Route Frequency Ordered Stop   07/08/20 0015  cefTRIAXone (ROCEPHIN) 1 g in sodium chloride 0.9 % 100 mL IVPB  Status:  Discontinued        1 g 200 mL/hr over 30 Minutes Intravenous Every 24 hours 07/08/20 0002 07/08/20 0234   07/07/20 2200  cefTRIAXone (ROCEPHIN) 1 g in sodium chloride 0.9 % 100 mL IVPB        1 g 200 mL/hr over 30 Minutes Intravenous Every 24 hours 07/07/20 2158        Current Facility-Administered Medications  Medication Dose Route Frequency Provider Last Rate Last Admin  . 0.9 %  sodium chloride infusion   Intravenous Continuous Mansy, Arvella Merles, MD 125 mL/hr at 07/08/20 0437 Infusion Verify at 07/08/20 0437  . acetaminophen (TYLENOL) tablet 650 mg  650 mg Oral Q6H PRN Mansy, Jan A, MD   650 mg at 07/08/20 0304   Or  . acetaminophen (TYLENOL) suppository 650 mg  650 mg Rectal Q6H PRN Mansy, Jan A, MD      . ascorbic acid (VITAMIN C) tablet 500 mg  500 mg Oral Daily Mansy, Jan A, MD      . cefTRIAXone (ROCEPHIN) 1 g in sodium chloride 0.9 % 100 mL IVPB  1 g Intravenous Q24H Nance Pear, MD   Stopped at 07/07/20 2335  . enoxaparin (LOVENOX) injection 40 mg  40 mg Subcutaneous Q24H Mansy, Jan A, MD      . ketorolac (TORADOL) 15 MG/ML injection 15 mg  15 mg Intravenous Q6H PRN Mansy, Jan A, MD      . lactated ringers infusion   Intravenous Continuous Nance Pear, MD      . magnesium hydroxide (MILK OF MAGNESIA) suspension 30 mL  30 mL Oral Daily PRN Mansy, Jan A, MD      . ondansetron  [facility]) tablet 4 mg  4 mg Oral Q6H PRN Mansy, Jan A, MD       Or  . ondansetron [facility]) injection 4 mg  4 mg Intravenous Q6H PRN Mansy, Jan A, MD      . traZODone (DESYREL) tablet 25 mg  25 mg Oral QHS PRN Mansy, Arvella Merles, MD       Objective: Vital signs in last 24 hours: Temp:  [97.5 F (36.4 C)-98.9 F (37.2 C)] 98.3 F (36.8 C) (01/11 0752) Pulse Rate:  [90-119] 92 (01/11 0752) Resp:  [16-18] 18 (01/11 0752) BP: (75-120)/(50-69) 91/50 (01/11 0752) SpO2:  [95 %-99 %] 99 % (01/11 0752) Weight:  [49.9 kg] 49.9 kg (01/10 1926)  Intake/Output from previous day: 01/10 0701 - 01/11 0700 In: 903 [I.V.:903] Out: 300 [Urine:300] Intake/Output this shift: No intake/output data recorded.  Physical Exam Vitals and nursing note reviewed.  Constitutional:      General: She is not in acute distress.    Appearance: She is not ill-appearing, toxic-appearing or diaphoretic.  HENT:     Head: Normocephalic and atraumatic.  Pulmonary:     Effort: Pulmonary effort is normal. No respiratory distress.  Skin:    General: Skin is warm and dry.  Neurological:     Mental Status: She is alert and oriented to person, place, and time.     Sensory: Sensation is intact.  Psychiatric:        Mood and Affect: Mood normal.        Behavior: Behavior normal.    Lab Results:  Recent Labs    07/07/20 1933 07/08/20 0544  WBC 2.6* 14.1*  HGB 13.6 11.0*  HCT 42.8 33.7*  PLT 200 147*   BMET Recent Labs    07/07/20 1933  NA 145  K 3.6  CL 105  CO2 27  GLUCOSE 131*  BUN 30*  CREATININE 0.92  CALCIUM 9.2   PT/INR Recent Labs    07/08/20 0544  LABPROT 13.7  INR 1.1   Assessment & Plan: 80 year old female s/p left ureteral stent placement with Dr. Bernardo Heater for management of a distal left ureteral stone with urosepsis.  We discussed the need for follow-up ureteroscopy with laser lithotripsy and stent exchange in 2 to 3 weeks after completion of culture appropriate antibiotics.  Patient  expressed understanding of this plan.  Unclear etiology of difficulty moving right lower extremity.  Sensation and distal motor function are intact on physical exam today.  Suspect joint stiffness, however shared patient reports with Dr. [place] Pink for further evaluation as needed.  Recommendations: -Discontinue Foley catheter tomorrow if she remains afebrile -Follow urine cultures and continue empiric antibiotics.  She will require 14 days of culture appropriate therapy. -Outpatient ureteroscopy with laser lithotripsy and stent exchange for definitive stone management with Dr. Bernardo Heater in 2 to 3 weeks  Debroah Loop, PA-C 07/08/2020

## 2020-07-08 NOTE — Anesthesia Preprocedure Evaluation (Addendum)
Anesthesia Evaluation  Patient identified by MRN, date of birth, ID band Patient awake    Reviewed: Allergy & Precautions, H&P , NPO status , Patient's Chart, lab work & pertinent test results  History of Anesthesia Complications Negative for: history of anesthetic complications  Airway Mallampati: III  TM Distance: >3 FB Neck ROM: full    Dental  (+) Chipped, Poor Dentition, Missing   Pulmonary neg pulmonary ROS, neg shortness of breath,    Pulmonary exam normal        Cardiovascular Exercise Tolerance: Good (-) angina(-) Past MI and (-) DOE negative cardio ROS Normal cardiovascular exam     Neuro/Psych Seizures:    negative neurological ROS  negative psych ROS   GI/Hepatic negative GI ROS, Neg liver ROS, neg GERD  ,  Endo/Other  negative endocrine ROS  Renal/GU      Musculoskeletal  (+) Arthritis ,   Abdominal   Peds  Hematology negative hematology ROS (+)   Anesthesia Other Findings Urosepsis   Past Medical History: No date: History of kidney stones     Comment:  30+ years ago No date: Osteoporosis  History reviewed. No pertinent surgical history.  BMI    Body Mass Index: 17.23 kg/m      Reproductive/Obstetrics negative OB ROS                            Anesthesia Physical Anesthesia Plan  ASA: III and emergent  Anesthesia Plan: General ETT   Post-op Pain Management:    Induction: Intravenous  PONV Risk Score and Plan: Ondansetron, Dexamethasone, Midazolam and Treatment may vary due to age or medical condition  Airway Management Planned: Oral ETT  Additional Equipment:   Intra-op Plan:   Post-operative Plan: Extubation in OR  Informed Consent: I have reviewed the patients History and Physical, chart, labs and discussed the procedure including the risks, benefits and alternatives for the proposed anesthesia with the patient or authorized representative who has  indicated his/her understanding and acceptance.     Dental Advisory Given  Plan Discussed with: Anesthesiologist, CRNA and Surgeon  Anesthesia Plan Comments: (Patient consented for risks of anesthesia including but not limited to:  - adverse reactions to medications - damage to eyes, teeth, lips or other oral mucosa - nerve damage due to positioning  - sore throat or hoarseness - Damage to heart, brain, nerves, lungs, other parts of body or loss of life  Patient voiced understanding.)        Anesthesia Quick Evaluation

## 2020-07-08 NOTE — Anesthesia Procedure Notes (Signed)
Procedure Name: Intubation Date/Time: 07/08/2020 12:43 AM Performed by: Tywana Robotham, CRNA Pre-anesthesia Checklist: Patient identified, Patient being monitored, Timeout performed, Emergency Drugs available and Suction available Patient Re-evaluated:Patient Re-evaluated prior to induction Oxygen Delivery Method: Circle system utilized Preoxygenation: Pre-oxygenation with 100% oxygen Induction Type: IV induction Ventilation: Mask ventilation without difficulty Laryngoscope Size: 3 and McGraph Grade View: Grade I Tube type: Oral Tube size: 7.0 mm Number of attempts: 1 Airway Equipment and Method: Stylet Placement Confirmation: ETT inserted through vocal cords under direct vision,  positive ETCO2 and breath sounds checked- equal and bilateral Secured at: 19 cm Tube secured with: Tape Dental Injury: Teeth and Oropharynx as per pre-operative assessment        

## 2020-07-08 NOTE — Op Note (Signed)
Preoperative diagnosis:  1. Obstructing left distal ureteral calculus 2. Sepsis secondary to above  Postoperative diagnosis:  1. Same  Procedure:  1. Cystoscopy 2. left ureteral stent placement (6FR/22 cm) 3. left retrograde pyelography with interpretation   Surgeon: Nicki Reaper C. Stoioff, M.D.  Anesthesia: General  Complications: None  Intraoperative findings:  1.  Left retrograde pyelogram with moderate to severe hydronephrosis and hydroureter to the distal ureter  EBL: Minimal  Specimens: Urine from the left renal pelvis for Gram stain/culture  Indication: Amanda Bruce is a 80 y.o. female presenting to the ED last night with complaints of bilateral back pain L >R.  She was tachycardic and hypotensive with an elevated lactic acid.  Urinalysis showed pyuria and microhematuria.  Stone protocol CT with severe left hydronephrosis/hydroureter to a 2 mm distal ureteral calculus.  After reviewing the management options for treatment, he elected to proceed with the above surgical procedure(s). We have discussed the potential benefits and risks of the procedure, side effects of the proposed treatment, the likelihood of the patient achieving the goals of the procedure, and any potential problems that might occur during the procedure or recuperation. Informed consent has been obtained.  Description of procedure:  The patient was taken to the operating room and general anesthesia was induced.  The patient was placed in the dorsal lithotomy position, prepped and draped in the usual sterile fashion, and preoperative antibiotics were administered. A preoperative time-out was performed.   A 21 [place] scope with obturator was lubricated and passed per urethra.  Panendoscopy was then performed and there was patchy erythema of the bladder mucosa consistent with cystitis.  There were areas of cystitis follicularis of the trigone.  The ureteral orifice ease were normal-appearing bilaterally.  A 0.038  sensor wire was placed through the cystoscope and into the left ureteral orifice once the wire was advanced several centimeters there was brisk efflux of dark, cloudy urine.  The guidewire was advanced to the region of the renal pelvis under fluoroscopic guidance.  A 5 French open-ended ureteral catheter was then placed over the guidewire and advanced to the renal pelvis.  The guidewire was removed and 20 cc of murky urine was aspirated and sent for culture/Gram stain  Retrograde pyelogram was then performed with findings as described above.  The guidewire was replaced and the ureteral catheter was removed.  A 6FR/22 cm Contour ureteral stent was then advanced over the guidewire and without difficulty.  Under fluoroscopic guidance the proximal curl was well positioned in the renal pelvis.  The guidewire was removed and the distal curl was in good position under direct vision.  The cystoscope was removed.  A 16 French Foley catheter was placed to achieve maximal drainage.  Plan:  She has been admitted to the hospitalist service for IV antibiotics  DC Foley catheter 36 hours   [name], MD

## 2020-07-08 NOTE — Progress Notes (Signed)
PROGRESS NOTE  Amanda Bruce [id number] DOB: 27-Sep-1940 DOA: 07/07/2020 PCP: Valerie Roys, DO  HPI/Recap of past 14 hours: 80 year old female with past medical history only of kidney stones presented to the emergency room on 1/10 with several days of low back pain plus fever and nausea that had started that afternoon.  In the emergency room, work-up revealed patient was in septic shock secondary to UTI which have been caused by left-sided kidney stones that had nearly completely obstructed her left kidney.  Urology was consulted and patient was started on IV antibiotics and fluids.  Patient was taken for urgent cystoscopy with left ureteral stent placement.  Because of her sepsis, stone was not removed at that time.  Procedure was done at approximately midnight and patient was then admitted to her room on the hospital floor approximately 2:30 AM.  Patient states that since then, she has been feeling weak complaining of left thigh pain that makes it incredibly difficult to raise her leg in any capacity.  She denies loss of sensation and can move her foot fine, but states it is too painful to lift her leg up.  No other complaints.  Assessment/Plan: Principal Problem:   Septic shock ([place]) secondary to UTI: Patient meets criteria for septic shock on admission given urinary source, lactic acidosis as well as hypotension.  Urinary tract infection caused by left ureteral obstruction from kidney stone.  Status post left ureteral stent.  Continue antibiotics and fluids.  Stone is still present at some point believe the need to be extracted or undergo lithotripsy once sepsis is stabilized. Active Problems:   Hypercholesteremia    Right leg weakness: Unclear etiology.  Possibly rhabdomyolysis secondary to infection?  Leg is not really tender and while it might be sore I wonder if it is soreness: Weakness or weakness leading to decreased movement leading to soreness.  No loss of sensation and no  pain elsewhere.  We will try trial of pain medications and if this does not improve her strength, will get PT eval and do neurological work-up.    Cardiomegaly: Checking BNP.  Patient has no history of hypertension or heart issues.   Code Status: Full code  Family Communication: Son at the bedside  Disposition Plan: Home once sepsis stabilized, cardiomegaly worked up and etiology of leg weakness discomfort and plan for renal stone set   Consultants:  Urology  Procedures:  Left ureteral stent placement  Antimicrobials:  IV Rocephin 1/10-present  DVT prophylaxis: Lovenox   Objective: Vitals:   07/08/20 0752 07/08/20 1100  BP: (!) 91/50 (!) 94/50  Pulse: 92 98  Resp: 18 16  Temp: 98.3 F (36.8 C) 98 F (36.7 C)  SpO2: 99% 97%    Intake/Output Summary (Last 24 hours) at 07/08/2020 1445 Last data filed at 07/08/2020 1405 Gross per 24 hour  Intake 2205.67 ml  Output 300 ml  Net 1905.67 ml   Filed Weights   07/07/20 1926  Weight: 49.9 kg   Body mass index is 17.23 kg/m.  Exam:   General: Alert and oriented x3, no acute distress  HEENT: Extraocular atraumatic, mucous membranes are moist  Cardiovascular: Regular rate and rhythm, S1-S2  Respiratory: Clear to auscultation bilaterally  Abdomen: Soft, nontender, nondistended, positive bowel sounds  Musculoskeletal: No clubbing or cyanosis or edema.  Patient with normal straight leg raising and observation on left.  On the right side, only able to raise right leg with assistance.  Minimal tenderness.  Skin: No skin breaks,  tears or lesions  Psychiatry: Downgoing toes bilaterally.  No loss of sensation in either leg.   Data Reviewed: CBC: Recent Labs  Lab 07/07/20 1933 07/08/20 0544  WBC 2.6* 14.1*  HGB 13.6 11.0*  HCT 42.8 33.7*  MCV 99.1 96.3  PLT 200 474*   Basic Metabolic Panel: Recent Labs  Lab 07/07/20 1933 07/08/20 0544  NA 145 137  K 3.6 3.1*  CL 105 100  CO2 27 24  GLUCOSE 131* 94   BUN 30* 32*  CREATININE 0.92 0.97  CALCIUM 9.2 8.1*   GFR: Estimated Creatinine Clearance: 37 mL/min (by C-G formula based on SCr of 0.97 mg/dL). Liver Function Tests: Recent Labs  Lab 07/07/20 1933  AST 159*  ALT 62*  ALKPHOS 134*  BILITOT 1.2  PROT 7.3  ALBUMIN 4.0   Recent Labs  Lab 07/07/20 1933  LIPASE 25   No results for input(s): AMMONIA in the last 168 hours. Coagulation Profile: Recent Labs  Lab 07/08/20 0544  INR 1.1   Cardiac Enzymes: No results for input(s): CKTOTAL, CKMB, CKMBINDEX, TROPONINI in the last 168 hours. BNP (last 3 results) No results for input(s): PROBNP in the last 8760 hours. HbA1C: No results for input(s): HGBA1C in the last 72 hours. CBG: No results for input(s): GLUCAP in the last 168 hours. Lipid Profile: No results for input(s): CHOL, HDL, LDLCALC, TRIG, CHOLHDL, LDLDIRECT in the last 72 hours. Thyroid Function Tests: No results for input(s): TSH, T4TOTAL, FREET4, T3FREE, THYROIDAB in the last 72 hours. Anemia Panel: No results for input(s): VITAMINB12, FOLATE, FERRITIN, TIBC, IRON, RETICCTPCT in the last 72 hours. Urine analysis:    Component Value Date/Time   COLORURINE YELLOW (A) 07/07/2020 1933   APPEARANCEUR CLOUDY (A) 07/07/2020 1933   APPEARANCEUR Clear 06/14/2019 0947   LABSPEC 1.011 07/07/2020 1933   PHURINE 5.0 07/07/2020 1933   GLUCOSEU NEGATIVE 07/07/2020 1933   HGBUR MODERATE (A) 07/07/2020 1933   BILIRUBINUR NEGATIVE 07/07/2020 1933   BILIRUBINUR Negative 06/14/2019 [place] 07/07/2020 1933   PROTEINUR 30 (A) 07/07/2020 1933   NITRITE POSITIVE (A) 07/07/2020 1933   LEUKOCYTESUR TRACE (A) 07/07/2020 1933   Sepsis Labs: @LABRCNTIP (procalcitonin:4,lacticidven:4)  ) Recent Results (from the past 240 hour(s))  Novel Coronavirus, NAA ([facility])     Status: None   Collection Time: 07/03/20  9:01 AM   Specimen: Nasopharyngeal(NP) swabs in vial transport medium  Result Value Ref Range Status    SARS-CoV-2, NAA Not Detected Not Detected Final    Comment: This nucleic acid amplification test was developed and its performance characteristics determined by [employer]. Nucleic acid amplification tests include RT-PCR and TMA. This test has not been FDA cleared or approved. This test has been authorized by FDA under an Emergency Use Authorization (EUA). This test is only authorized for the duration of time the declaration that circumstances exist justifying the authorization of the emergency use of in vitro diagnostic tests for detection of SARS-CoV-2 virus and/or diagnosis of COVID-19 infection under section 564(b)(1) of the Act, 21 U.S.C. 259DGL-8(V) (1), unless the authorization is terminated or revoked sooner. When diagnostic testing is negative, the possibility of a false negative result should be considered in the context of a patient's recent exposures and the presence of clinical signs and symptoms consistent with COVID-19. An individual without symptoms of COVID-19 and who is not shedding SARS-CoV-2 virus wo uld expect to have a negative (not detected) result in this assay.   SARS-COV-2, NAA 2 DAY TAT  Status: None   Collection Time: 07/03/20  9:01 AM  Result Value Ref Range Status   SARS-CoV-2, NAA 2 DAY TAT Performed  Final  Resp Panel by RT-PCR (Flu A&B, Covid) Nasopharyngeal Swab     Status: None   Collection Time: 07/07/20 10:56 PM   Specimen: Nasopharyngeal Swab; Nasopharyngeal(NP) swabs in vial transport medium  Result Value Ref Range Status   SARS Coronavirus 2 by RT PCR NEGATIVE NEGATIVE Final    Comment: (NOTE) SARS-CoV-2 target nucleic acids are NOT DETECTED.  The SARS-CoV-2 RNA is generally detectable in upper respiratory specimens during the acute phase of infection. The lowest concentration of SARS-CoV-2 viral copies this assay can detect is 138 copies/mL. A negative result does not preclude SARS-Cov-2 infection and should not be used as the  sole basis for treatment or other patient management decisions. A negative result may occur with  improper specimen collection/handling, submission of specimen other than nasopharyngeal swab, presence of viral mutation(s) within the areas targeted by this assay, and inadequate number of viral copies(<138 copies/mL). A negative result must be combined with clinical observations, patient history, and epidemiological information. The expected result is Negative.  Fact Sheet for Patients:  EntrepreneurPulse.com.au  Fact Sheet for Healthcare Providers:  IncredibleEmployment.be  This test is no t yet approved or cleared by the [place] FDA and  has been authorized for detection and/or diagnosis of SARS-CoV-2 by FDA under an Emergency Use Authorization (EUA). This EUA will remain  in effect (meaning this test can be used) for the duration of the COVID-19 declaration under Section 564(b)(1) of the Act, 21 U.S.C.section 360bbb-3(b)(1), unless the authorization is terminated  or revoked sooner.       Influenza A by PCR NEGATIVE NEGATIVE Final   Influenza B by PCR NEGATIVE NEGATIVE Final    Comment: (NOTE) The Xpert Xpress SARS-CoV-2/FLU/RSV plus assay is intended as an aid in the diagnosis of influenza from Nasopharyngeal swab specimens and should not be used as a sole basis for treatment. Nasal washings and aspirates are unacceptable for Xpert Xpress SARS-CoV-2/FLU/RSV testing.  Fact Sheet for Patients: EntrepreneurPulse.com.au  Fact Sheet for Healthcare Providers: IncredibleEmployment.be  This test is not yet approved or cleared by the [place] FDA and has been authorized for detection and/or diagnosis of SARS-CoV-2 by FDA under an Emergency Use Authorization (EUA). This EUA will remain in effect (meaning this test can be used) for the duration of the COVID-19 declaration under Section 564(b)(1) of the  Act, 21 U.S.C. section 360bbb-3(b)(1), unless the authorization is terminated or revoked.  Performed at [facility], [address] [place], [place] [address]       Studies: DG OR UROLOGY CYSTO IMAGE [facility] ONLY)  Result Date: 07/08/2020 There is no interpretation for this exam.  This order is for images obtained during a surgical procedure.  Please See "Surgeries" Tab for more information regarding the procedure.   CT Renal Stone Study  Result Date: 07/07/2020 CLINICAL DATA:  Left flank pain EXAM: CT ABDOMEN AND PELVIS WITHOUT CONTRAST TECHNIQUE: Multidetector CT imaging of the abdomen and pelvis was performed following the standard protocol without IV contrast. COMPARISON:  None. FINDINGS: Lower chest: Calcifications in the lung bases compatible with old granulomatous disease. Cardiomegaly. No effusions. Hepatobiliary: No focal hepatic abnormality. Gallbladder unremarkable. Pancreas: No focal abnormality or ductal dilatation. Spleen: No focal abnormality.  Normal size. Adrenals/Urinary Tract: Severe left hydronephrosis and hydroureter due to 2 mm distal left ureteral stone and 2 mm left UVJ stone. Bilateral  nonobstructing renal stones. Upper pole cyst on the left measures 4.7 cm. Stomach/Bowel: Moderate stool burden throughout the colon. No bowel obstruction. Vascular/Lymphatic: No evidence of aneurysm or adenopathy. Reproductive: Uterus and adnexa unremarkable.  No mass. Other: No free fluid or free air. Musculoskeletal: No acute bony abnormality. IMPRESSION: Severe left hydronephrosis due to 2 distal left ureteral stones, both 2 mm. Bilateral nephrolithiasis. Electronically Signed   By: Rolm Baptise M.D.   On: 07/07/2020 22:42    Scheduled Meds: . ascorbic acid  500 mg Oral Daily  . enoxaparin (LOVENOX) injection  40 mg Subcutaneous Q24H    Continuous Infusions: . sodium chloride 125 mL/hr at 07/08/20 1405  . cefTRIAXone (ROCEPHIN)  IV Stopped (07/07/20 2335)  . lactated  ringers       LOS: 1 day     Annita Brod, MD [facility]   07/08/2020, 2:45 PM

## 2020-07-08 NOTE — Transfer of Care (Signed)
Immediate Anesthesia Transfer of Care Note  Patient: Amanda Bruce  Procedure(s) Performed: CYSTOSCOPY WITH STENT PLACEMENT (Left )  Patient Location: PACU  Anesthesia Type:General  Level of Consciousness: sedated and patient cooperative  Airway & Oxygen Therapy: Patient Spontanous Breathing and Patient connected to nasal cannula oxygen  Post-op Assessment: Report given to RN and Post -op Vital signs reviewed and stable  Post vital signs: Reviewed and stable  Last Vitals:  Vitals Value Taken Time  BP    Temp 36.4 C 07/08/20 0127  Pulse    Resp    SpO2      Last Pain:  Vitals:   07/07/20 2317  TempSrc:   PainSc: 0-No pain         Complications: No complications documented.

## 2020-07-08 NOTE — Anesthesia Procedure Notes (Signed)
Procedure Name: Intubation Date/Time: 07/08/2020 12:43 AM Performed by: Lendon Colonel, CRNA Pre-anesthesia Checklist: Patient identified, Patient being monitored, Timeout performed, Emergency Drugs available and Suction available Patient Re-evaluated:Patient Re-evaluated prior to induction Oxygen Delivery Method: Circle system utilized Preoxygenation: Pre-oxygenation with 100% oxygen Induction Type: IV induction Ventilation: Mask ventilation without difficulty Laryngoscope Size: 3 and McGraph Grade View: Grade I Tube type: Oral Tube size: 7.0 mm Number of attempts: 1 Airway Equipment and Method: Stylet Placement Confirmation: ETT inserted through vocal cords under direct vision,  positive ETCO2 and breath sounds checked- equal and bilateral Secured at: 19 cm Tube secured with: Tape Dental Injury: Teeth and Oropharynx as per pre-operative assessment

## 2020-07-08 NOTE — ED Notes (Signed)
Pt has been changed out of clothes and into a gown. Pt going to OR at this time.

## 2020-07-08 NOTE — Evaluation (Signed)
Physical Therapy Evaluation Patient Details Name: Amanda Bruce MRN: [id number] DOB: 1940-09-21 Today's Date: 07/08/2020   History of Present Illness  Per MD notes: Pt is a 80 year old female with past medical history only of kidney stones presented to the emergency room on 1/10 with several days of low back pain plus fever and nausea that had started that afternoon.  In the emergency room, work-up revealed patient was in septic shock secondary to UTI which have been caused by left-sided kidney stones that had nearly completely obstructed her left kidney.  Urology was consulted and patient was started on IV antibiotics and fluids.  Patient was taken for urgent cystoscopy with left ureteral stent placement.  Because of her sepsis, stone was not removed.  Assessment also includes RLE weakness of unclear etiology and cardiomegaly.  PMH includes osteoporosis.    Clinical Impression  Pt was pleasant and motivated to participate during the session.  Pt found on 1LO2/min with SpO2 in the mid 90s with nursing giving OK for trial on room air.  Pt taken through graded therex with cues for proper breathing technique with SpO2 monitored on room air throughout, remained in the low to mid 90s.  Pt had pain in her R hip and corresponding difficulty moving her RLE.  Pt was able to stand and ambulate without physical assistance but her max amb distance was only 10 feet secondary to pt beginning to feel "lightheaded/dizzy" and returned to sitting.  Pt's BP taken in sitting at 95/49, almost exactly what her previous charted BP was.  Of note, even before beginning to c/o adverse symptoms while walking the pt ambulated with a very slow, effortful gait pattern, much different than the patient's baseline.  At baseline the pt is highly active including dancing in the community 3x/wk.  Pt will benefit from PT services in a SNF setting upon discharge to safely address deficits listed in patient problem list for decreased  caregiver assistance and eventual return to PLOF.      Follow Up Recommendations SNF;Supervision for mobility/OOB    Equipment Recommendations  Rolling walker with 5" wheels    Recommendations for Other Services       Precautions / Restrictions Precautions Precautions: Fall Restrictions Weight Bearing Restrictions: No      Mobility  Bed Mobility Overal bed mobility: Needs Assistance Bed Mobility: Supine to Sit     Supine to sit: Min assist     General bed mobility comments: Min A for RLE control and to come to full upright sitting position    Transfers Overall transfer level: Needs assistance Equipment used: Rolling walker (2 wheeled) Transfers: Sit to/from Stand Sit to Stand: Min guard         General transfer comment: Min verbal cues for sequencing  Ambulation/Gait Ambulation/Gait assistance: Min guard Gait Distance (Feet): 10 Feet Assistive device: Rolling walker (2 wheeled) Gait Pattern/deviations: Step-through pattern;Decreased step length - right;Decreased step length - left Gait velocity: decreased   General Gait Details: Pt able to amb a max of 10' with very slow, cautious cadence with c/o mild dizziness/lightheadedness; BP taken in sitting at 95/49  Stairs            Wheelchair Mobility    Modified Rankin (Stroke Patients Only)       Balance Overall balance assessment: Needs assistance   Sitting balance-Leahy Scale: Good     Standing balance support: Bilateral upper extremity supported;During functional activity Standing balance-Leahy Scale: Good  Pertinent Vitals/Pain Pain Assessment: 0-10 Pain Score: 5  Pain Location: R hip Pain Descriptors / Indicators: Sore Pain Intervention(s): Premedicated before session;Monitored during session    Home Living Family/patient expects to be discharged to:: Private residence Living Arrangements: Alone Available Help at Discharge:  Family;Available PRN/intermittently Type of Home: House Home Access: Stairs to enter Entrance Stairs-Rails: Right Entrance Stairs-Number of Steps: 4 Home Layout: One level Home Equipment: Shower seat - built in Additional Comments: Son works Medical laboratory scientific officer, only available to assist intermittently    Prior Function Level of Independence: Independent         Comments: Pt Ind with amb community distances without an AD, dances 3x/wk, Ind with all ADLs, no fall history     Hand Dominance        Extremity/Trunk Assessment   Upper Extremity Assessment Upper Extremity Assessment: Overall WFL for tasks assessed    Lower Extremity Assessment Lower Extremity Assessment: RLE deficits/detail;LLE deficits/detail RLE Deficits / Details: RLE hip flex < 3/5, limited by pain RLE: Unable to fully assess due to pain LLE Deficits / Details: LLE strength WNL       Communication   Communication: No difficulties  Cognition Arousal/Alertness: Awake/alert Behavior During Therapy: WFL for tasks assessed/performed Overall Cognitive Status: Within Functional Limits for tasks assessed                                        General Comments      Exercises Total Joint Exercises Ankle Circles/Pumps: AROM;Strengthening;Both;10 reps (with manual resistance) Quad Sets: Strengthening;Both;10 reps Gluteal Sets: Strengthening;Both;10 reps Heel Slides: AROM;Strengthening;Both;5 reps Hip ABduction/ADduction: AAROM;Strengthening;Both;10 reps (AAROM on the RLE) Straight Leg Raises: AAROM;Strengthening;Both;10 reps (AAROM on the RLE) Long Arc Quad: AROM;Strengthening;Both;5 reps;10 reps Knee Flexion: AROM;Strengthening;Both;5 reps;10 reps   Assessment/Plan    PT Assessment Patient needs continued PT services  PT Problem List Decreased strength;Decreased activity tolerance;Decreased balance;Decreased mobility;Decreased knowledge of use of DME;Pain       PT Treatment Interventions DME  instruction;Gait training;Stair training;Therapeutic activities;Functional mobility training;Therapeutic exercise;Balance training;Patient/family education    PT Goals (Current goals can be found in the Care Plan section)  Acute Rehab PT Goals Patient Stated Goal: To get stronger and move RLE without pain PT Goal Formulation: With patient Time For Goal Achievement: 07/21/20 Potential to Achieve Goals: Good    Frequency Min 2X/week   Barriers to discharge Inaccessible home environment;Decreased caregiver support      Co-evaluation               AM-PAC PT "6 Clicks" Mobility  Outcome Measure Help needed turning from your back to your side while in a flat bed without using bedrails?: A Little Help needed moving from lying on your back to sitting on the side of a flat bed without using bedrails?: A Little Help needed moving to and from a bed to a chair (including a wheelchair)?: A Little Help needed standing up from a chair using your arms (e.g., wheelchair or bedside chair)?: A Little Help needed to walk in hospital room?: A Little Help needed climbing 3-5 steps with a railing? : A Lot 6 Click Score: 17    End of Session Equipment Utilized During Treatment: Gait belt Activity Tolerance: Treatment limited secondary to medical complications (Comment) (Amb limited by mild dizziness/lightheadedness) Patient left: in chair;with call bell/phone within reach;with chair alarm set;with family/visitor present;with nursing/sitter in room Nurse Communication: Mobility status;Other (  comment) (Pt response to amb per above) PT Visit Diagnosis: Difficulty in walking, not elsewhere classified (R26.2);Muscle weakness (generalized) (M62.81);Pain Pain - Right/Left: Right Pain - part of body: Hip    Time: [id number] PT Time Calculation (min) (ACUTE ONLY): 47 min   Charges:   PT Evaluation $PT Eval Moderate Complexity: 1 Mod PT Treatments $Therapeutic Exercise: 23-37 mins        D. Royetta Asal PT, DPT 07/08/20, 4:29 PM

## 2020-07-08 NOTE — Anesthesia Postprocedure Evaluation (Signed)
Anesthesia Post Note  Patient: Amanda Bruce  Procedure(s) Performed: CYSTOSCOPY WITH STENT PLACEMENT (Left )  Patient location during evaluation: PACU Anesthesia Type: General Level of consciousness: awake and alert Pain management: pain level controlled Vital Signs Assessment: post-procedure vital signs reviewed and stable Respiratory status: spontaneous breathing, nonlabored ventilation, respiratory function stable and patient connected to nasal cannula oxygen Cardiovascular status: blood pressure returned to baseline and stable Postop Assessment: no apparent nausea or vomiting Anesthetic complications: no   No complications documented.   Last Vitals:  Vitals:   07/08/20 0321 07/08/20 0431  BP: (!) 105/54 (!) 96/56  Pulse: 92 98  Resp: 18 18  Temp: 36.7 C 36.8 C  SpO2: 97% 99%    Last Pain:  Vitals:   07/08/20 0431  TempSrc: Oral  PainSc:                  Precious Haws Harmonee Tozer

## 2020-07-08 NOTE — Consult Note (Signed)
Urology Consult  Requesting physician: Dr. Sidney Ace  Reason for consultation: Ureteral calculi with severe sepsis  Chief Complaint: Flank pain  History of Present Illness: Amanda Bruce is a 80 y.o. female who presented to the ED last night with a 2-day history of back pain and weakness and fever today.  Complains of bilateral back pain but worse in the left flank. No identifiable precipitating, aggravating or alleviating factors.  Evaluation in the ED remarkable for tachycardia with mild hypotension which decreased to 75/59.  Lactic acid elevated at 4.5.  Urinalysis nitrite positive with 11-20 RBC/11-20 WBC  Stone protocol CT remarkable for severe left hydronephrosis/hydroureter to a 2 mm left distal ureteral calculus with a second 2 mm calculus just proximal.  Prior history of nephrolithiasis 30+ years ago  Past Medical History:  Diagnosis Date   History of kidney stones    30+ years ago   Osteoporosis     History reviewed. No pertinent surgical history.  Home Medications:  No outpatient medications have been marked as taking for the 07/07/20 encounter [facility] Encounter).    Allergies: No Known Allergies  Family History  Problem Relation Age of Onset   Hypertension Mother    Cancer Father    Hypertension Sister    Cancer Brother    Breast cancer Other     Social History:  reports that she has never smoked. She has never used smokeless tobacco. She reports that she does not drink alcohol and does not use drugs.  ROS: A complete review of systems was performed.  All systems are negative except for pertinent findings as noted.  Physical Exam:  Vital signs in last 24 hours: Temp:  [98.7 F (37.1 C)] 98.7 F (37.1 C) (01/10 1926) Pulse Rate:  [95-119] 95 (01/10 2317) Resp:  [16-18] 18 (01/10 2317) BP: (75-120)/(56-69) 120/69 (01/10 2317) SpO2:  [95 %-98 %] 95 % (01/10 2317) Weight:  [49.9 kg] 49.9 kg (01/10 1926) Constitutional:  Alert and oriented, No  acute distress HEENT: [place] AT, moist mucus membranes.  Trachea midline, no masses Cardiovascular: Regular rate and rhythm, no clubbing, cyanosis, or edema. Respiratory: Normal respiratory effort, lungs clear bilaterally GI: Abdomen is soft, nontender, nondistended, no abdominal masses GU: No CVA tenderness Skin: No rashes, bruises or suspicious lesions Lymph: No cervical or inguinal adenopathy Neurologic: Grossly intact, no focal deficits, moving all 4 extremities Psychiatric: Normal mood and affect   Laboratory Data:  Recent Labs    07/07/20 1933  WBC 2.6*  HGB 13.6  HCT 42.8   Recent Labs    07/07/20 1933  NA 145  K 3.6  CL 105  CO2 27  GLUCOSE 131*  BUN 30*  CREATININE 0.92  CALCIUM 9.2   No results for input(s): LABPT, INR in the last 72 hours. No results for input(s): LABURIN in the last 72 hours. Results for orders placed or performed during the hospital encounter of 07/07/20  Resp Panel by RT-PCR (Flu A&B, Covid) Nasopharyngeal Swab     Status: None   Collection Time: 07/07/20 10:56 PM   Specimen: Nasopharyngeal Swab; Nasopharyngeal(NP) swabs in vial transport medium  Result Value Ref Range Status   SARS Coronavirus 2 by RT PCR NEGATIVE NEGATIVE Final    Comment: (NOTE) SARS-CoV-2 target nucleic acids are NOT DETECTED.  The SARS-CoV-2 RNA is generally detectable in upper respiratory specimens during the acute phase of infection. The lowest concentration of SARS-CoV-2 viral copies this assay can detect is 138 copies/mL. A negative  result does not preclude SARS-Cov-2 infection and should not be used as the sole basis for treatment or other patient management decisions. A negative result may occur with  improper specimen collection/handling, submission of specimen other than nasopharyngeal swab, presence of viral mutation(s) within the areas targeted by this assay, and inadequate number of viral copies(<138 copies/mL). A negative result must be combined  with clinical observations, patient history, and epidemiological information. The expected result is Negative.  Fact Sheet for Patients:  EntrepreneurPulse.com.au  Fact Sheet for Healthcare Providers:  IncredibleEmployment.be  This test is no t yet approved or cleared by the [place] FDA and  has been authorized for detection and/or diagnosis of SARS-CoV-2 by FDA under an Emergency Use Authorization (EUA). This EUA will remain  in effect (meaning this test can be used) for the duration of the COVID-19 declaration under Section 564(b)(1) of the Act, 21 U.S.C.section 360bbb-3(b)(1), unless the authorization is terminated  or revoked sooner.       Influenza A by PCR NEGATIVE NEGATIVE Final   Influenza B by PCR NEGATIVE NEGATIVE Final    Comment: (NOTE) The Xpert Xpress SARS-CoV-2/FLU/RSV plus assay is intended as an aid in the diagnosis of influenza from Nasopharyngeal swab specimens and should not be used as a sole basis for treatment. Nasal washings and aspirates are unacceptable for Xpert Xpress SARS-CoV-2/FLU/RSV testing.  Fact Sheet for Patients: EntrepreneurPulse.com.au  Fact Sheet for Healthcare Providers: IncredibleEmployment.be  This test is not yet approved or cleared by the [place] FDA and has been authorized for detection and/or diagnosis of SARS-CoV-2 by FDA under an Emergency Use Authorization (EUA). This EUA will remain in effect (meaning this test can be used) for the duration of the COVID-19 declaration under Section 564(b)(1) of the Act, 21 U.S.C. section 360bbb-3(b)(1), unless the authorization is terminated or revoked.  Performed at [facility], [address] [place], [place] [address]      Radiologic Imaging: CT images were personally reviewed and interpreted   DG OR UROLOGY CYSTO IMAGE ([place])  Result Date: 07/08/2020 There is no interpretation  for this exam.  This order is for images obtained during a surgical procedure.  Please See "Surgeries" Tab for more information regarding the procedure.   CT Renal Stone Study  Result Date: 07/07/2020 CLINICAL DATA:  Left flank pain EXAM: CT ABDOMEN AND PELVIS WITHOUT CONTRAST TECHNIQUE: Multidetector CT imaging of the abdomen and pelvis was performed following the standard protocol without IV contrast. COMPARISON:  None. FINDINGS: Lower chest: Calcifications in the lung bases compatible with old granulomatous disease. Cardiomegaly. No effusions. Hepatobiliary: No focal hepatic abnormality. Gallbladder unremarkable. Pancreas: No focal abnormality or ductal dilatation. Spleen: No focal abnormality.  Normal size. Adrenals/Urinary Tract: Severe left hydronephrosis and hydroureter due to 2 mm distal left ureteral stone and 2 mm left UVJ stone. Bilateral nonobstructing renal stones. Upper pole cyst on the left measures 4.7 cm. Stomach/Bowel: Moderate stool burden throughout the colon. No bowel obstruction. Vascular/Lymphatic: No evidence of aneurysm or adenopathy. Reproductive: Uterus and adnexa unremarkable.  No mass. Other: No free fluid or free air. Musculoskeletal: No acute bony abnormality. IMPRESSION: Severe left hydronephrosis due to 2 distal left ureteral stones, both 2 mm. Bilateral nephrolithiasis. Electronically Signed   By: Rolm Baptise M.D.   On: 07/07/2020 22:42    Impression/Assessment:   1.  Obstructing left distal ureteral calculi with severe hydronephrosis  2.  UTI with sepsis secondary to above   Recommendation:  Urgent cystoscopy with placement left  ureteral stent The procedure was discussed in detail and it was stressed no attempt will be made to remove her calculi due to sepsis.  She will need definitive stone treatment in the future. Potential risks were discussed occluding bleeding, worsening sepsis, ureteral injury.  In the event of an impacted stone and unsuccessful stent  placement she would need percutaneous nephrostomy. All questions were answered and she desires to proceed   07/08/2020, 12:13 AM  [name],  MD

## 2020-07-08 NOTE — Anesthesia Procedure Notes (Signed)
Performed by: Lendon Colonel, CRNA

## 2020-07-08 NOTE — Progress Notes (Signed)
   07/08/20 1746  Vitals  BP (!) 98/58  MAP (mmHg) 71  BP Method Automatic  Pulse Rate 92  MEWS COLOR  MEWS Score Color Green  Oxygen Therapy  SpO2 97 %  MEWS Score  MEWS Temp 0  MEWS Systolic 1  MEWS Pulse 0  MEWS RR 0  MEWS LOC 0  MEWS Score 1  Post 545ml bolus.

## 2020-07-09 ENCOUNTER — Inpatient Hospital Stay (HOSPITAL_COMMUNITY)
Admit: 2020-07-09 | Discharge: 2020-07-09 | Disposition: A | Discharge status: Home / Self Care | Payer: Medicare Other | Attending: Internal Medicine | Admitting: Internal Medicine

## 2020-07-09 DIAGNOSIS — I5031 Acute diastolic (congestive) heart failure: Secondary | ICD-10-CM | POA: Diagnosis not present

## 2020-07-09 DIAGNOSIS — I517 Cardiomegaly: Secondary | ICD-10-CM | POA: Diagnosis not present

## 2020-07-09 DIAGNOSIS — R29898 Other symptoms and signs involving the musculoskeletal system: Secondary | ICD-10-CM | POA: Diagnosis not present

## 2020-07-09 DIAGNOSIS — N390 Urinary tract infection, site not specified: Secondary | ICD-10-CM | POA: Diagnosis not present

## 2020-07-09 DIAGNOSIS — A419 Sepsis, unspecified organism: Secondary | ICD-10-CM | POA: Diagnosis not present

## 2020-07-09 LAB — COMPREHENSIVE METABOLIC PANEL
ALT: 70 U/L — ABNORMAL HIGH (ref 0–44)
AST: 82 U/L — ABNORMAL HIGH (ref 15–41)
Albumin: 2.9 g/dL — ABNORMAL LOW (ref 3.5–5.0)
Alkaline Phosphatase: 108 U/L (ref 38–126)
Anion gap: 8 (ref 5–15)
BUN: 42 mg/dL — ABNORMAL HIGH (ref 8–23)
CO2: 24 mmol/L (ref 22–32)
Calcium: 7.8 mg/dL — ABNORMAL LOW (ref 8.9–10.3)
Chloride: 103 mmol/L (ref 98–111)
Creatinine, Ser: 1.27 mg/dL — ABNORMAL HIGH (ref 0.44–1.00)
GFR, Estimated: 43 mL/min — ABNORMAL LOW (ref >60)
Glucose, Bld: 85 mg/dL (ref 70–99)
Potassium: 4.8 mmol/L (ref 3.5–5.1)
Sodium: 135 mmol/L (ref 135–145)
Total Bilirubin: 0.6 mg/dL (ref 0.3–1.2)
Total Protein: 5.9 g/dL — ABNORMAL LOW (ref 6.5–8.1)

## 2020-07-09 LAB — ECHOCARDIOGRAM COMPLETE
AR max vel: 1.68 cm2
AV Area VTI: 1.62 cm2
AV Area mean vel: 1.59 cm2
AV Mean grad: 4 mmHg
AV Peak grad: 6.1 mmHg
Ao pk vel: 1.23 m/s
Area-P 1/2: 6.48 cm2
Calc EF: 48.5 %
Height: 67 in
MV VTI: 1.6 cm2
S' Lateral: 3.5 cm
Single Plane A2C EF: 52.6 %
Single Plane A4C EF: 47.9 %
Weight: 1760 oz

## 2020-07-09 LAB — CBC
HCT: 33.8 % — ABNORMAL LOW (ref 36.0–46.0)
Hemoglobin: 11.1 g/dL — ABNORMAL LOW (ref 12.0–15.0)
MCH: 31.6 pg (ref 26.0–34.0)
MCHC: 32.8 g/dL (ref 30.0–36.0)
MCV: 96.3 fL (ref 80.0–100.0)
Platelets: 124 10*3/uL — ABNORMAL LOW (ref 150–400)
RBC: 3.51 MIL/uL — ABNORMAL LOW (ref 3.87–5.11)
RDW: 12.9 % (ref 11.5–15.5)
WBC: 18.6 10*3/uL — ABNORMAL HIGH (ref 4.0–10.5)
nRBC: 0 % (ref 0.0–0.2)

## 2020-07-09 LAB — LACTIC ACID, PLASMA: Lactic Acid, Venous: 1.7 mmol/L (ref 0.5–1.9)

## 2020-07-09 LAB — PROCALCITONIN: Procalcitonin: >150 ng/mL

## 2020-07-09 LAB — BRAIN NATRIURETIC PEPTIDE: B Natriuretic Peptide: 320.3 pg/mL — ABNORMAL HIGH (ref 0.0–100.0)

## 2020-07-09 MED ORDER — ENOXAPARIN SODIUM 30 MG/0.3ML ~~LOC~~ SOLN
30.0000 mg | SUBCUTANEOUS | Status: DC
  Administered 2020-07-09: 30 mg via SUBCUTANEOUS
  Filled 2020-07-09: qty 0.3

## 2020-07-09 MED ORDER — ENSURE ENLIVE PO LIQD
237.0000 mL | Freq: Two times a day (BID) | ORAL | Status: DC
  Administered 2020-07-10 – 2020-07-15 (×7): 237 mL via ORAL

## 2020-07-09 MED ORDER — FUROSEMIDE 10 MG/ML IJ SOLN
20.0000 mg | Freq: Two times a day (BID) | INTRAMUSCULAR | Status: DC
  Administered 2020-07-09 – 2020-07-15 (×12): 20 mg via INTRAVENOUS
  Filled 2020-07-09 (×12): qty 4

## 2020-07-09 MED ORDER — PIPERACILLIN-TAZOBACTAM 3.375 G IVPB
3.3750 g | Freq: Three times a day (TID) | INTRAVENOUS | Status: AC
  Administered 2020-07-09 – 2020-07-14 (×17): 3.375 g via INTRAVENOUS
  Filled 2020-07-09 (×18): qty 50

## 2020-07-09 NOTE — Progress Notes (Signed)
PHARMACIST - PHYSICIAN COMMUNICATION  CONCERNING:  Enoxaparin (Lovenox) for DVT Prophylaxis    RECOMMENDATION: Patient was prescribed enoxaprin 40mg  q24 hours for VTE prophylaxis.   Filed Weights   07/07/20 1926  Weight: 49.9 kg (110 lb)    Body mass index is 17.23 kg/m.  Estimated Creatinine Clearance: 28.3 mL/min (A) (by C-G formula based on SCr of 1.27 mg/dL (H)). .  Patient is candidate for enoxaparin 30mg  every 24 hours based on CrCl <16ml/min   DESCRIPTION:  Pharmacy has adjusted enoxaparin dose per [facility] policy.  Patient is now receiving enoxaparin 30 mg every 24 hours    Dallie Piles, PharmD Clinical Pharmacist  07/09/2020 7:44 AM

## 2020-07-09 NOTE — TOC Initial Note (Signed)
Transition of Care [facility]) - Initial/Assessment Note    Patient Details  Name: Amanda Bruce MRN: [id number] Date of Birth: 1941-01-14  [facility]) CM/SW Contact:    Beverly Sessions, RN Phone Number: 07/09/2020, 3:45 PM  Clinical Narrative:                 Patient admitted from home with septic shock Patient lives at home alone Son lives locally, but works full time  Son at bedside PCP [place]  Baseline patient is independent PT has evaluated patient and recommends SNF.  Patient not interested in pursuing SNF, but is agreeable to home health services. Son is in agreement. CMS Medicare.gov Compare Post Acute Care list reviewed with patient and family. No preference of agency.  Referral made to [facility] with [place]  Will need RW at discharge.     Expected Discharge Plan: [place] Barriers to Discharge: Continued Medical Work up   Patient Goals and CMS Choice        Expected Discharge Plan and Services Expected Discharge Plan: [place]   Discharge Planning Services: CM Consult   Living arrangements for the past 2 months: [place] Arranged: RN,PT,OT,Nurse's Aide [place] Agency: [place] ([place]) Date HH Agency Contacted: 07/09/20   Representative spoke with at [place]: Corene Cornea  Prior Living Arrangements/Services Living arrangements for the past 2 months: [place] Lives with:: Self Patient language and need for interpreter reviewed:: Yes Do you feel safe going back to the place where you live?: Yes      Need for Family Participation in Patient Care: Yes (Comment) Care giver support system in place?: Yes (comment)   Criminal Activity/Legal Involvement Pertinent to Current Situation/Hospitalization: No - Comment as needed  Activities of Daily Living Home Assistive Devices/Equipment: None ADL Screening  (condition at time of admission) Patient's cognitive ability adequate to safely complete daily activities?: Yes Is the patient deaf or have difficulty hearing?: No Does the patient have difficulty seeing, even when wearing glasses/contacts?: No Does the patient have difficulty concentrating, remembering, or making decisions?: No Patient able to express need for assistance with ADLs?: Yes Does the patient have difficulty dressing or bathing?: No Independently performs ADLs?: Yes (appropriate for developmental age) Does the patient have difficulty walking or climbing stairs?: No Weakness of Legs: None Weakness of Arms/Hands: None  Permission Sought/Granted                  Emotional Assessment       Orientation: : Oriented to Self,Oriented to Place,Oriented to  Time,Oriented to Situation   Psych Involvement: No (comment)  Admission diagnosis:  Kidney stone [N20.0] Severe sepsis ([place]) [A41.9, R65.20] Urinary tract infection with hematuria, site unspecified [N39.0, R31.9] Patient Active Problem List   Diagnosis Date Noted  . Acute lower UTI 07/08/2020  . Right leg weakness 07/08/2020  . Ureteral obstruction, left 07/08/2020  . Cardiomegaly 07/08/2020  . Septic shock ([place]) 07/07/2020  . Cough 07/03/2020  . Memory deficit 06/14/2019  . Leg cramps, sleep related 06/12/2018  . Advanced care planning/counseling discussion 06/14/2017  . Hypercholesteremia 06/08/2016  . 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 Osteoporosis    PCP:  Valerie Roys, DO Pharmacy:   [facility]  [place], [place] [address] [place] [address] Phone: [phone] Fax: [phone]     Social Determinants of Health (SDOH) Interventions    Readmission Risk Interventions No flowsheet data found.

## 2020-07-09 NOTE — Progress Notes (Signed)
*  PRELIMINARY RESULTS* Echocardiogram 2D Echocardiogram has been performed.  Amanda Bruce M Amanda Bruce 07/09/2020, 10:09 AM 

## 2020-07-09 NOTE — Progress Notes (Signed)
   07/08/20 2018  Assess: MEWS Score  Temp 98.2 F (36.8 C)  BP (!) 93/50  Pulse Rate (!) 101  Resp 20  SpO2 98 %  O2 Device Room Air  Assess: MEWS Score  MEWS Temp 0  MEWS Systolic 1  MEWS Pulse 1  MEWS RR 0  MEWS LOC 0  MEWS Score 2  MEWS Score Color Yellow  Assess: if the MEWS score is Yellow or Red  Were vital signs taken at a resting state? Yes  Focused Assessment Change from prior assessment (see assessment flowsheet)  Early Detection of Sepsis Score *See Row Information* Low  MEWS guidelines implemented *See Row Information* No, vital signs rechecked  Treat  MEWS Interventions Other (Comment) (elevated legs and head when VS were taken later)  Document  Patient Outcome Stabilized after interventions  Patient's vs were taken 2 hours later. Patient had soft BP earlier during the day and a 500 mL bolus was given. Vitals were WDL 2 hours later when they were taken.

## 2020-07-09 NOTE — Progress Notes (Signed)
PROGRESS NOTE  KYMM SOCK [id number] DOB: [birth year] DOA: 07/07/2020 PCP: Valerie Roys, DO  HPI/Recap of past 50 hours: 80 year old female with past medical history only of kidney stones presented to the emergency room on 1/10 with several days of low back pain plus fever and nausea that had started that afternoon.  In the emergency room, work-up revealed patient was in septic shock secondary to UTI which have been caused by left-sided kidney stones that had nearly completely obstructed her left kidney.  Urology was consulted and patient was started on IV antibiotics and fluids.  Patient was taken for urgent cystoscopy with left ureteral stent placement.  Because of her sepsis, stone was not removed at that time.  Following stent placement, patient moved to hospital room.  Since that time, complaining of significant right thigh pain limiting ability to raise that leg.  No neurological findings and CK elevated mildly.    Today, patient doing better.  Lactic acidosis resolved.  White blood cell count trended upward slightly although patient has had no fever.  Procalcitonin level remains markedly elevated, greater than 150,000.  Patient states her leg is feeling better and she has a bit more mobility today.  BNP came back elevated this morning at 430, so echocardiogram has been ordered.  Assessment/Plan: Principal Problem:   Septic shock ([place]) secondary to UTI: Patient meets criteria for septic shock on admission given urinary source, lactic acidosis as well as hypotension.  Urinary tract infection caused by left ureteral obstruction from kidney stone.  Status post left ureteral stent.  Continue antibiotics and fluids.  Sepsis felt now be stabilized given normal lactic acid level.  Given procalcitonin level still markedly elevated and slight trend upward in white blood cell count, have expanded antibiotics to more broad-spectrum.  Blood pressure is also since stabilized, spending much of  1/11 hypotensive. Active Problems:   Hypercholesteremia    Right leg weakness: Unclear etiology.  Possibly rhabdomyolysis secondary to infection?  Neurological exam unremarkable and patient with mildly elevated CPK level.  With aggressive fluid resuscitation, she appears to be doing better.  AKI: Patient spent much of yesterday borderline hypotensive and into overnight.  Blood pressures have finally since improved although lab work was drawn while patient was hypotensive.  Suspect this is the cause and we should see some improvement in tomorrow's lab work.    Cardiomegaly/volume overload: Given elevated BNP, checking echocardiogram.  Patient has no history of hypertension or heart issues.   Code Status: Full code  Family Communication: Son at the bedside  Disposition Plan: Home once procalcitonin level improved, CHF ruled out, plan for stone decided on.  Patient may end up needing short-term skilled nursing   Consultants:  Urology  Procedures:  Left ureteral stent placement  Echocardiogram pending  Antimicrobials:  IV Rocephin 1/10- 1/12  IV Zosyn 1/12-present  DVT prophylaxis: Lovenox   Objective: Vitals:   07/09/20 0800 07/09/20 1115  BP: 131/81 140/88  Pulse: 81 83  Resp: 16 20  Temp: 97.6 F (36.4 C) 98.3 F (36.8 C)  SpO2: 94% 96%    Intake/Output Summary (Last 24 hours) at 07/09/2020 1320 Last data filed at 07/09/2020 0530 Gross per 24 hour  Intake 2854.99 ml  Output 800 ml  Net 2054.99 ml   Filed Weights   07/07/20 1926  Weight: 49.9 kg   Body mass index is 17.23 kg/m.  Exam:   General: Alert and oriented x3, no acute distress  HEENT: Extraocular atraumatic, mucous membranes are  moist  Cardiovascular: Regular rate and rhythm, S1-S2  Respiratory: Clear to auscultation bilaterally  Abdomen: Soft, nontender, nondistended, positive bowel sounds  Musculoskeletal: No clubbing or cyanosis or edema.  Continued right side tenderness of the  ability to raise right leg has improved from previous day although still much weaker than left side.  Skin: No skin breaks, tears or lesions  Neuro:: No focal deficits.  Downgoing toes bilaterally.  No loss of sensation in either leg.  Psychiatry: Patient is appropriate, no evidence of psychoses   Data Reviewed: CBC: Recent Labs  Lab 07/07/20 1933 07/08/20 0544 07/09/20 0356  WBC 2.6* 14.1* 18.6*  HGB 13.6 11.0* 11.1*  HCT 42.8 33.7* 33.8*  MCV 99.1 96.3 96.3  PLT 200 147* 509*   Basic Metabolic Panel: Recent Labs  Lab 07/07/20 1933 07/08/20 0544 07/09/20 0356  NA 145 137 135  K 3.6 3.1* 4.8  CL 105 100 103  CO2 27 24 24   GLUCOSE 131* 94 85  BUN 30* 32* 42*  CREATININE 0.92 0.97 1.27*  CALCIUM 9.2 8.1* 7.8*   GFR: Estimated Creatinine Clearance: 28.3 mL/min (A) (by C-G formula based on SCr of 1.27 mg/dL (H)). Liver Function Tests: Recent Labs  Lab 07/07/20 1933 07/09/20 0356  AST 159* 82*  ALT 62* 70*  ALKPHOS 134* 108  BILITOT 1.2 0.6  PROT 7.3 5.9*  ALBUMIN 4.0 2.9*   Recent Labs  Lab 07/07/20 1933  LIPASE 25   No results for input(s): AMMONIA in the last 168 hours. Coagulation Profile: Recent Labs  Lab 07/08/20 0544  INR 1.1   Cardiac Enzymes: Recent Labs  Lab 07/08/20 1459  CKTOTAL 342*   BNP (last 3 results) No results for input(s): PROBNP in the last 8760 hours. HbA1C: No results for input(s): HGBA1C in the last 72 hours. CBG: No results for input(s): GLUCAP in the last 168 hours. Lipid Profile: No results for input(s): CHOL, HDL, LDLCALC, TRIG, CHOLHDL, LDLDIRECT in the last 72 hours. Thyroid Function Tests: No results for input(s): TSH, T4TOTAL, FREET4, T3FREE, THYROIDAB in the last 72 hours. Anemia Panel: No results for input(s): VITAMINB12, FOLATE, FERRITIN, TIBC, IRON, RETICCTPCT in the last 72 hours. Urine analysis:    Component Value Date/Time   COLORURINE YELLOW (A) 07/07/2020 1933   APPEARANCEUR CLOUDY (A) 07/07/2020  1933   APPEARANCEUR Clear 06/14/2019 0947   LABSPEC 1.011 07/07/2020 1933   PHURINE 5.0 07/07/2020 1933   GLUCOSEU NEGATIVE 07/07/2020 1933   HGBUR MODERATE (A) 07/07/2020 1933   BILIRUBINUR NEGATIVE 07/07/2020 1933   BILIRUBINUR Negative 06/14/2019 [place] 07/07/2020 1933   PROTEINUR 30 (A) 07/07/2020 1933   NITRITE POSITIVE (A) 07/07/2020 1933   LEUKOCYTESUR TRACE (A) 07/07/2020 1933   Sepsis Labs: @LABRCNTIP (procalcitonin:4,lacticidven:4)  ) Recent Results (from the past 240 hour(s))  Novel Coronavirus, NAA ([facility])     Status: None   Collection Time: 07/03/20  9:01 AM   Specimen: Nasopharyngeal(NP) swabs in vial transport medium  Result Value Ref Range Status   SARS-CoV-2, NAA Not Detected Not Detected Final    Comment: This nucleic acid amplification test was developed and its performance characteristics determined by [employer]. Nucleic acid amplification tests include RT-PCR and TMA. This test has not been FDA cleared or approved. This test has been authorized by FDA under an Emergency Use Authorization (EUA). This test is only authorized for the duration of time the declaration that circumstances exist justifying the authorization of the emergency use of in vitro diagnostic tests  for detection of SARS-CoV-2 virus and/or diagnosis of COVID-19 infection under section 564(b)(1) of the Act, 21 U.S.C. 347QQV-9(D) (1), unless the authorization is terminated or revoked sooner. When diagnostic testing is negative, the possibility of a false negative result should be considered in the context of a patient's recent exposures and the presence of clinical signs and symptoms consistent with COVID-19. An individual without symptoms of COVID-19 and who is not shedding SARS-CoV-2 virus wo uld expect to have a negative (not detected) result in this assay.   SARS-COV-2, NAA 2 DAY TAT     Status: None   Collection Time: 07/03/20  9:01 AM  Result Value Ref  Range Status   SARS-CoV-2, NAA 2 DAY TAT Performed  Final  Urine culture     Status: Abnormal (Preliminary result)   Collection Time: 07/07/20  7:33 PM   Specimen: Urine, Random  Result Value Ref Range Status   Specimen Description   Final    URINE, RANDOM Performed at [facility], [address] [place], [place] [address]    Special Requests   Final    NONE Performed at [facility], [address] [place], [place] [address]    Culture (A)  Final    >=100,000 COLONIES/mL ESCHERICHIA COLI SUSCEPTIBILITIES TO FOLLOW Performed at [facility], [place] [address], [place], [place] [address]    Report Status PENDING  Incomplete  Resp Panel by RT-PCR (Flu A&B, Covid) Nasopharyngeal Swab     Status: None   Collection Time: 07/07/20 10:56 PM   Specimen: Nasopharyngeal Swab; Nasopharyngeal(NP) swabs in vial transport medium  Result Value Ref Range Status   SARS Coronavirus 2 by RT PCR NEGATIVE NEGATIVE Final    Comment: (NOTE) SARS-CoV-2 target nucleic acids are NOT DETECTED.  The SARS-CoV-2 RNA is generally detectable in upper respiratory specimens during the acute phase of infection. The lowest concentration of SARS-CoV-2 viral copies this assay can detect is 138 copies/mL. A negative result does not preclude SARS-Cov-2 infection and should not be used as the sole basis for treatment or other patient management decisions. A negative result may occur with  improper specimen collection/handling, submission of specimen other than nasopharyngeal swab, presence of viral mutation(s) within the areas targeted by this assay, and inadequate number of viral copies(<138 copies/mL). A negative result must be combined with clinical observations, patient history, and epidemiological information. The expected result is Negative.  Fact Sheet for Patients:  EntrepreneurPulse.com.au  Fact Sheet for Healthcare Providers:   IncredibleEmployment.be  This test is no t yet approved or cleared by the [place] FDA and  has been authorized for detection and/or diagnosis of SARS-CoV-2 by FDA under an Emergency Use Authorization (EUA). This EUA will remain  in effect (meaning this test can be used) for the duration of the COVID-19 declaration under Section 564(b)(1) of the Act, 21 U.S.C.section 360bbb-3(b)(1), unless the authorization is terminated  or revoked sooner.       Influenza A by PCR NEGATIVE NEGATIVE Final   Influenza B by PCR NEGATIVE NEGATIVE Final    Comment: (NOTE) The Xpert Xpress SARS-CoV-2/FLU/RSV plus assay is intended as an aid in the diagnosis of influenza from Nasopharyngeal swab specimens and should not be used as a sole basis for treatment. Nasal washings and aspirates are unacceptable for Xpert Xpress SARS-CoV-2/FLU/RSV testing.  Fact Sheet for Patients: EntrepreneurPulse.com.au  Fact Sheet for Healthcare Providers: IncredibleEmployment.be  This test is not yet approved or cleared by the [place] FDA and has  been authorized for detection and/or diagnosis of SARS-CoV-2 by FDA under an Emergency Use Authorization (EUA). This EUA will remain in effect (meaning this test can be used) for the duration of the COVID-19 declaration under Section 564(b)(1) of the Act, 21 U.S.C. section 360bbb-3(b)(1), unless the authorization is terminated or revoked.  Performed at [facility], [address], [place], [place] [address]   Urine Culture     Status: Abnormal (Preliminary result)   Collection Time: 07/08/20 12:59 AM   Specimen: PATH Other; Urine  Result Value Ref Range Status   Specimen Description   Final    URINE, RANDOM Performed at [facility], [address] [place], [place] [address]    Special Requests   Final    LEFT RENAL PELVIS Performed at [facility], [address]  [place], [place] [address]    Culture (A)  Final    >=100,000 COLONIES/mL ESCHERICHIA COLI SUSCEPTIBILITIES TO FOLLOW Performed at [facility], [place] [address], [place], [place] [address]    Report Status PENDING  Incomplete      Studies: No results found.  Scheduled Meds: . ascorbic acid  500 mg Oral Daily  . Chlorhexidine Gluconate Cloth  6 each Topical Daily  . enoxaparin (LOVENOX) injection  30 mg Subcutaneous Q24H    Continuous Infusions: . lactated ringers 125 mL/hr at 07/09/20 1202  . piperacillin-tazobactam (ZOSYN)  IV 3.375 g (07/09/20 1204)     LOS: 2 days     Annita Brod, MD [facility]   07/09/2020, 1:20 PM

## 2020-07-09 NOTE — Progress Notes (Signed)
Patient still complaining of 7 out of 10 pain in her right anterior and lateral thigh. 15mg  of Toradol given. I also encouraged patient to move leg around and do ankle pumps to prevent stiffness. Will continue to monitor. 4:36 AM

## 2020-07-09 NOTE — Progress Notes (Addendum)
Pharmacy Antibiotic Note  Amanda Bruce is a 80 y.o. female admitted on 07/07/2020 with obstructive uropathy. Pharmacy has been consulted for Zosyn dosing. Patient is post-op day 1 following urgent cystoscopy w/ 1 left ureteral stent placement. Pt's WBC's have been increasing 2.6>>18.6, and her Scr 0.92>>1.27.    Plan: Start Zosyn 3.375gm IV Q8H extended infusion  Height: 5\' 7"  (170.2 cm) Weight: 49.9 kg (110 lb) IBW/kg (Calculated) : 61.6  Temp (24hrs), Avg:98.3 F (36.8 C), Min:97.6 F (36.4 C), Max:99.1 F (37.3 C)  Recent Labs  Lab 07/07/20 1933 07/07/20 2137 07/08/20 0544 07/08/20 1459 07/09/20 0356  WBC 2.6*  --  14.1*  --  18.6*  CREATININE 0.92  --  0.97  --  1.27*  LATICACIDVEN 4.3* 4.5*  --  2.5* 1.7    Estimated Creatinine Clearance: 28.3 mL/min (A) (by C-G formula based on SCr of 1.27 mg/dL (H)).    No Known Allergies  Antimicrobials this admission: 1/10 CTX >> 1/11 1/12 Zosyn>>   Microbiology results: 1/10 UCx: Gram-negative rods E. Coli >100,000 colonies 1/11 UCx: Gram-negative rods E. Coli >100,000 colonies 1/10 SARS CoV-2: negative 1/10influenza A/B: negative     Thank you for allowing pharmacy to be a part of this patient's care.  Amanda Bruce [facility] 07/09/2020 1:25 PM

## 2020-07-10 ENCOUNTER — Inpatient Hospital Stay: Payer: Medicare Other

## 2020-07-10 DIAGNOSIS — M79651 Pain in right thigh: Secondary | ICD-10-CM

## 2020-07-10 DIAGNOSIS — I5032 Chronic diastolic (congestive) heart failure: Secondary | ICD-10-CM | POA: Insufficient documentation

## 2020-07-10 DIAGNOSIS — I5031 Acute diastolic (congestive) heart failure: Secondary | ICD-10-CM | POA: Diagnosis not present

## 2020-07-10 DIAGNOSIS — A419 Sepsis, unspecified organism: Secondary | ICD-10-CM | POA: Diagnosis not present

## 2020-07-10 DIAGNOSIS — E43 Unspecified severe protein-calorie malnutrition: Secondary | ICD-10-CM | POA: Insufficient documentation

## 2020-07-10 DIAGNOSIS — N390 Urinary tract infection, site not specified: Secondary | ICD-10-CM | POA: Diagnosis not present

## 2020-07-10 LAB — CBC
HCT: 31.6 % — ABNORMAL LOW (ref 36.0–46.0)
Hemoglobin: 10.6 g/dL — ABNORMAL LOW (ref 12.0–15.0)
MCH: 31.4 pg (ref 26.0–34.0)
MCHC: 33.5 g/dL (ref 30.0–36.0)
MCV: 93.5 fL (ref 80.0–100.0)
Platelets: 142 10*3/uL — ABNORMAL LOW (ref 150–400)
RBC: 3.38 MIL/uL — ABNORMAL LOW (ref 3.87–5.11)
RDW: 12.5 % (ref 11.5–15.5)
WBC: 20.8 10*3/uL — ABNORMAL HIGH (ref 4.0–10.5)
nRBC: 0 % (ref 0.0–0.2)

## 2020-07-10 LAB — BASIC METABOLIC PANEL
Anion gap: 9 (ref 5–15)
BUN: 26 mg/dL — ABNORMAL HIGH (ref 8–23)
CO2: 25 mmol/L (ref 22–32)
Calcium: 8.3 mg/dL — ABNORMAL LOW (ref 8.9–10.3)
Chloride: 102 mmol/L (ref 98–111)
Creatinine, Ser: 0.69 mg/dL (ref 0.44–1.00)
GFR, Estimated: >60 mL/min (ref >60)
Glucose, Bld: 100 mg/dL — ABNORMAL HIGH (ref 70–99)
Potassium: 3 mmol/L — ABNORMAL LOW (ref 3.5–5.1)
Sodium: 136 mmol/L (ref 135–145)

## 2020-07-10 LAB — URINE CULTURE
Culture: >=100000 — AB
Culture: >=100000 — AB

## 2020-07-10 LAB — CK: Total CK: 127 U/L (ref 38–234)

## 2020-07-10 LAB — PROCALCITONIN: Procalcitonin: 53.65 ng/mL

## 2020-07-10 MED ORDER — POTASSIUM CHLORIDE CRYS ER 20 MEQ PO TBCR
40.0000 meq | EXTENDED_RELEASE_TABLET | Freq: Once | ORAL | Status: AC
  Administered 2020-07-10: 40 meq via ORAL
  Filled 2020-07-10: qty 2

## 2020-07-10 MED ORDER — POTASSIUM CHLORIDE CRYS ER 20 MEQ PO TBCR
40.0000 meq | EXTENDED_RELEASE_TABLET | Freq: Once | ORAL | Status: AC
  Administered 2020-07-10: 40 meq via ORAL

## 2020-07-10 MED ORDER — ENOXAPARIN SODIUM 40 MG/0.4ML ~~LOC~~ SOLN
40.0000 mg | SUBCUTANEOUS | Status: DC
  Administered 2020-07-10 – 2020-07-14 (×5): 40 mg via SUBCUTANEOUS
  Filled 2020-07-10 (×5): qty 0.4

## 2020-07-10 MED ORDER — POLYETHYLENE GLYCOL 3350 17 G PO PACK
17.0000 g | PACK | Freq: Every day | ORAL | Status: DC
  Administered 2020-07-10 – 2020-07-15 (×4): 17 g via ORAL
  Filled 2020-07-10 (×6): qty 1

## 2020-07-10 NOTE — Progress Notes (Signed)
Mobility Specialist - Progress Note   07/10/20 1500  Mobility  Activity Stood at bedside  Range of Motion/Exercises Right leg;Left leg (AP, QS, SLR, HS, ISO)  Level of Assistance Minimal assist, patient does 75% or more  Assistive Device Front wheel walker  Distance Ambulated (ft) 0 ft  Mobility Response Tolerated well  Mobility performed by Mobility specialist  $Mobility charge 1 Mobility    Pre-mobility: 81 HR, 92% SpO2 During mobility: 104 HR  Post-mobility: 85 HR, 91% SpO2   Pt was lying in bed with friend present in room utilizing room air. Pt agreed to session. Pt denied pain, nausea, or fatigue initially. Pt was able to perform supine exercises: ankle pumps x10, quad sets x10, straight leg raises x10, and hip isometrics x10. Pt c/o pain while performing SLR on RLE, but is able to lift extremity ~30' before complaint. Prior to getting EOB, pt was able to don/adjust socks using heel slide method on both legs. Pt was able to sit EOB with minA. No dizziness upon sitting. Pt stood to RW x1 progressing to 20-second march. Pt voiced that pain decreases with standing/OOB activity. Overall, pt tolerated session well. Pt was left in bed with all needs in reach and alarm set.    Kathee Delton Mobility Specialist 07/10/20, 4:03 PM

## 2020-07-10 NOTE — Progress Notes (Signed)
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    GOAL:   Patient will meet greater than or equal to 90% of their needs    MONITOR:   PO intake,Supplement acceptance,Weight trends,Labs,I & O's  REASON FOR ASSESSMENT:   Consult    ASSESSMENT:  80 year old female admitted with septic shock due to UTI related to severe left hydronephrosis presented with acute onset of low back pain with associated nausea without vomiting. Past medical history of nephrolithiasis and osteoporosis.  Pt is s/p left ureteral stent placement on 1/10  Pt awake, friend at bedside this afternoon. Pt reports doing okay, endorses right inner thigh pain when attempting to get up. She reports eating a little bit of fruit this morning. Observed strawberry Ensure on bedside table, pt reports drinking about half, says it is a little too sweet. Patient reports good appetite at home, says she eats one meal/day (chicken, broccoli, rice, [employer] fried chicken sandwiches) She really likes her clothes and does not want to gain weight. Friend at bedside shares that pt will not eat after 4 pm and baseline intake is minimal. Patient reports she likes to line dance, goes 2-3 times per week. RD educated  on nutritional status as it relates to the body's ability to recover from acute illness, discussed moderate/severe fat and muscle wasting findings on exam and stressed the importance of adequate nutrition to prevent further muscle wasting. RD encouraged small frequent meals/snacks throughout the day, educated on good sources of lean proteins, and encouraged daily nutrition supplement. She is agreeable to trying CIB on breakfast tray as well as Magic Cup with lunch and dinner. RD provided vanilla flavor Ensure with a cup of ice form nourishment room for pt to try, which she reports liking better.  UBW: 118-123 lbs per pt No recent past weight history for for review. Pt is ~9 lbs (7.6%) under usual weight which is significant.   No recent wt history for review.  Medications reviewed and include: Vit C, Laxix IV 20 mg twice daily, Zosyn  Labs: K 3.0 (L), BUN 26 (H), WBC 20.8 (H), Hgb 10.6 (L), HCT 31.6 (L)   NUTRITION - FOCUSED PHYSICAL EXAM:  Flowsheet Row Most Recent Value  Orbital Region Severe depletion  Upper Arm Region Moderate depletion  Thoracic and Lumbar Region Severe depletion  Buccal Region Moderate depletion  Temple Region Severe depletion  Clavicle Bone Region Severe depletion  Clavicle and Acromion Bone Region Severe depletion  Scapular Bone Region Moderate depletion  Dorsal Hand Severe depletion  Patellar Region Severe depletion  Anterior Thigh Region Moderate depletion  Posterior Calf Region Moderate depletion  Edema (RD Assessment) None  Hair Reviewed  Eyes Reviewed  Mouth Reviewed  Skin Reviewed  Nails Reviewed       Diet Order:   Diet Order  Diet regular Room service appropriate? Yes; Fluid consistency: Thin  Diet effective now                 EDUCATION NEEDS:   Education needs have been addressed  Skin:  Skin Assessment: Reviewed RN Assessment  Last BM:  1/10  Height:   Ht Readings from Last 1 Encounters:  07/07/20 5\' 7"  (1.702 m)     Weight:   Wt Readings from Last 1 Encounters:  07/10/20 49.7 kg    BMI:  Body mass index is 17.16 kg/m.  Estimated Nutritional Needs:   Kcal:  1600-1800  Protein:  75-85  Fluid:  >1.5 L    Amanda Bruce, RD, LDN Clinical Nutrition After Hours/Weekend Pager # in [place]

## 2020-07-10 NOTE — Plan of Care (Signed)

## 2020-07-10 NOTE — Care Management Important Message (Signed)
Important Message  Patient Details  Name: Amanda Bruce MRN: [id number] Date of Birth: 08-Jul-1940   Medicare Important Message Given:  Yes     [name] 07/10/2020, 12:54 PM

## 2020-07-10 NOTE — Progress Notes (Signed)
Physical Therapy Treatment Patient Details Name: Amanda Bruce MRN: [id number] DOB: 1941-03-27 Today's Date: 07/10/2020    History of Present Illness Per MD notes: Pt is a 80 year old female with past medical history only of kidney stones presented to the emergency room on 1/10 with several days of low back pain plus fever and nausea that had started that afternoon.  In the emergency room, work-up revealed patient was in septic shock secondary to UTI which have been caused by left-sided kidney stones that had nearly completely obstructed her left kidney.  Urology was consulted and patient was started on IV antibiotics and fluids.  Patient was taken for urgent cystoscopy with left ureteral stent placement.  Because of her sepsis, stone was not removed.  Assessment also includes RLE weakness of unclear etiology and cardiomegaly.  PMH includes osteoporosis.    PT Comments    Pt was long sitting in bed with supportive friend at bedside. She is very pleasant and agreeable to OOB activity. Is A and O x 4. Endorses R thigh pain 7/10 that resolves with standing/wt bearing activity. Pt easily able to ambulate around with use of RW. Discussed recommendation of rehab however pt feels she can safely manage at home at DC with 24 hour assistance from friends. Would benefit from SNF for frequency of PT. Acute PT will continue to follow and progress as able per POC.    Follow Up Recommendations  SNF;Supervision for mobility/OOB;Supervision/Assistance - 24 hour;Supervision - Intermittent;Other (comment) (pt does not plan to go to rehab and does report she has friends to assist her at home. If pt does DC home, recommend [facility] services.)     Equipment Recommendations  Other (comment) (reports having RW and 3 in 1 at home already)    Recommendations for Other Services       Precautions / Restrictions Precautions Precautions: Fall Restrictions Weight Bearing Restrictions: No    Mobility  Bed  Mobility Overal bed mobility: Needs Assistance Bed Mobility: Supine to Sit     Supine to sit: Min assist        Transfers Overall transfer level: Needs assistance Equipment used: Rolling walker (2 wheeled) Transfers: Sit to/from Stand Sit to Stand: Supervision         General transfer comment: pt was able to stand from EOB to RW without physical assistance or cues  Ambulation/Gait Ambulation/Gait assistance: Supervision Gait Distance (Feet): 70 Feet Assistive device: Rolling walker (2 wheeled) Gait Pattern/deviations: Step-through pattern;Decreased step length - right;Decreased step length - left Gait velocity: decreased   General Gait Details: pt was able to ambulate around room with RW with supervision       Balance Overall balance assessment: Needs assistance Sitting-balance support: Bilateral upper extremity supported Sitting balance-Leahy Scale: Good     Standing balance support: Bilateral upper extremity supported;During functional activity Standing balance-Leahy Scale: Good        Cognition Arousal/Alertness: Awake/alert Behavior During Therapy: WFL for tasks assessed/performed Overall Cognitive Status: Within Functional Limits for tasks assessed        General Comments: Pt was A and O x 4. cooperative and pleasant. has supportive friend in room who states she will be assisting pt at home at DC.             Pertinent Vitals/Pain Pain Assessment: 0-10 Pain Score: 7  Pain Location: R hip Pain Intervention(s): Limited activity within patient's tolerance;Monitored during session;Premedicated before session;Repositioned           PT Goals (current goals  can now be found in the care plan section) Acute Rehab PT Goals Patient Stated Goal: To get stronger and move RLE without pain Progress towards PT goals: Progressing toward goals    Frequency    Min 2X/week      PT Plan Current plan remains appropriate       AM-PAC PT "6 Clicks"  Mobility   Outcome Measure  Help needed turning from your back to your side while in a flat bed without using bedrails?: A Little Help needed moving from lying on your back to sitting on the side of a flat bed without using bedrails?: A Little Help needed moving to and from a bed to a chair (including a wheelchair)?: A Little Help needed standing up from a chair using your arms (e.g., wheelchair or bedside chair)?: A Little Help needed to walk in hospital room?: A Little Help needed climbing 3-5 steps with a railing? : A Little 6 Click Score: 18    End of Session Equipment Utilized During Treatment: Gait belt Activity Tolerance: Patient tolerated treatment well Patient left: in chair;with call bell/phone within reach;with chair alarm set;with family/visitor present;with nursing/sitter in room Nurse Communication: Mobility status PT Visit Diagnosis: Difficulty in walking, not elsewhere classified (R26.2);Muscle weakness (generalized) (M62.81);Pain Pain - Right/Left: Right Pain - part of body: Hip     Time: 0940-1009 PT Time Calculation (min) (ACUTE ONLY): 29 min  Charges:  $Gait Training: 8-22 mins $Therapeutic Activity: 8-22 mins                     Julaine Fusi PTA 07/10/20, 10:37 AM

## 2020-07-10 NOTE — Progress Notes (Signed)
PROGRESS NOTE  Amanda Bruce [id number] DOB: 25-Jan-1941 DOA: 07/07/2020 PCP: Valerie Roys, DO  HPI/Recap of past 67 hours: 80 year old female with past medical history only of kidney stones presented to the emergency room on 1/10 with several days of low back pain plus fever and nausea that had started that afternoon.  In the emergency room, work-up revealed patient was in septic shock secondary to UTI which have been caused by left-sided kidney stones that had nearly completely obstructed her left kidney.  Urology was consulted and patient was started on IV antibiotics and fluids.  Patient was taken for urgent cystoscopy with left ureteral stent placement.  Because of her sepsis, stone was not removed at that time.  Following stent placement, patient moved to hospital room.  Since that time, complaining of significant right thigh pain limiting ability to raise that leg.  No neurological findings and CK elevated mildly.  Sepsis since resolved.  BNP elevated at 430's echocardiogram done noting mild diastolic dysfunction.  Started on Lasix.  Antibiotics changed to Zosyn.  Patient doing much better today.  Procalcitonin finally down from greater than 1 50-53.  Patient has diuresed over 5 L.  Still complaining of some right leg pain.  Assessment/Plan: Principal Problem: E. coli septic shock ([place]) secondary to UTI: Patient meets criteria for septic shock on admission given urinary source, lactic acidosis as well as hypotension.  Urinary tract infection caused by left ureteral obstruction from kidney stone.  Status post left ureteral stent.  Continue antibiotics.  Given much improved response to Zosyn, would favor keeping Zosyn on board for now until procalcitonin much more manageable.  Sepsis felt now be stabilized given normal lactic acid level and stabilization of blood pressure.  Following up with urology as far as timing of stent removal and if stone should be removed/dissolved Active  Problems:   Hypercholesteremia    Right leg weakness/pain: Unclear etiology.  Slim possibility of rhabdomyolysis secondary to infection?  Neurological exam unremarkable and patient with mildly elevated CPK level.  CPK level down, but pain persisting.  Checking lower extremity Doppler to rule out DVT  AKI: Mild elevation on 1/12 only, likely due to hypertension but since resolved with IV fluids.  Creatinine normal today.  Continue to monitor while on Lasix.    Acute diastolic heart failure: Echocardiogram notes grade 1 diastolic dysfunction.  Responding very well to IV Lasix.  Continue to follow.  Leukocytosis: Elevated despite clear improvement in infection.  I be stress margination, possibly from volume overload versus?  DVT  Protein calorie malnutrition: Patient underweight with BMI of 17 and likely weighs less than this as some of this weight is from fluid.  Nutrition consult.  In the meantime have started Ensure.   Code Status: Full code  Family Communication: Updated son by phone.  Disposition Plan: Home once procalcitonin level improved, diuresed, plan for stone decided, source of leg pain reviewed.   Consultants:  Urology  Procedures:  Left ureteral stent placement 1/10  Echocardiogram 0000000: Grade 1 diastolic dysfunction  Right lower extremity Doppler pending  Antimicrobials:  IV Rocephin 1/10- 1/12  IV Zosyn 1/12-present  DVT prophylaxis: Lovenox   Objective: Vitals:   07/10/20 0507 07/10/20 0900  BP: 137/85 140/87  Pulse: 86 70  Resp: 20 20  Temp: 98.3 F (36.8 C) 98.3 F (36.8 C)  SpO2: 94% 94%    Intake/Output Summary (Last 24 hours) at 07/10/2020 0957 Last data filed at 07/10/2020 0523 Gross per 24 hour  Intake 2607.6 ml  Output 4100 ml  Net -1492.4 ml   Filed Weights   07/07/20 1926 07/10/20 0507  Weight: 49.9 kg 49.7 kg   Body mass index is 17.16 kg/m.  Exam:   General: Alert and oriented x3, no acute distress, thin  HEENT:  Normocephalic, atraumatic, mucous membranes are moist  Cardiovascular: Regular rate and rhythm, S1-S2  Respiratory: Clear to auscultation bilaterally  Abdomen: Soft, nontender, nondistended, positive bowel sounds  Musculoskeletal: No clubbing or cyanosis or edema.  Continued right side tenderness of right leg, limiting ability to straight raise  Skin: No skin breaks, tears or lesions  Neuro:: No focal deficits.    Psychiatry: Patient is appropriate, no evidence of psychoses   Data Reviewed: CBC: Recent Labs  Lab 07/07/20 1933 07/08/20 0544 07/09/20 0356 07/10/20 0310  WBC 2.6* 14.1* 18.6* 20.8*  HGB 13.6 11.0* 11.1* 10.6*  HCT 42.8 33.7* 33.8* 31.6*  MCV 99.1 96.3 96.3 93.5  PLT 200 147* 124* 409*   Basic Metabolic Panel: Recent Labs  Lab 07/07/20 1933 07/08/20 0544 07/09/20 0356 07/10/20 0310  NA 145 137 135 136  K 3.6 3.1* 4.8 3.0*  CL 105 100 103 102  CO2 27 24 24 25   GLUCOSE 131* 94 85 100*  BUN 30* 32* 42* 26*  CREATININE 0.92 0.97 1.27* 0.69  CALCIUM 9.2 8.1* 7.8* 8.3*   GFR: Estimated Creatinine Clearance: 44.7 mL/min (by C-G formula based on SCr of 0.69 mg/dL). Liver Function Tests: Recent Labs  Lab 07/07/20 1933 07/09/20 0356  AST 159* 82*  ALT 62* 70*  ALKPHOS 134* 108  BILITOT 1.2 0.6  PROT 7.3 5.9*  ALBUMIN 4.0 2.9*   Recent Labs  Lab 07/07/20 1933  LIPASE 25   No results for input(s): AMMONIA in the last 168 hours. Coagulation Profile: Recent Labs  Lab 07/08/20 0544  INR 1.1   Cardiac Enzymes: Recent Labs  Lab 07/08/20 1459 07/10/20 0310  CKTOTAL 342* 127   BNP (last 3 results) No results for input(s): PROBNP in the last 8760 hours. HbA1C: No results for input(s): HGBA1C in the last 72 hours. CBG: No results for input(s): GLUCAP in the last 168 hours. Lipid Profile: No results for input(s): CHOL, HDL, LDLCALC, TRIG, CHOLHDL, LDLDIRECT in the last 72 hours. Thyroid Function Tests: No results for input(s): TSH,  T4TOTAL, FREET4, T3FREE, THYROIDAB in the last 72 hours. Anemia Panel: No results for input(s): VITAMINB12, FOLATE, FERRITIN, TIBC, IRON, RETICCTPCT in the last 72 hours. Urine analysis:    Component Value Date/Time   COLORURINE YELLOW (A) 07/07/2020 1933   APPEARANCEUR CLOUDY (A) 07/07/2020 1933   APPEARANCEUR Clear 06/14/2019 0947   LABSPEC 1.011 07/07/2020 1933   PHURINE 5.0 07/07/2020 1933   GLUCOSEU NEGATIVE 07/07/2020 1933   HGBUR MODERATE (A) 07/07/2020 1933   BILIRUBINUR NEGATIVE 07/07/2020 1933   BILIRUBINUR Negative 06/14/2019 [place] 07/07/2020 1933   PROTEINUR 30 (A) 07/07/2020 1933   NITRITE POSITIVE (A) 07/07/2020 1933   LEUKOCYTESUR TRACE (A) 07/07/2020 1933   Sepsis Labs: @LABRCNTIP (procalcitonin:4,lacticidven:4)  ) Recent Results (from the past 240 hour(s))  Novel Coronavirus, NAA ([facility])     Status: None   Collection Time: 07/03/20  9:01 AM   Specimen: Nasopharyngeal(NP) swabs in vial transport medium  Result Value Ref Range Status   SARS-CoV-2, NAA Not Detected Not Detected Final    Comment: This nucleic acid amplification test was developed and its performance characteristics determined by [employer]. Nucleic acid amplification tests include  RT-PCR and TMA. This test has not been FDA cleared or approved. This test has been authorized by FDA under an Emergency Use Authorization (EUA). This test is only authorized for the duration of time the declaration that circumstances exist justifying the authorization of the emergency use of in vitro diagnostic tests for detection of SARS-CoV-2 virus and/or diagnosis of COVID-19 infection under section 564(b)(1) of the Act, 21 U.S.C. [id number]) (1), unless the authorization is terminated or revoked sooner. When diagnostic testing is negative, the possibility of a false negative result should be considered in the context of a patient's recent exposures and the presence of clinical signs  and symptoms consistent with COVID-19. An individual without symptoms of COVID-19 and who is not shedding SARS-CoV-2 virus wo uld expect to have a negative (not detected) result in this assay.   SARS-COV-2, NAA 2 DAY TAT     Status: None   Collection Time: 07/03/20  9:01 AM  Result Value Ref Range Status   SARS-CoV-2, NAA 2 DAY TAT Performed  Final  Urine culture     Status: Abnormal   Collection Time: 07/07/20  7:33 PM   Specimen: Urine, Random  Result Value Ref Range Status   Specimen Description   Final    URINE, RANDOM Performed at [facility], [address] [place], [place] [address]    Special Requests   Final    NONE Performed at [facility], [address] [place], [place] [address]    Culture >=100,000 COLONIES/mL ESCHERICHIA COLI (A)  Final   Report Status 07/10/2020 FINAL  Final   Organism ID, Bacteria ESCHERICHIA COLI (A)  Final      Susceptibility   Escherichia coli - MIC*    AMPICILLIN >=32 RESISTANT Resistant     CEFAZOLIN <=4 SENSITIVE Sensitive     CEFEPIME <=0.12 SENSITIVE Sensitive     CEFTRIAXONE <=0.25 SENSITIVE Sensitive     CIPROFLOXACIN >=4 RESISTANT Resistant     GENTAMICIN <=1 SENSITIVE Sensitive     IMIPENEM <=0.25 SENSITIVE Sensitive     NITROFURANTOIN <=16 SENSITIVE Sensitive     TRIMETH/SULFA <=20 SENSITIVE Sensitive     AMPICILLIN/SULBACTAM >=32 RESISTANT Resistant     PIP/TAZO <=4 SENSITIVE Sensitive     * >=100,000 COLONIES/mL ESCHERICHIA COLI  Resp Panel by RT-PCR (Flu A&B, Covid) Nasopharyngeal Swab     Status: None   Collection Time: 07/07/20 10:56 PM   Specimen: Nasopharyngeal Swab; Nasopharyngeal(NP) swabs in vial transport medium  Result Value Ref Range Status   SARS Coronavirus 2 by RT PCR NEGATIVE NEGATIVE Final    Comment: (NOTE) SARS-CoV-2 target nucleic acids are NOT DETECTED.  The SARS-CoV-2 RNA is generally detectable in upper respiratory specimens during the acute phase of infection. The  lowest concentration of SARS-CoV-2 viral copies this assay can detect is 138 copies/mL. A negative result does not preclude SARS-Cov-2 infection and should not be used as the sole basis for treatment or other patient management decisions. A negative result may occur with  improper specimen collection/handling, submission of specimen other than nasopharyngeal swab, presence of viral mutation(s) within the areas targeted by this assay, and inadequate number of viral copies(<138 copies/mL). A negative result must be combined with clinical observations, patient history, and epidemiological information. The expected result is Negative.  Fact Sheet for Patients:  EntrepreneurPulse.com.au  Fact Sheet for Healthcare Providers:  IncredibleEmployment.be  This test is no t yet approved or cleared by the [place] and  has been authorized for  detection and/or diagnosis of SARS-CoV-2 by FDA under an Emergency Use Authorization (EUA). This EUA will remain  in effect (meaning this test can be used) for the duration of the COVID-19 declaration under Section 564(b)(1) of the Act, 21 U.S.C.section 360bbb-3(b)(1), unless the authorization is terminated  or revoked sooner.       Influenza A by PCR NEGATIVE NEGATIVE Final   Influenza B by PCR NEGATIVE NEGATIVE Final    Comment: (NOTE) The Xpert Xpress SARS-CoV-2/FLU/RSV plus assay is intended as an aid in the diagnosis of influenza from Nasopharyngeal swab specimens and should not be used as a sole basis for treatment. Nasal washings and aspirates are unacceptable for Xpert Xpress SARS-CoV-2/FLU/RSV testing.  Fact Sheet for Patients: EntrepreneurPulse.com.au  Fact Sheet for Healthcare Providers: IncredibleEmployment.be  This test is not yet approved or cleared by the [place] FDA and has been authorized for detection and/or diagnosis of SARS-CoV-2 by FDA under  an Emergency Use Authorization (EUA). This EUA will remain in effect (meaning this test can be used) for the duration of the COVID-19 declaration under Section 564(b)(1) of the Act, 21 U.S.C. section 360bbb-3(b)(1), unless the authorization is terminated or revoked.  Performed at [facility], [address] [place], [place] [address]   Urine Culture     Status: Abnormal   Collection Time: 07/08/20 12:59 AM   Specimen: PATH Other; Urine  Result Value Ref Range Status   Specimen Description   Final    URINE, RANDOM Performed at [facility], [address] [place], [place] [address]    Special Requests   Final    LEFT RENAL PELVIS Performed at [facility], [address] [place], [place] [address]    Culture >=100,000 COLONIES/mL ESCHERICHIA COLI (A)  Final   Report Status 07/10/2020 FINAL  Final   Organism ID, Bacteria ESCHERICHIA COLI (A)  Final      Susceptibility   Escherichia coli - MIC*    AMPICILLIN >=32 RESISTANT Resistant     CEFAZOLIN <=4 SENSITIVE Sensitive     CEFEPIME <=0.12 SENSITIVE Sensitive     CEFTRIAXONE <=0.25 SENSITIVE Sensitive     CIPROFLOXACIN >=4 RESISTANT Resistant     GENTAMICIN <=1 SENSITIVE Sensitive     IMIPENEM <=0.25 SENSITIVE Sensitive     NITROFURANTOIN <=16 SENSITIVE Sensitive     TRIMETH/SULFA <=20 SENSITIVE Sensitive     AMPICILLIN/SULBACTAM >=32 RESISTANT Resistant     PIP/TAZO <=4 SENSITIVE Sensitive     * >=100,000 COLONIES/mL ESCHERICHIA COLI      Studies: ECHOCARDIOGRAM COMPLETE  Result Date: 07/09/2020    ECHOCARDIOGRAM REPORT   Patient Name:   Amanda Bruce [facility] Date of Exam: 07/09/2020 Medical Rec #:  [id number]          Height:       67.0 in Accession #:    [id number]         Weight:       110.0 lb Date of Birth:  03-10-1941           BSA:          1.569 m Patient Age:    88 years           BP:           131/81 mmHg Patient Gender: F                  HR:           82 bpm. Exam Location:  [facility]  Procedure: 2D Echo, Color Doppler, Cardiac Doppler and Strain Analysis Indications:     I50.31 CHF-Acute Diastolic  History:         Patient has no prior history of Echocardiogram examinations.                  Signs/Symptoms:Back pain.  Sonographer:     Charmayne Sheer RDCS (AE) Referring Phys:  [id number] [facility] Diagnosing Phys: Kate Sable MD  Sonographer Comments: Suboptimal subcostal window. Global longitudinal strain was attempted. IMPRESSIONS  1. Left ventricular ejection fraction, by estimation, is 55 to 60%. The left ventricle has normal function. The left ventricle has no regional wall motion abnormalities. Left ventricular diastolic parameters are consistent with Grade I diastolic dysfunction (impaired relaxation).  2. Right ventricular systolic function is normal. The right ventricular size is normal. There is mildly elevated pulmonary artery systolic pressure.  3. The mitral valve is normal in structure. Mild mitral valve regurgitation.  4. The aortic valve is tricuspid. Aortic valve regurgitation is not visualized.  5. The inferior vena cava is dilated in size with <50% respiratory variability, suggesting right atrial pressure of 15 mmHg. FINDINGS  Left Ventricle: Left ventricular ejection fraction, by estimation, is 55 to 60%. The left ventricle has normal function. The left ventricle has no regional wall motion abnormalities. The left ventricular internal cavity size was normal in size. There is  no left ventricular hypertrophy. Left ventricular diastolic parameters are consistent with Grade I diastolic dysfunction (impaired relaxation). Right Ventricle: The right ventricular size is normal. No increase in right ventricular wall thickness. Right ventricular systolic function is normal. There is mildly elevated pulmonary artery systolic pressure. The tricuspid regurgitant velocity is 2.41  m/s, and with an assumed right atrial pressure of 15 mmHg, the estimated right ventricular systolic  pressure is 78.9 mmHg. Left Atrium: Left atrial size was normal in size. Right Atrium: Right atrial size was normal in size. Pericardium: There is no evidence of pericardial effusion. Mitral Valve: The mitral valve is normal in structure. Mild mitral valve regurgitation. MV peak gradient, 5.3 mmHg. The mean mitral valve gradient is 1.0 mmHg. Tricuspid Valve: The tricuspid valve is normal in structure. Tricuspid valve regurgitation is mild. Aortic Valve: The aortic valve is tricuspid. Aortic valve regurgitation is not visualized. Aortic valve mean gradient measures 4.0 mmHg. Aortic valve peak gradient measures 6.1 mmHg. Aortic valve area, by VTI measures 1.62 cm. Pulmonic Valve: The pulmonic valve was normal in structure. Pulmonic valve regurgitation is not visualized. Aorta: The aortic root is normal in size and structure. Venous: The inferior vena cava is dilated in size with less than 50% respiratory variability, suggesting right atrial pressure of 15 mmHg. IAS/Shunts: No atrial level shunt detected by color flow Doppler.  LEFT VENTRICLE PLAX 2D LVIDd:         4.40 cm     Diastology LVIDs:         3.50 cm     LV e' medial:    6.64 cm/s LV PW:         1.20 cm     LV E/e' medial:  11.8 LV IVS:        1.00 cm     LV e' lateral:   7.29 cm/s LVOT diam:     1.80 cm     LV E/e' lateral: 10.8 LV SV:         40 LV SV Index:   26 LVOT Area:  2.54 cm  LV Volumes (MOD) LV vol d, MOD A2C: 58.8 ml LV vol d, MOD A4C: 67.4 ml LV vol s, MOD A2C: 27.9 ml LV vol s, MOD A4C: 35.1 ml LV SV MOD A2C:     30.9 ml LV SV MOD A4C:     67.4 ml LV SV MOD BP:      32.4 ml RIGHT VENTRICLE RV Basal diam:  2.90 cm TAPSE (M-mode): 2.8 cm LEFT ATRIUM             Index       RIGHT ATRIUM           Index LA diam:        3.90 cm 2.49 cm/m  RA Area:     16.80 cm LA Vol (A2C):   32.8 ml 20.91 ml/m RA Volume:   45.60 ml  29.07 ml/m LA Vol (A4C):   29.7 ml 18.94 ml/m LA Biplane Vol: 32.1 ml 20.47 ml/m  AORTIC VALVE                   PULMONIC  VALVE AV Area (Vmax):    1.68 cm    PV Vmax:       0.64 m/s AV Area (Vmean):   1.59 cm    PV Vmean:      46.600 cm/s AV Area (VTI):     1.62 cm    PV VTI:        0.108 m AV Vmax:           123.00 cm/s PV Peak grad:  1.6 mmHg AV Vmean:          89.700 cm/s PV Mean grad:  1.0 mmHg AV VTI:            0.248 m AV Peak Grad:      6.1 mmHg AV Mean Grad:      4.0 mmHg LVOT Vmax:         81.30 cm/s LVOT Vmean:        56.000 cm/s LVOT VTI:          0.158 m LVOT/AV VTI ratio: 0.64  AORTA Ao Root diam: 2.80 cm MITRAL VALVE                TRICUSPID VALVE MV Area (PHT): 6.48 cm     TR Peak grad:   23.2 mmHg MV Area VTI:   1.60 cm     TR Vmax:        241.00 cm/s MV Peak grad:  5.3 mmHg MV Mean grad:  1.0 mmHg     SHUNTS MV Vmax:       1.15 m/s     Systemic VTI:  0.16 m MV Vmean:      56.8 cm/s    Systemic Diam: 1.80 cm MV Decel Time: 117 msec MV E velocity: 78.60 cm/s MV A velocity: 117.00 cm/s MV E/A ratio:  0.67 Kate Sable MD Electronically signed by Kate Sable MD Signature Date/Time: 07/09/2020/3:34:56 PM    Final     Scheduled Meds: . ascorbic acid  500 mg Oral Daily  . Chlorhexidine Gluconate Cloth  6 each Topical Daily  . enoxaparin (LOVENOX) injection  40 mg Subcutaneous Q24H  . feeding supplement  237 mL Oral BID BM  . furosemide  20 mg Intravenous BID  . potassium chloride  40 mEq Oral Once    Continuous Infusions: . piperacillin-tazobactam (ZOSYN)  IV 3.375 g (07/10/20 0523)  LOS: 3 days     Annita Brod, MD [facility]   07/10/2020, 9:57 AM

## 2020-07-11 ENCOUNTER — Inpatient Hospital Stay: Payer: Medicare Other

## 2020-07-11 DIAGNOSIS — M79651 Pain in right thigh: Secondary | ICD-10-CM | POA: Diagnosis not present

## 2020-07-11 DIAGNOSIS — I5031 Acute diastolic (congestive) heart failure: Secondary | ICD-10-CM | POA: Diagnosis not present

## 2020-07-11 DIAGNOSIS — A419 Sepsis, unspecified organism: Secondary | ICD-10-CM | POA: Diagnosis not present

## 2020-07-11 DIAGNOSIS — N390 Urinary tract infection, site not specified: Secondary | ICD-10-CM | POA: Diagnosis not present

## 2020-07-11 LAB — CBC
HCT: 34.5 % — ABNORMAL LOW (ref 36.0–46.0)
Hemoglobin: 11.8 g/dL — ABNORMAL LOW (ref 12.0–15.0)
MCH: 31.6 pg (ref 26.0–34.0)
MCHC: 34.2 g/dL (ref 30.0–36.0)
MCV: 92.5 fL (ref 80.0–100.0)
Platelets: 195 10*3/uL (ref 150–400)
RBC: 3.73 MIL/uL — ABNORMAL LOW (ref 3.87–5.11)
RDW: 12.2 % (ref 11.5–15.5)
WBC: 15.9 10*3/uL — ABNORMAL HIGH (ref 4.0–10.5)
nRBC: 0 % (ref 0.0–0.2)

## 2020-07-11 LAB — BASIC METABOLIC PANEL
Anion gap: 10 (ref 5–15)
BUN: 19 mg/dL (ref 8–23)
CO2: 30 mmol/L (ref 22–32)
Calcium: 8.7 mg/dL — ABNORMAL LOW (ref 8.9–10.3)
Chloride: 98 mmol/L (ref 98–111)
Creatinine, Ser: 0.67 mg/dL (ref 0.44–1.00)
GFR, Estimated: >60 mL/min (ref >60)
Glucose, Bld: 92 mg/dL (ref 70–99)
Potassium: 3.3 mmol/L — ABNORMAL LOW (ref 3.5–5.1)
Sodium: 138 mmol/L (ref 135–145)

## 2020-07-11 LAB — PROCALCITONIN: Procalcitonin: 28.51 ng/mL

## 2020-07-11 MED ORDER — GADOBUTROL 1 MMOL/ML IV SOLN
5.0000 mL | Freq: Once | INTRAVENOUS | Status: AC | PRN
  Administered 2020-07-11: 5 mL via INTRAVENOUS

## 2020-07-11 MED ORDER — POTASSIUM CHLORIDE CRYS ER 20 MEQ PO TBCR
40.0000 meq | EXTENDED_RELEASE_TABLET | Freq: Every day | ORAL | Status: DC
  Administered 2020-07-11 – 2020-07-15 (×5): 40 meq via ORAL
  Filled 2020-07-11 (×6): qty 2

## 2020-07-11 NOTE — Progress Notes (Signed)
PROGRESS NOTE  Amanda Bruce BPZ:[id number] DOB: 1941/05/05 DOA: 07/07/2020 PCP: Valerie Roys, DO  HPI/Recap of past 51 hours: 80 year old female with past medical history only of kidney stones presented to the emergency room on 1/10 with several days of low back pain plus fever and nausea that had started that afternoon.  In the emergency room, work-up revealed patient was in septic shock secondary to UTI which have been caused by left-sided kidney stones that had nearly completely obstructed her left kidney.  Urology was consulted and patient was started on IV antibiotics and fluids.  Patient was taken for urgent cystoscopy with left ureteral stent placement.  Because of her sepsis, stone was not removed at that time.  Following stent placement, patient moved to hospital room.  Since that time, complaining of significant right thigh pain limiting ability to raise that leg.  No neurological findings and CK elevated mildly.  Sepsis since resolved.  BNP elevated at 430's echocardiogram done noting mild diastolic dysfunction.  Started on Lasix.  Antibiotics changed to Zosyn.  Right lower extremity Doppler ruled out DVT.  Continues to improve.  White count and procalcitonin now both trending downward.  Has diuresed almost 10 L of fluid.  Continues to have right thigh pain which has not improved in severity.  Assessment/Plan: Principal Problem: E. coli septic shock ([place]) secondary to UTI: Patient meets criteria for septic shock on admission given urinary source, lactic acidosis as well as hypotension.  Urinary tract infection caused by left ureteral obstruction from kidney stone.  Status post left ureteral stent.  Continue antibiotics.  Given much improved response to Zosyn, would favor keeping Zosyn on board for now until procalcitonin much improved.  Sepsis felt now be stabilized given normal lactic acid level and stabilization of blood pressure.  Urology to determine when stent should be  removed.  Stone felt to be small and nonobstructing. Active Problems:   Hypercholesteremia    Right leg weakness/pain: Unclear etiology.  Doubtful of rhabdomyolysis given minimal elevation of CPK.  Neurological exam unremarkable.  Pain persisting.  DVT ruled out by Doppler.  Checking MRI of femur with and without contrast.  If this is unremarkable, cannot think of any further work-up needed.  AKI: Resolved.  Mild elevation on 1/12 only, likely due to hypertension but since resolved with IV fluids.  Creatinine staying normal despite Lasix.  Acute diastolic heart failure: Echocardiogram notes grade 1 diastolic dysfunction.  Responding very well to IV Lasix.  Down to 10 L.  Leukocytosis: Delayed improvement, but finally starting to trend downward.  Initially suspect was secondary to infection followed by stress margination from heart failure.    Severe protein calorie malnutrition: Nutrition Status: Nutrition Problem: Severe Malnutrition Etiology: social / environmental circumstances Signs/Symptoms: moderate fat depletion,severe fat depletion,moderate muscle depletion,severe muscle depletion,percent weight loss Percent weight loss: 7.6 % (9 lb decrease from minimum reported usual weight) Interventions: MVI,Ensure Enlive (each supplement provides 350kcal and 20 grams of protein),Magic cup,Education,[employer] Instant Breakfast      Code Status: Full code  Family Communication: Updated son by phone.  Disposition Plan: Home once stent removed, fully diuresed, infection further stabilized.  This will likely be early next week.  PT seen and recommended skilled nursing, but patient declined, asking for home health   Consultants:  Urology  Procedures:  Left ureteral stent placement 1/10  Echocardiogram 2/42: Grade 1 diastolic dysfunction  Right lower extremity Doppler: No evidence of DVT.  Antimicrobials:  IV Rocephin 1/10- 1/12  IV  Zosyn 1/12-present  DVT  prophylaxis: Lovenox   Objective: Vitals:   07/11/20 0436 07/11/20 0822  BP: (!) 143/77 135/84  Pulse: 71 68  Resp: 16   Temp: 98.3 F (36.8 C) 98.5 F (36.9 C)  SpO2: 94% 97%    Intake/Output Summary (Last 24 hours) at 07/11/2020 1038 Last data filed at 07/11/2020 0558 Gross per 24 hour  Intake 120 ml  Output 4400 ml  Net -4280 ml   Filed Weights   07/07/20 1926 07/10/20 0507 07/11/20 0500  Weight: 49.9 kg 49.7 kg 55.6 kg   Body mass index is 19.2 kg/m.  Exam:   General: Alert and oriented x3, no acute distress, thin  HEENT: Normocephalic, atraumatic, mucous membranes are moist  Cardiovascular: Regular rate and rhythm, S1-S2, occasional ectopic beat  Respiratory: Clear to auscultation bilaterally  Abdomen: Soft, nontender, nondistended, positive bowel sounds  Musculoskeletal: No clubbing or cyanosis or edema.  Continued right side tenderness of right leg, limiting ability to straight raise  Skin: No skin breaks, tears or lesions  Neuro:: No focal deficits.    Psychiatry: Patient is appropriate, no evidence of psychoses   Data Reviewed: CBC: Recent Labs  Lab 07/07/20 1933 07/08/20 0544 07/09/20 0356 07/10/20 0310 07/11/20 0426  WBC 2.6* 14.1* 18.6* 20.8* 15.9*  HGB 13.6 11.0* 11.1* 10.6* 11.8*  HCT 42.8 33.7* 33.8* 31.6* 34.5*  MCV 99.1 96.3 96.3 93.5 92.5  PLT 200 147* 124* 142* 956   Basic Metabolic Panel: Recent Labs  Lab 07/07/20 1933 07/08/20 0544 07/09/20 0356 07/10/20 0310 07/11/20 0426  NA 145 137 135 136 138  K 3.6 3.1* 4.8 3.0* 3.3*  CL 105 100 103 102 98  CO2 27 24 24 25 30   GLUCOSE 131* 94 85 100* 92  BUN 30* 32* 42* 26* 19  CREATININE 0.92 0.97 1.27* 0.69 0.67  CALCIUM 9.2 8.1* 7.8* 8.3* 8.7*   GFR: Estimated Creatinine Clearance: 50 mL/min (by C-G formula based on SCr of 0.67 mg/dL). Liver Function Tests: Recent Labs  Lab 07/07/20 1933 07/09/20 0356  AST 159* 82*  ALT 62* 70*  ALKPHOS 134* 108  BILITOT 1.2 0.6   PROT 7.3 5.9*  ALBUMIN 4.0 2.9*   Recent Labs  Lab 07/07/20 1933  LIPASE 25   No results for input(s): AMMONIA in the last 168 hours. Coagulation Profile: Recent Labs  Lab 07/08/20 0544  INR 1.1   Cardiac Enzymes: Recent Labs  Lab 07/08/20 1459 07/10/20 0310  CKTOTAL 342* 127   BNP (last 3 results) No results for input(s): PROBNP in the last 8760 hours. HbA1C: No results for input(s): HGBA1C in the last 72 hours. CBG: No results for input(s): GLUCAP in the last 168 hours. Lipid Profile: No results for input(s): CHOL, HDL, LDLCALC, TRIG, CHOLHDL, LDLDIRECT in the last 72 hours. Thyroid Function Tests: No results for input(s): TSH, T4TOTAL, FREET4, T3FREE, THYROIDAB in the last 72 hours. Anemia Panel: No results for input(s): VITAMINB12, FOLATE, FERRITIN, TIBC, IRON, RETICCTPCT in the last 72 hours. Urine analysis:    Component Value Date/Time   COLORURINE YELLOW (A) 07/07/2020 1933   APPEARANCEUR CLOUDY (A) 07/07/2020 1933   APPEARANCEUR Clear 06/14/2019 0947   LABSPEC 1.011 07/07/2020 1933   PHURINE 5.0 07/07/2020 1933   GLUCOSEU NEGATIVE 07/07/2020 1933   HGBUR MODERATE (A) 07/07/2020 1933   BILIRUBINUR NEGATIVE 07/07/2020 1933   BILIRUBINUR Negative 06/14/2019 [place] 07/07/2020 1933   PROTEINUR 30 (A) 07/07/2020 1933   NITRITE POSITIVE (A) 07/07/2020  [place] (A) 07/07/2020 1933   Sepsis Labs: @LABRCNTIP (procalcitonin:4,lacticidven:4)  ) Recent Results (from the past 240 hour(s))  Novel Coronavirus, NAA ([facility])     Status: None   Collection Time: 07/03/20  9:01 AM   Specimen: Nasopharyngeal(NP) swabs in vial transport medium  Result Value Ref Range Status   SARS-CoV-2, NAA Not Detected Not Detected Final    Comment: This nucleic acid amplification test was developed and its performance characteristics determined by [employer]. Nucleic acid amplification tests include RT-PCR and TMA. This test has not  been FDA cleared or approved. This test has been authorized by FDA under an Emergency Use Authorization (EUA). This test is only authorized for the duration of time the declaration that circumstances exist justifying the authorization of the emergency use of in vitro diagnostic tests for detection of SARS-CoV-2 virus and/or diagnosis of COVID-19 infection under section 564(b)(1) of the Act, 21 U.S.C. [id number]) (1), unless the authorization is terminated or revoked sooner. When diagnostic testing is negative, the possibility of a false negative result should be considered in the context of a patient's recent exposures and the presence of clinical signs and symptoms consistent with COVID-19. An individual without symptoms of COVID-19 and who is not shedding SARS-CoV-2 virus wo uld expect to have a negative (not detected) result in this assay.   SARS-COV-2, NAA 2 DAY TAT     Status: None   Collection Time: 07/03/20  9:01 AM  Result Value Ref Range Status   SARS-CoV-2, NAA 2 DAY TAT Performed  Final  Urine culture     Status: Abnormal   Collection Time: 07/07/20  7:33 PM   Specimen: Urine, Random  Result Value Ref Range Status   Specimen Description   Final    URINE, RANDOM Performed at [facility], [address] [place], [place] [address]    Special Requests   Final    NONE Performed at [facility], [address] [place], [place] [address]    Culture >=100,000 COLONIES/mL ESCHERICHIA COLI (A)  Final   Report Status 07/10/2020 FINAL  Final   Organism ID, Bacteria ESCHERICHIA COLI (A)  Final      Susceptibility   Escherichia coli - MIC*    AMPICILLIN >=32 RESISTANT Resistant     CEFAZOLIN <=4 SENSITIVE Sensitive     CEFEPIME <=0.12 SENSITIVE Sensitive     CEFTRIAXONE <=0.25 SENSITIVE Sensitive     CIPROFLOXACIN >=4 RESISTANT Resistant     GENTAMICIN <=1 SENSITIVE Sensitive     IMIPENEM <=0.25 SENSITIVE Sensitive     NITROFURANTOIN <=16 SENSITIVE  Sensitive     TRIMETH/SULFA <=20 SENSITIVE Sensitive     AMPICILLIN/SULBACTAM >=32 RESISTANT Resistant     PIP/TAZO <=4 SENSITIVE Sensitive     * >=100,000 COLONIES/mL ESCHERICHIA COLI  Resp Panel by RT-PCR (Flu A&B, Covid) Nasopharyngeal Swab     Status: None   Collection Time: 07/07/20 10:56 PM   Specimen: Nasopharyngeal Swab; Nasopharyngeal(NP) swabs in vial transport medium  Result Value Ref Range Status   SARS Coronavirus 2 by RT PCR NEGATIVE NEGATIVE Final    Comment: (NOTE) SARS-CoV-2 target nucleic acids are NOT DETECTED.  The SARS-CoV-2 RNA is generally detectable in upper respiratory specimens during the acute phase of infection. The lowest concentration of SARS-CoV-2 viral copies this assay can detect is 138 copies/mL. A negative result does not preclude SARS-Cov-2 infection and should not be used as the sole basis for treatment or other patient management decisions. A  negative result may occur with  improper specimen collection/handling, submission of specimen other than nasopharyngeal swab, presence of viral mutation(s) within the areas targeted by this assay, and inadequate number of viral copies(<138 copies/mL). A negative result must be combined with clinical observations, patient history, and epidemiological information. The expected result is Negative.  Fact Sheet for Patients:  EntrepreneurPulse.com.au  Fact Sheet for Healthcare Providers:  IncredibleEmployment.be  This test is no t yet approved or cleared by the [place] FDA and  has been authorized for detection and/or diagnosis of SARS-CoV-2 by FDA under an Emergency Use Authorization (EUA). This EUA will remain  in effect (meaning this test can be used) for the duration of the COVID-19 declaration under Section 564(b)(1) of the Act, 21 U.S.C.section 360bbb-3(b)(1), unless the authorization is terminated  or revoked sooner.       Influenza A by PCR NEGATIVE  NEGATIVE Final   Influenza B by PCR NEGATIVE NEGATIVE Final    Comment: (NOTE) The Xpert Xpress SARS-CoV-2/FLU/RSV plus assay is intended as an aid in the diagnosis of influenza from Nasopharyngeal swab specimens and should not be used as a sole basis for treatment. Nasal washings and aspirates are unacceptable for Xpert Xpress SARS-CoV-2/FLU/RSV testing.  Fact Sheet for Patients: EntrepreneurPulse.com.au  Fact Sheet for Healthcare Providers: IncredibleEmployment.be  This test is not yet approved or cleared by the [place] FDA and has been authorized for detection and/or diagnosis of SARS-CoV-2 by FDA under an Emergency Use Authorization (EUA). This EUA will remain in effect (meaning this test can be used) for the duration of the COVID-19 declaration under Section 564(b)(1) of the Act, 21 U.S.C. section 360bbb-3(b)(1), unless the authorization is terminated or revoked.  Performed at [facility], [address] [place], [place] [address]   Urine Culture     Status: Abnormal   Collection Time: 07/08/20 12:59 AM   Specimen: PATH Other; Urine  Result Value Ref Range Status   Specimen Description   Final    URINE, RANDOM Performed at [facility], [address] [place], [place] [address]    Special Requests   Final    LEFT RENAL PELVIS Performed at [facility], [place], [place] [address]    Culture >=100,000 COLONIES/mL ESCHERICHIA COLI (A)  Final   Report Status 07/10/2020 FINAL  Final   Organism ID, Bacteria ESCHERICHIA COLI (A)  Final      Susceptibility   Escherichia coli - MIC*    AMPICILLIN >=32 RESISTANT Resistant     CEFAZOLIN <=4 SENSITIVE Sensitive     CEFEPIME <=0.12 SENSITIVE Sensitive     CEFTRIAXONE <=0.25 SENSITIVE Sensitive     CIPROFLOXACIN >=4 RESISTANT Resistant     GENTAMICIN <=1 SENSITIVE Sensitive     IMIPENEM <=0.25 SENSITIVE Sensitive     NITROFURANTOIN <=16  SENSITIVE Sensitive     TRIMETH/SULFA <=20 SENSITIVE Sensitive     AMPICILLIN/SULBACTAM >=32 RESISTANT Resistant     PIP/TAZO <=4 SENSITIVE Sensitive     * >=100,000 COLONIES/mL ESCHERICHIA COLI      Studies: DG Abd 1 View  Result Date: 07/10/2020 CLINICAL DATA:  Recent stent placement.  Recent ureteral calculus. EXAM: ABDOMEN - 1 VIEW COMPARISON:  CT abdomen and pelvis July 07, 2020; retrograde pyelogram July 08, 2020 FINDINGS: Double-J stent extends from the L1-2 level to the bladder. No abnormal calcifications evident. There is no bowel dilatation or air-fluid level to suggest bowel obstruction. No free air. Mild stool in colon. Scoliosis and  arthropathy noted in the lumbar region. IMPRESSION: Double-J stent on the left from the L1-2 level to the bladder. No abnormal calcifications. No bowel obstruction or free air. Electronically Signed   By: Lowella Grip III M.D.   On: 07/10/2020 13:18   US Venous Img Lower Unilateral Right (DVT)  Result Date: 07/10/2020 CLINICAL DATA:  80 year old female with a history of right thigh pain EXAM: RIGHT LOWER EXTREMITY VENOUS DOPPLER ULTRASOUND TECHNIQUE: Gray-scale sonography with graded compression, as well as color Doppler and duplex ultrasound were performed to evaluate the lower extremity deep venous systems from the level of the common femoral vein and including the common femoral, femoral, profunda femoral, popliteal and calf veins including the posterior tibial, peroneal and gastrocnemius veins when visible. The superficial great saphenous vein was also interrogated. Spectral Doppler was utilized to evaluate flow at rest and with distal augmentation maneuvers in the common femoral, femoral and popliteal veins. COMPARISON:  None. FINDINGS: Contralateral Common Femoral Vein: Respiratory phasicity is normal and symmetric with the symptomatic side. No evidence of thrombus. Normal compressibility. Common Femoral Vein: No evidence of thrombus. Normal  compressibility, respiratory phasicity and response to augmentation. Saphenofemoral Junction: No evidence of thrombus. Normal compressibility and flow on color Doppler imaging. Profunda Femoral Vein: No evidence of thrombus. Normal compressibility and flow on color Doppler imaging. Femoral Vein: No evidence of thrombus. Normal compressibility, respiratory phasicity and response to augmentation. Popliteal Vein: No evidence of thrombus. Normal compressibility, respiratory phasicity and response to augmentation. Calf Veins: No evidence of thrombus. Normal compressibility and flow on color Doppler imaging. Superficial Great Saphenous Vein: No evidence of thrombus. Normal compressibility and flow on color Doppler imaging. Other Findings: Lentiform anechoic fluid collection in the popliteal region measures 2.5 cm, compatible with Baker's cyst. IMPRESSION: Sonographic survey right lower extremity negative for DVT. Baker's cyst Electronically Signed   By: Corrie Mckusick D.O.   On: 07/10/2020 14:35    Scheduled Meds: . ascorbic acid  500 mg Oral Daily  . Chlorhexidine Gluconate Cloth  6 each Topical Daily  . enoxaparin (LOVENOX) injection  40 mg Subcutaneous Q24H  . feeding supplement  237 mL Oral BID BM  . furosemide  20 mg Intravenous BID  . polyethylene glycol  17 g Oral Daily  . potassium chloride  40 mEq Oral Daily    Continuous Infusions: . piperacillin-tazobactam (ZOSYN)  IV 3.375 g (07/11/20 0827)     LOS: 4 days     Annita Brod, MD [facility]   07/11/2020, 10:38 AM

## 2020-07-11 NOTE — Progress Notes (Signed)
PT Cancellation Note  Patient Details Name: REANNON CANDELLA MRN: [id number] DOB: 1941-05-24   Cancelled Treatment:    Reason Eval/Treat Not Completed: Patient at procedure or test/unavailable:  Multiple attempts made to work with patient this date with pt out of room for imaging.  Will attempt to see pt at a future date/time as medically appropriate.     Linus Salmons PT, DPT 07/11/20, 3:57 PM

## 2020-07-12 DIAGNOSIS — A419 Sepsis, unspecified organism: Secondary | ICD-10-CM | POA: Diagnosis not present

## 2020-07-12 DIAGNOSIS — R6521 Severe sepsis with septic shock: Secondary | ICD-10-CM | POA: Diagnosis not present

## 2020-07-12 LAB — BASIC METABOLIC PANEL
Anion gap: 12 (ref 5–15)
BUN: 27 mg/dL — ABNORMAL HIGH (ref 8–23)
CO2: 28 mmol/L (ref 22–32)
Calcium: 9 mg/dL (ref 8.9–10.3)
Chloride: 98 mmol/L (ref 98–111)
Creatinine, Ser: 0.65 mg/dL (ref 0.44–1.00)
GFR, Estimated: >60 mL/min (ref >60)
Glucose, Bld: 112 mg/dL — ABNORMAL HIGH (ref 70–99)
Potassium: 3.1 mmol/L — ABNORMAL LOW (ref 3.5–5.1)
Sodium: 138 mmol/L (ref 135–145)

## 2020-07-12 LAB — CBC
HCT: 37.6 % (ref 36.0–46.0)
Hemoglobin: 12.9 g/dL (ref 12.0–15.0)
MCH: 31.6 pg (ref 26.0–34.0)
MCHC: 34.3 g/dL (ref 30.0–36.0)
MCV: 92.2 fL (ref 80.0–100.0)
Platelets: 233 10*3/uL (ref 150–400)
RBC: 4.08 MIL/uL (ref 3.87–5.11)
RDW: 12.2 % (ref 11.5–15.5)
WBC: 8.4 10*3/uL (ref 4.0–10.5)
nRBC: 0 % (ref 0.0–0.2)

## 2020-07-12 LAB — PROCALCITONIN: Procalcitonin: 12.86 ng/mL

## 2020-07-12 MED ORDER — MAGNESIUM SULFATE 2 GM/50ML IV SOLN
2.0000 g | Freq: Once | INTRAVENOUS | Status: AC
  Administered 2020-07-12: 2 g via INTRAVENOUS
  Filled 2020-07-12: qty 50

## 2020-07-12 MED ORDER — POTASSIUM CHLORIDE CRYS ER 20 MEQ PO TBCR
40.0000 meq | EXTENDED_RELEASE_TABLET | Freq: Once | ORAL | Status: AC
  Administered 2020-07-12: 40 meq via ORAL
  Filled 2020-07-12: qty 2

## 2020-07-12 NOTE — Progress Notes (Signed)
PROGRESS NOTE  Amanda Bruce [id number] DOB: [birth year] DOA: 07/07/2020 PCP: Valerie Roys, DO  HPI/Recap of past 12 hours: 80 year old female with past medical history only of kidney stones presented to the emergency room on 1/10 with several days of low back pain plus fever and nausea that had started that afternoon.  In the emergency room, work-up revealed patient was in septic shock secondary to UTI which have been caused by left-sided kidney stones that had nearly completely obstructed her left kidney.  Urology was consulted and patient was started on IV antibiotics and fluids.  Patient was taken for urgent cystoscopy with left ureteral stent placement.  Because of her sepsis, stone was not removed at that time.  Following stent placement, patient moved to hospital room.  Since that time, complaining of significant right thigh pain limiting ability to raise that leg.  No neurological findings and CK elevated mildly.  Sepsis since resolved.  BNP elevated at 430's echocardiogram done noting mild diastolic dysfunction.  Started on Lasix.  Antibiotics changed to Zosyn.  Right lower extremity Doppler ruled out DVT.  Continues to improve.  White count and procalcitonin now both trending downward.  Has diuresed almost 10 L of fluid.  Continues to have right thigh pain which has not improved in severity.  07/12/20: Seen and examined at her bedside.  She reports RLE weakness and pain.  Work up has been unrevealing.  Assessment/Plan: Principal Problem:   Septic shock (HCC) Active Problems:   Hypercholesteremia   Acute lower UTI   Right leg weakness   Ureteral obstruction, left   Cardiomegaly   Acute diastolic CHF (congestive heart failure) (HCC)   Protein-calorie malnutrition, severe  Severe sepsis ([place]) secondary to [facility] UTI:  Patient meets criteria for severe sepsis on admission given urinary source, lactic acidosis as well as hypotension.  Checking with pharmacy to see if she  has received any vasopressors during this admission.  Urinary tract infection caused by left ureteral obstruction from kidney stone.  Status post left ureteral stent placement.  Continue antibiotics.  Procalcitonin is down trending from >150 to 12 . Urology to determine when stent should be removed.  Stone felt to be small and nonobstructing.  Refractory Hypokalemia K+ 3.1 Repleted orally Added 1 dose IV mag 2 g x1  Repeat BMP and mag level in the AM  Right leg weakness/pain: Unclear etiology.   Doubtful of rhabdomyolysis given minimal elevation of CPK. CPK leval has normalized   Neurological exam unremarkable.   Pain persisting.   DVT ruled out by Doppler.  Checking MRI of femur with and without contrast>> non specific findings.  Resolved AKI: She is back to her baseline Cr Continue to monitor UO Continue to avoid nephrotoxins, dehydration and hypotension  Acute diastolic heart failure:  Echocardiogram notes grade 1 diastolic dysfunction.   Responding very well to IV Lasix.   Net I&O -5.3L  Resolved Leukocytosis:  Also afebrile Continue to monitor.  Severe protein calorie malnutrition: Nutrition Status: Nutrition Problem: Severe Malnutrition Etiology: social / environmental circumstances Signs/Symptoms: moderate fat depletion,severe fat depletion,moderate muscle depletion,severe muscle depletion,percent weight loss Percent weight loss: 7.6 % (9 lb decrease from minimum reported usual weight) Interventions: MVI,Ensure Enlive (each supplement provides 350kcal and 20 grams of protein),Magic cup,Education,[employer] Instant Breakfast        Code Status: Full code  Family Communication: None at bedside  Disposition Plan: Likely dc to home   Consultants:  Urology  Procedures:  2D echo   Antimicrobials:  Zosyn  DVT prophylaxis:  SQ Lovenox daily  Status is: Inpatient    Dispo: The patient is from:Home              Anticipated d/c is IR:CVEL                Anticipated d/c date is: 07/15/20              Patient currently not stable due to ongoing treatment for severe sepsis due to ecoli UTI         Objective: Vitals:   07/12/20 0436 07/12/20 0500 07/12/20 0830 07/12/20 1147  BP: 139/77  (!) 153/82 121/65  Pulse: 74  74 78  Resp: 20  18 16   Temp: 98.9 F (37.2 C)  98.4 F (36.9 C) 98 F (36.7 C)  TempSrc: Oral  Oral Oral  SpO2: 95%  92% 98%  Weight:  55.1 kg    Height:        Intake/Output Summary (Last 24 hours) at 07/12/2020 1256 Last data filed at 07/12/2020 0959 Gross per 24 hour  Intake 641.64 ml  Output 2600 ml  Net -1958.36 ml   Filed Weights   07/10/20 0507 07/11/20 0500 07/12/20 0500  Weight: 49.7 kg 55.6 kg 55.1 kg    Exam:  . General: 80 y.o. year-old female well developed well nourished in no acute distress.  Alert and oriented x3. . Cardiovascular: Regular rate and rhythm with no rubs or gallops.  No thyromegaly or JVD noted.   Marland Kitchen Respiratory: Clear to auscultation with no wheezes or rales. Good inspiratory effort. . Abdomen: Soft nontender nondistended with normal bowel sounds x4 quadrants. . Musculoskeletal: No lower extremity edema. 2/4 pulses in all 4 extremities. . Skin: No ulcerative lesions noted or rashes, . Psychiatry: Mood is appropriate for condition and setting   Data Reviewed: CBC: Recent Labs  Lab 07/08/20 0544 07/09/20 0356 07/10/20 0310 07/11/20 0426 07/12/20 0512  WBC 14.1* 18.6* 20.8* 15.9* 8.4  HGB 11.0* 11.1* 10.6* 11.8* 12.9  HCT 33.7* 33.8* 31.6* 34.5* 37.6  MCV 96.3 96.3 93.5 92.5 92.2  PLT 147* 124* 142* 195 381   Basic Metabolic Panel: Recent Labs  Lab 07/08/20 0544 07/09/20 0356 07/10/20 0310 07/11/20 0426 07/12/20 0512  NA 137 135 136 138 138  K 3.1* 4.8 3.0* 3.3* 3.1*  CL 100 103 102 98 98  CO2 24 24 25 30 28   GLUCOSE 94 85 100* 92 112*  BUN 32* 42* 26* 19 27*  CREATININE 0.97 1.27* 0.69 0.67 0.65  CALCIUM 8.1* 7.8* 8.3* 8.7* 9.0   GFR: Estimated  Creatinine Clearance: 49.6 mL/min (by C-G formula based on SCr of 0.65 mg/dL). Liver Function Tests: Recent Labs  Lab 07/07/20 1933 07/09/20 0356  AST 159* 82*  ALT 62* 70*  ALKPHOS 134* 108  BILITOT 1.2 0.6  PROT 7.3 5.9*  ALBUMIN 4.0 2.9*   Recent Labs  Lab 07/07/20 1933  LIPASE 25   No results for input(s): AMMONIA in the last 168 hours. Coagulation Profile: Recent Labs  Lab 07/08/20 0544  INR 1.1   Cardiac Enzymes: Recent Labs  Lab 07/08/20 1459 07/10/20 0310  CKTOTAL 342* 127   BNP (last 3 results) No results for input(s): PROBNP in the last 8760 hours. HbA1C: No results for input(s): HGBA1C in the last 72 hours. CBG: No results for input(s): GLUCAP in the last 168 hours. Lipid Profile: No results for input(s): CHOL, HDL, LDLCALC, TRIG, CHOLHDL, LDLDIRECT in the last 72 hours.  Thyroid Function Tests: No results for input(s): TSH, T4TOTAL, FREET4, T3FREE, THYROIDAB in the last 72 hours. Anemia Panel: No results for input(s): VITAMINB12, FOLATE, FERRITIN, TIBC, IRON, RETICCTPCT in the last 72 hours. Urine analysis:    Component Value Date/Time   COLORURINE YELLOW (A) 07/07/2020 1933   APPEARANCEUR CLOUDY (A) 07/07/2020 1933   APPEARANCEUR Clear 06/14/2019 0947   LABSPEC 1.011 07/07/2020 1933   PHURINE 5.0 07/07/2020 1933   GLUCOSEU NEGATIVE 07/07/2020 1933   HGBUR MODERATE (A) 07/07/2020 1933   BILIRUBINUR NEGATIVE 07/07/2020 1933   BILIRUBINUR Negative 06/14/2019 [place] 07/07/2020 1933   PROTEINUR 30 (A) 07/07/2020 1933   NITRITE POSITIVE (A) 07/07/2020 1933   LEUKOCYTESUR TRACE (A) 07/07/2020 1933   Sepsis Labs: @LABRCNTIP (procalcitonin:4,lacticidven:4)  ) Recent Results (from the past 240 hour(s))  Novel Coronavirus, NAA ([facility])     Status: None   Collection Time: 07/03/20  9:01 AM   Specimen: Nasopharyngeal(NP) swabs in vial transport medium  Result Value Ref Range Status   SARS-CoV-2, NAA Not Detected Not Detected  Final    Comment: This nucleic acid amplification test was developed and its performance characteristics determined by [employer]. Nucleic acid amplification tests include RT-PCR and TMA. This test has not been FDA cleared or approved. This test has been authorized by FDA under an Emergency Use Authorization (EUA). This test is only authorized for the duration of time the declaration that circumstances exist justifying the authorization of the emergency use of in vitro diagnostic tests for detection of SARS-CoV-2 virus and/or diagnosis of COVID-19 infection under section 564(b)(1) of the Act, 21 U.S.C. [id number]) (1), unless the authorization is terminated or revoked sooner. When diagnostic testing is negative, the possibility of a false negative result should be considered in the context of a patient's recent exposures and the presence of clinical signs and symptoms consistent with COVID-19. An individual without symptoms of COVID-19 and who is not shedding SARS-CoV-2 virus wo uld expect to have a negative (not detected) result in this assay.   SARS-COV-2, NAA 2 DAY TAT     Status: None   Collection Time: 07/03/20  9:01 AM  Result Value Ref Range Status   SARS-CoV-2, NAA 2 DAY TAT Performed  Final  Urine culture     Status: Abnormal   Collection Time: 07/07/20  7:33 PM   Specimen: Urine, Random  Result Value Ref Range Status   Specimen Description   Final    URINE, RANDOM Performed at [facility], [address] [place], [place] [address]    Special Requests   Final    NONE Performed at [facility], [address] [place],  [address]    Culture >=100,000 COLONIES/mL ESCHERICHIA COLI (A)  Final   Report Status 07/10/2020 FINAL  Final   Organism ID, Bacteria ESCHERICHIA COLI (A)  Final      Susceptibility   Escherichia coli - MIC*    AMPICILLIN >=32 RESISTANT Resistant     CEFAZOLIN <=4 SENSITIVE Sensitive     CEFEPIME <=0.12  SENSITIVE Sensitive     CEFTRIAXONE <=0.25 SENSITIVE Sensitive     CIPROFLOXACIN >=4 RESISTANT Resistant     GENTAMICIN <=1 SENSITIVE Sensitive     IMIPENEM <=0.25 SENSITIVE Sensitive     NITROFURANTOIN <=16 SENSITIVE Sensitive     TRIMETH/SULFA <=20 SENSITIVE Sensitive     AMPICILLIN/SULBACTAM >=32 RESISTANT Resistant     PIP/TAZO <=4 SENSITIVE Sensitive     * >=100,000 COLONIES/mL ESCHERICHIA COLI  Resp Panel by RT-PCR (Flu A&B, Covid) Nasopharyngeal Swab     Status: None   Collection Time: 07/07/20 10:56 PM   Specimen: Nasopharyngeal Swab; Nasopharyngeal(NP) swabs in vial transport medium  Result Value Ref Range Status   SARS Coronavirus 2 by RT PCR NEGATIVE NEGATIVE Final    Comment: (NOTE) SARS-CoV-2 target nucleic acids are NOT DETECTED.  The SARS-CoV-2 RNA is generally detectable in upper respiratory specimens during the acute phase of infection. The lowest concentration of SARS-CoV-2 viral copies this assay can detect is 138 copies/mL. A negative result does not preclude SARS-Cov-2 infection and should not be used as the sole basis for treatment or other patient management decisions. A negative result may occur with  improper specimen collection/handling, submission of specimen other than nasopharyngeal swab, presence of viral mutation(s) within the areas targeted by this assay, and inadequate number of viral copies(<138 copies/mL). A negative result must be combined with clinical observations, patient history, and epidemiological information. The expected result is Negative.  Fact Sheet for Patients:  EntrepreneurPulse.com.au  Fact Sheet for Healthcare Providers:  IncredibleEmployment.be  This test is no t yet approved or cleared by the [place] FDA and  has been authorized for detection and/or diagnosis of SARS-CoV-2 by FDA under an Emergency Use Authorization (EUA). This EUA will remain  in effect (meaning this test can be  used) for the duration of the COVID-19 declaration under Section 564(b)(1) of the Act, 21 U.S.C.section 360bbb-3(b)(1), unless the authorization is terminated  or revoked sooner.       Influenza A by PCR NEGATIVE NEGATIVE Final   Influenza B by PCR NEGATIVE NEGATIVE Final    Comment: (NOTE) The Xpert Xpress SARS-CoV-2/FLU/RSV plus assay is intended as an aid in the diagnosis of influenza from Nasopharyngeal swab specimens and should not be used as a sole basis for treatment. Nasal washings and aspirates are unacceptable for Xpert Xpress SARS-CoV-2/FLU/RSV testing.  Fact Sheet for Patients: EntrepreneurPulse.com.au  Fact Sheet for Healthcare Providers: IncredibleEmployment.be  This test is not yet approved or cleared by the [place] FDA and has been authorized for detection and/or diagnosis of SARS-CoV-2 by FDA under an Emergency Use Authorization (EUA). This EUA will remain in effect (meaning this test can be used) for the duration of the COVID-19 declaration under Section 564(b)(1) of the Act, 21 U.S.C. section 360bbb-3(b)(1), unless the authorization is terminated or revoked.  Performed at [facility], [address], [place], [place] [address]   Urine Culture     Status: Abnormal   Collection Time: 07/08/20 12:59 AM   Specimen: PATH Other; Urine  Result Value Ref Range Status   Specimen Description   Final    URINE, RANDOM Performed at [facility], [address], [place], [place] [address]    Special Requests   Final    LEFT RENAL PELVIS Performed at [facility], [address] [place], [place] [address]    Culture >=100,000 COLONIES/mL ESCHERICHIA COLI (A)  Final   Report Status 07/10/2020 FINAL  Final   Organism ID, Bacteria ESCHERICHIA COLI (A)  Final      Susceptibility   Escherichia coli - MIC*    AMPICILLIN >=32 RESISTANT Resistant     CEFAZOLIN <=4 SENSITIVE Sensitive     CEFEPIME  <=0.12 SENSITIVE Sensitive     CEFTRIAXONE <=0.25 SENSITIVE Sensitive     CIPROFLOXACIN >=4 RESISTANT Resistant     GENTAMICIN <=1 SENSITIVE Sensitive     IMIPENEM <=0.25 SENSITIVE Sensitive  NITROFURANTOIN <=16 SENSITIVE Sensitive     TRIMETH/SULFA <=20 SENSITIVE Sensitive     AMPICILLIN/SULBACTAM >=32 RESISTANT Resistant     PIP/TAZO <=4 SENSITIVE Sensitive     * >=100,000 COLONIES/mL ESCHERICHIA COLI      Studies: MR FEMUR RIGHT W WO CONTRAST  Result Date: 07/11/2020 CLINICAL DATA:  Right thigh pain and weakness.  Mildly elevated CPK EXAM: MRI OF THE RIGHT FEMUR WITHOUT AND WITH CONTRAST TECHNIQUE: Multiplanar, multisequence MR imaging of the right femur was performed both before and after administration of intravenous contrast. CONTRAST:  80mL GADAVIST GADOBUTROL 1 MMOL/ML IV SOLN COMPARISON:  None. FINDINGS: Bones/Joint/Cartilage No acute fracture. No dislocation. 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 Subchondral marrow edema at the superior aspect of the right femoral head. No evidence of avascular necrosis. Trace joint effusion. Right knee joint is within normal limits without significant arthropathy or evidence of internal derangement. Heterogeneous appearance of the bone marrow signal of the visualized pelvis without evidence of a marrow replacing lesion. Ligaments Unremarkable. Muscles and Tendons Mild intramuscular edema within the right gluteus medius and gluteus minimus muscles as well as within the right abductor muscle compartment. Normal muscle bulk without atrophy or fatty infiltration. Tendinous structures about the hip and knee appear intact. Soft tissues Mild subcutaneous edema overlying the bilateral gluteal musculature. No organized fluid collection. No deep fascial edema or enhancement. Foley catheter is position within the bladder lumen. Fibroid uterus. IMPRESSION:  1. Mild intramuscular edema within the right gluteus medius and gluteus minimus muscles as well as within the right abductor muscle compartment. Findings are nonspecific and could be related to muscle strain or myositis. 2. Severe osteoarthritis of the right hip. 3. Heterogeneous appearance of the bone marrow signal of the pelvis without discrete marrow replacing lesion. Findings are nonspecific but can be seen in the setting of chronic anemia, smoking, and/or obesity. Electronically Signed   By: Davina Poke D.O.   On: 07/11/2020 15:26    Scheduled Meds: . ascorbic acid  500 mg Oral Daily  . Chlorhexidine Gluconate Cloth  6 each Topical Daily  . enoxaparin (LOVENOX) injection  40 mg Subcutaneous Q24H  . feeding supplement  237 mL Oral BID BM  . furosemide  20 mg Intravenous BID  . polyethylene glycol  17 g Oral Daily  . potassium chloride  40 mEq Oral Daily  . potassium chloride  40 mEq Oral Once    Continuous Infusions: . piperacillin-tazobactam (ZOSYN)  IV 3.375 g (07/12/20 0507)     LOS: 5 days     Kayleen Memos, MD [facility] Pager [phone]  If 7PM-7AM, please contact night-coverage [email or web address] Password [facility] 07/12/2020, 12:56 PM

## 2020-07-12 NOTE — Progress Notes (Signed)
Mobility Specialist - Progress Note   07/12/20 1417  Mobility  Activity Ambulated in room (seated exercises)  Level of Assistance Standby assist, set-up cues, supervision of patient - no hands on (CGA for safety)  Assistive Device Front wheel walker  Distance Ambulated (ft) 70 ft  Mobility Response Tolerated well  Mobility performed by Mobility specialist  $Mobility charge 1 Mobility    Pre-mobility: 104 HR, 94% SpO2 During mobility: 111 HR, 96% SpO2 Post-mobility: 111 HR, 93% SpO2   Pt laying in bed upon arrival w/ son present in room upon arrival. Pt agreed to session. Pt states her RLE has been sore during some activity. Soreness doesn't limit her mobility. Pt able to get to EOB SBA. Pt S2S to RW w/ CGA. Pt ambulated 3' total in room utilizing RW w/ SBA. CGA utilized for safety. No LOB noted. No c/o pain or SOB. Pt agreeable to go to recliner. Pt very motivated this session. Pt performed seated exercises: kicks x10, ankle pumps x 10, calf raises x10, and marches x 10. Pt encouraged to perform seated exercises in between sessions. Overall, pt tolerated session well. Pt left sitting on recliner w/ son present in room. All needs placed in reach. Nurse was notified.     Shawonda Kerce Mobility Specialist  07/12/20, 2:24 PM

## 2020-07-12 NOTE — Progress Notes (Signed)
PT Cancellation Note  Patient Details Name: Amanda Bruce MRN: [id number] DOB: 08-28-1940   Cancelled Treatment:     PT attempt. Pt currently working with mobility tech. Acute PT will continue to follow and progress as able per POC.    Willette Pa 07/12/2020, 2:12 PM

## 2020-07-13 DIAGNOSIS — A419 Sepsis, unspecified organism: Secondary | ICD-10-CM | POA: Diagnosis not present

## 2020-07-13 DIAGNOSIS — R6521 Severe sepsis with septic shock: Secondary | ICD-10-CM | POA: Diagnosis not present

## 2020-07-13 LAB — BASIC METABOLIC PANEL
Anion gap: 12 (ref 5–15)
BUN: 38 mg/dL — ABNORMAL HIGH (ref 8–23)
CO2: 29 mmol/L (ref 22–32)
Calcium: 9 mg/dL (ref 8.9–10.3)
Chloride: 97 mmol/L — ABNORMAL LOW (ref 98–111)
Creatinine, Ser: 0.65 mg/dL (ref 0.44–1.00)
GFR, Estimated: >60 mL/min (ref >60)
Glucose, Bld: 107 mg/dL — ABNORMAL HIGH (ref 70–99)
Potassium: 3.5 mmol/L (ref 3.5–5.1)
Sodium: 138 mmol/L (ref 135–145)

## 2020-07-13 LAB — CBC
HCT: 39.6 % (ref 36.0–46.0)
Hemoglobin: 13.2 g/dL (ref 12.0–15.0)
MCH: 31.2 pg (ref 26.0–34.0)
MCHC: 33.3 g/dL (ref 30.0–36.0)
MCV: 93.6 fL (ref 80.0–100.0)
Platelets: 287 10*3/uL (ref 150–400)
RBC: 4.23 MIL/uL (ref 3.87–5.11)
RDW: 12.4 % (ref 11.5–15.5)
WBC: 7.5 10*3/uL (ref 4.0–10.5)
nRBC: 0 % (ref 0.0–0.2)

## 2020-07-13 LAB — MAGNESIUM: Magnesium: 2.4 mg/dL (ref 1.7–2.4)

## 2020-07-13 LAB — PROCALCITONIN: Procalcitonin: 6.36 ng/mL

## 2020-07-13 MED ORDER — SENNA 8.6 MG PO TABS
2.0000 | ORAL_TABLET | Freq: Every day | ORAL | Status: DC
  Administered 2020-07-13 – 2020-07-15 (×2): 17.2 mg via ORAL
  Filled 2020-07-13 (×3): qty 2

## 2020-07-13 NOTE — Progress Notes (Signed)
Physical Therapy Treatment Patient Details Name: Amanda Bruce MRN: [id number] DOB: 1941-05-27 Today's Date: 07/13/2020    History of Present Illness Per MD notes: Pt is a 80 year old female with past medical history only of kidney stones presented to the emergency room on 1/10 with several days of low back pain plus fever and nausea that had started that afternoon.  In the emergency room, work-up revealed patient was in septic shock secondary to UTI which have been caused by left-sided kidney stones that had nearly completely obstructed her left kidney.  Urology was consulted and patient was started on IV antibiotics and fluids.  Patient was taken for urgent cystoscopy with left ureteral stent placement.  Because of her sepsis, stone was not removed.  Assessment also includes RLE weakness of unclear etiology and cardiomegaly.  PMH includes osteoporosis.    PT Comments    Pt received supine in bed alert and eager to participate with PT interventions today.  Pt able to complete supine<>sit transfer with supervision - extra time required due to decreased R LE strength. Pt completed sit<>stand transfer with supervision min VC for appropriate hand placement. Completed therapeutic exercise as follows: ankle pumps x20, heel slides x10 each (VCs for form and pace), standing heel raises x10 and standing hip extension x10 (VCs for pace and form provided - pt able to make appropriate changes following VCs). Pt ambulated for 243ft today using RW, VC provided for more upright posture and appropriate RW management. Pt returned to supine position per pt request following interventions today with call bell within reach and bed alarm set. Overall pt tolerated interventions well today with minimal increase in fatigue.      Follow Up Recommendations  SNF;Supervision for mobility/OOB;Supervision/Assistance - 24 hour;Supervision - Intermittent;Other (comment)     Equipment Recommendations       Recommendations  for Other Services       Precautions / Restrictions Precautions Precautions: Fall Restrictions Weight Bearing Restrictions: No    Mobility  Bed Mobility Overal bed mobility: Modified Independent Bed Mobility: Supine to Sit     Supine to sit: Supervision     General bed mobility comments: Pt able to maneuver RLE with increased time due to weakness  Transfers Overall transfer level: Needs assistance Equipment used: Rolling walker (2 wheeled) Transfers: Sit to/from Stand Sit to Stand: Supervision         General transfer comment: Pt able to sit<>stand transfer from EOB using RW, required min VCs for appropriate hand placement  Ambulation/Gait Ambulation/Gait assistance: Supervision Gait Distance (Feet): 200 Feet Assistive device: Rolling walker (2 wheeled) Gait Pattern/deviations: Step-through pattern;Decreased step length - right;Decreased step length - left Gait velocity: decreased   General Gait Details: Pt able to ambulate with decreased velocity and 1 standing rest break (10 seconds) around nurses station and within room today with supervision using RW   Stairs             Wheelchair Mobility    Modified Rankin (Stroke Patients Only)       Balance Overall balance assessment: Needs assistance Sitting-balance support: Feet supported Sitting balance-Leahy Scale: Good Sitting balance - Comments: Able to complete static and dynamic seated exercises while maintaining stability safely   Standing balance support: Bilateral upper extremity supported;During functional activity Standing balance-Leahy Scale: Good Standing balance comment: Able to maintain standing balance safely standing EOB with RW for UE support  Cognition Arousal/Alertness: Awake/alert Behavior During Therapy: WFL for tasks assessed/performed Overall Cognitive Status: Within Functional Limits for tasks assessed                                  General Comments: Pt A&Ox4, pleasant and planning to discharge to SNF following hospital stay      Exercises Total Joint Exercises Ankle Circles/Pumps: AROM;Strengthening;Both;10 reps Heel Slides: AROM;Strengthening;Both;10 reps (VCs for form and pace) Marching in Standing: AROM;Strengthening;20 reps Standing Hip Extension: AROM;Both;10 reps (VCs for pace, pt tends to use momentum to complete motion)    General Comments        Pertinent Vitals/Pain Pain Assessment: 0-10 Pain Score: 1  Pain Location: R hip Pain Descriptors / Indicators: Sore    Home Living                      Prior Function            PT Goals (current goals can now be found in the care plan section) Acute Rehab PT Goals Patient Stated Goal: To get stronger and move RLE without pain PT Goal Formulation: With patient Time For Goal Achievement: 07/21/20 Potential to Achieve Goals: Good Progress towards PT goals: Progressing toward goals    Frequency    Min 2X/week      PT Plan Current plan remains appropriate    Co-evaluation              AM-PAC PT "6 Clicks" Mobility   Outcome Measure  Help needed turning from your back to your side while in a flat bed without using bedrails?: A Little Help needed moving from lying on your back to sitting on the side of a flat bed without using bedrails?: A Little Help needed moving to and from a bed to a chair (including a wheelchair)?: A Little Help needed standing up from a chair using your arms (e.g., wheelchair or bedside chair)?: A Little Help needed to walk in hospital room?: A Little Help needed climbing 3-5 steps with a railing? : A Little 6 Click Score: 18    End of Session Equipment Utilized During Treatment: Gait belt Activity Tolerance: Patient tolerated treatment well Patient left: with call bell/phone within reach;in bed;with bed alarm set Nurse Communication: Mobility status PT Visit Diagnosis: Difficulty in walking,  not elsewhere classified (R26.2);Muscle weakness (generalized) (M62.81);Pain Pain - Right/Left: Right Pain - part of body: Hip     Time: 6948-5462 PT Time Calculation (min) (ACUTE ONLY): 30 min  Charges:  $Gait Training: 8-22 mins $Therapeutic Exercise: 8-22 mins                     Duanne Guess, PT, DPT 07/13/20, 2:23 PM    Isaias Cowman 07/13/2020, 2:13 PM

## 2020-07-13 NOTE — NC FL2 (Signed)
[place] LEVEL OF CARE SCREENING TOOL     IDENTIFICATION  Patient Name: Amanda Bruce Birthdate: [birth year] Sex: female Admission Date (Current Location): 07/07/2020  [place] and [place] Number:  Engineering geologist and Address:  [facility], [address], [place], [place] [address]      Provider Number: [id number]  Attending Physician Name and Address:  Kayleen Memos, DO  Relative Name and Phone Number:  Malissia Rabbani [phone]    Current Level of Care: Hospital Recommended Level of Care: [place] [employer] Prior Approval Number:    Date Approved/Denied: 07/13/20 PASRR Number: [id number]  Discharge Plan: SNF    Current Diagnoses: Patient Active Problem List   Diagnosis Date Noted  . Chronic diastolic CHF (congestive heart failure) ([place]) 07/10/2020  . Acute diastolic CHF (congestive heart failure) ([place]) 07/10/2020  . Protein-calorie malnutrition, severe 07/10/2020  . Acute lower UTI 07/08/2020  . Right leg weakness 07/08/2020  . Ureteral obstruction, left 07/08/2020  . Cardiomegaly 07/08/2020  . Septic shock ([place]) 07/07/2020  . Cough 07/03/2020  . Memory deficit 06/14/2019  . Leg cramps, sleep related 06/12/2018  . Advanced care planning/counseling discussion 06/14/2017  . Hypercholesteremia 06/08/2016  . 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 Osteoporosis     Orientation RESPIRATION BLADDER Height & Weight     Self,Time,Situation,Place  Normal Continent Weight: 52.7 kg Height:  5\' 7"  (170.2 cm)  BEHAVIORAL SYMPTOMS/MOOD NEUROLOGICAL BOWEL NUTRITION STATUS      Continent Diet  AMBULATORY STATUS COMMUNICATION OF NEEDS Skin   Limited Assist Verbally Normal                       Personal Care Assistance Level of Assistance  Bathing,Dressing,Feeding Bathing Assistance: Limited assistance Feeding assistance: Limited assistance Dressing Assistance: Limited assistance     Functional  Limitations Info             SPECIAL CARE FACTORS FREQUENCY  PT (By licensed PT),OT (By licensed OT)     PT Frequency: 5 X weekly OT Frequency: 5 X weekly            Contractures Contractures Info: Not present    Additional Factors Info  Code Status Code Status Info: FULL             Current Medications (07/13/2020):  This is the current hospital active medication list Current Facility-Administered Medications  Medication Dose Route Frequency Provider Last Rate Last Admin  . acetaminophen (TYLENOL) tablet 650 mg  650 mg Oral Q6H PRN Mansy, Jan A, MD   650 mg at 07/12/20 2058   Or  . acetaminophen (TYLENOL) suppository 650 mg  650 mg Rectal Q6H PRN Mansy, Jan A, MD      . ascorbic acid (VITAMIN C) tablet 500 mg  500 mg Oral Daily Mansy, Jan A, MD   500 mg at 07/13/20 1114  . Chlorhexidine Gluconate Cloth 2 % PADS 6 each  6 each Topical Daily Annita Brod, MD   6 each at 07/12/20 [phone]  . enoxaparin (LOVENOX) injection 40 mg  40 mg Subcutaneous Q24H Dallie Piles, RPH   40 mg at 07/12/20 2054  . feeding supplement (ENSURE ENLIVE / ENSURE PLUS) liquid 237 mL  237 mL Oral BID BM Annita Brod, MD   237 mL at 07/13/20 1113  . furosemide (LASIX) injection 20 mg  20 mg Intravenous BID Annita Brod, MD   20 mg at 07/13/20 0830  .  magnesium hydroxide (MILK OF MAGNESIA) suspension 30 mL  30 mL Oral Daily PRN Mansy, Jan A, MD      . ondansetron [facility]) tablet 4 mg  4 mg Oral Q6H PRN Mansy, Jan A, MD       Or  . ondansetron [facility]) injection 4 mg  4 mg Intravenous Q6H PRN Mansy, Jan A, MD      . piperacillin-tazobactam (ZOSYN) IVPB 3.375 g  3.375 g Intravenous Q8H Dallie Piles, RPH 12.5 mL/hr at 07/13/20 0527 3.375 g at 07/13/20 0527  . polyethylene glycol (MIRALAX / GLYCOLAX) packet 17 g  17 g Oral Daily Annita Brod, MD   17 g at 07/13/20 1114  . potassium chloride SA (KLOR-CON) CR tablet 40 mEq  40 mEq Oral Daily Annita Brod, MD   40 mEq at  07/13/20 1114  . senna (SENOKOT) tablet 17.2 mg  2 tablet Oral Daily Irene Pap N, DO      . traZODone (DESYREL) tablet 25 mg  25 mg Oral QHS PRN Mansy, Jan A, MD   25 mg at 07/09/20 2115     Discharge Medications: Please see discharge summary for a list of discharge medications.  Relevant Imaging Results:  Relevant Lab Results:   Additional Information    Zigmund Daniel, Dorian Pod, RN

## 2020-07-13 NOTE — Progress Notes (Signed)
PROGRESS NOTE  Amanda Bruce VHQ:[id number] DOB: [birth year] DOA: 07/07/2020 PCP: Valerie Roys, DO  HPI/Recap of past 81 hours: 80 year old female with past medical history only of kidney stones presented to the emergency room on 1/10 with several days of low back pain plus fever and nausea that had started that afternoon.  In the emergency room, work-up revealed patient was in septic shock secondary to UTI which have been caused by left-sided kidney stones that had nearly completely obstructed her left kidney.  Urology was consulted and patient was started on IV antibiotics and fluids.  Patient was taken for urgent cystoscopy with left ureteral stent placement.  Because of her sepsis, stone was not removed at that time.  Following stent placement, patient moved to hospital room.  Since that time, complaining of significant right thigh pain limiting ability to raise that leg.  No neurological findings and CK elevated mildly.  Sepsis since resolved.  BNP elevated at 430's echocardiogram done noting mild diastolic dysfunction.  Started on Lasix.  Antibiotics changed to Zosyn.  Right lower extremity Doppler ruled out DVT.  Continues to improve.  White count and procalcitonin now both trending downward.  Has diuresed almost 10 L of fluid.  Continues to have right thigh pain which has not improved in severity.  07/13/20: Patient was seen and examined at bedside.  They were no acute events overnight.  She reports constipation, last bowel movement was 2 days ago.  Added bowel regimen.  Assessment/Plan: Principal Problem:   Septic shock (HCC) Active Problems:   Hypercholesteremia   Acute lower UTI   Right leg weakness   Ureteral obstruction, left   Cardiomegaly   Acute diastolic CHF (congestive heart failure) (HCC)   Protein-calorie malnutrition, severe  Severe sepsis, resolving, ([place]) secondary to [facility] UTI:  Presented with leukocytosis, peaked at 20,000, procalcitonin greater than 150,  tachycardia with heart rate of 119, hypotension requiring IV fluid boluses.  She did not receive vasopressors. Urine analysis and urine culture positive for greater than 100,000 colonies of E. coli.   Urinary tract infection likely caused by left ureteral obstruction from kidney stone.  Status post left ureteral stent placement.   She is currently on IV Zosyn, continue.   Procalcitonin continues to downtrend, 6 from 150.  Urology recommended outpatient follow-up in 2 to 3 weeks from 07/08/2020 for outpatient ureteroscopy with laser lithotripsy and stent exchange for definitive stone management with  Dr. Bernardo Heater.  Resolved refractory Hypokalemia K+ 3.1> 3.5 Repleted orally 40 mEq daily, continue. Added 1 dose IV mag 2 g x1 on 07/12/2020. Repeat BMP and mag level in the AM  Improving right leg weakness/pain:   CPK leval has normalized   Neurological exam unremarkable.   Pain improving DVT ruled out by Doppler.   MRI of femur with and without contrast: non specific findings.  Resolved AKI: She is back to her baseline Cr Continue to monitor UO Continue to avoid nephrotoxins, dehydration and hypotension  Acute diastolic heart failure:  Echocardiogram notes grade 1 diastolic dysfunction.   Responding very well to IV Lasix.   Net I&O -5.5L  Resolved Leukocytosis:  Also afebrile Continue to monitor.  Ambulatory dysfunction PT assessed and recommended SNF TOC consulted to assist with SNF placement.  Constipation Senokot 2 tablets daily added.     Severe protein calorie malnutrition: Nutrition Status: Nutrition Problem: Severe Malnutrition Etiology: social / environmental circumstances Signs/Symptoms: moderate fat depletion,severe fat depletion,moderate muscle depletion,severe muscle depletion,percent weight loss Percent weight loss: 7.6 % (  9 lb decrease from minimum reported usual weight) Interventions: MVI,Ensure Enlive (each supplement provides 350kcal and 20 grams of  protein),Magic cup,Education,[employer] Instant Breakfast        Code Status: Full code  Family Communication: Updated her son via phone on 07/12/20  Disposition Plan: Likely dc to SNF   Consultants:  Urology  Procedures:  2D echo   Antimicrobials:  Zosyn  DVT prophylaxis:  SQ Lovenox daily  Status is: Inpatient    Dispo: The patient is from:Home              Anticipated d/c is to: SNF              Anticipated d/c date is: 07/15/20              Patient currently not stable due to ongoing treatment for severe sepsis 2/2 to ecoli UTI.       Objective: Vitals:   07/13/20 0500 07/13/20 0533 07/13/20 0820 07/13/20 0837  BP:  133/76 134/72   Pulse:  83 80 77  Resp:  16 16 15   Temp:   98.2 F (36.8 C) 97.7 F (36.5 C)  TempSrc:   Oral Oral  SpO2:  95% 95% 92%  Weight: 52.7 kg     Height:        Intake/Output Summary (Last 24 hours) at 07/13/2020 1049 Last data filed at 07/13/2020 0900 Gross per 24 hour  Intake 698.36 ml  Output 900 ml  Net -201.64 ml   Filed Weights   07/11/20 0500 07/12/20 0500 07/13/20 0500  Weight: 55.6 kg 55.1 kg 52.7 kg    Exam:  . General: 80 y.o. year-old female pleasant, well-developed well-nourished, in no acute distress.  Alert and oriented x3.  . Cardiovascular: Regular rate and rhythm with no rubs or gallops.  Marland Kitchen Respiratory: Clear to auscultation no wheezes or rales. . Abdomen: Soft nontender normal bowel sounds present. . Musculoskeletal: No lower extremity edema bilaterally.   . Skin: No ulcerative lesions noted. Marland Kitchen Psychiatry: Mood is appropriate to condition and setting   Data Reviewed: CBC: Recent Labs  Lab 07/09/20 0356 07/10/20 0310 07/11/20 0426 07/12/20 0512 07/13/20 0446  WBC 18.6* 20.8* 15.9* 8.4 7.5  HGB 11.1* 10.6* 11.8* 12.9 13.2  HCT 33.8* 31.6* 34.5* 37.6 39.6  MCV 96.3 93.5 92.5 92.2 93.6  PLT 124* 142* 195 233 [id number]   Basic Metabolic Panel: Recent Labs  Lab 07/09/20 0356 07/10/20 0310  07/11/20 0426 07/12/20 0512 07/13/20 0446  NA 135 136 138 138 138  K 4.8 3.0* 3.3* 3.1* 3.5  CL 103 102 98 98 97*  CO2 24 25 30 28 29   GLUCOSE 85 100* 92 112* 107*  BUN 42* 26* 19 27* 38*  CREATININE 1.27* 0.69 0.67 0.65 0.65  CALCIUM 7.8* 8.3* 8.7* 9.0 9.0   GFR: Estimated Creatinine Clearance: 47.4 mL/min (by C-G formula based on SCr of 0.65 mg/dL). Liver Function Tests: Recent Labs  Lab 07/07/20 1933 07/09/20 0356  AST 159* 82*  ALT 62* 70*  ALKPHOS 134* 108  BILITOT 1.2 0.6  PROT 7.3 5.9*  ALBUMIN 4.0 2.9*   Recent Labs  Lab 07/07/20 1933  LIPASE 25   No results for input(s): AMMONIA in the last 168 hours. Coagulation Profile: Recent Labs  Lab 07/08/20 0544  INR 1.1   Cardiac Enzymes: Recent Labs  Lab 07/08/20 1459 07/10/20 0310  CKTOTAL 342* 127   BNP (last 3 results) No results for input(s): PROBNP in the last 8760  hours. HbA1C: No results for input(s): HGBA1C in the last 72 hours. CBG: No results for input(s): GLUCAP in the last 168 hours. Lipid Profile: No results for input(s): CHOL, HDL, LDLCALC, TRIG, CHOLHDL, LDLDIRECT in the last 72 hours. Thyroid Function Tests: No results for input(s): TSH, T4TOTAL, FREET4, T3FREE, THYROIDAB in the last 72 hours. Anemia Panel: No results for input(s): VITAMINB12, FOLATE, FERRITIN, TIBC, IRON, RETICCTPCT in the last 72 hours. Urine analysis:    Component Value Date/Time   COLORURINE YELLOW (A) 07/07/2020 1933   APPEARANCEUR CLOUDY (A) 07/07/2020 1933   APPEARANCEUR Clear 06/14/2019 0947   LABSPEC 1.011 07/07/2020 1933   PHURINE 5.0 07/07/2020 1933   GLUCOSEU NEGATIVE 07/07/2020 1933   HGBUR MODERATE (A) 07/07/2020 1933   BILIRUBINUR NEGATIVE 07/07/2020 1933   BILIRUBINUR Negative 06/14/2019 [place] 07/07/2020 1933   PROTEINUR 30 (A) 07/07/2020 1933   NITRITE POSITIVE (A) 07/07/2020 1933   LEUKOCYTESUR TRACE (A) 07/07/2020 1933   Sepsis  Labs: @LABRCNTIP (procalcitonin:4,lacticidven:4)  ) Recent Results (from the past 240 hour(s))  Urine culture     Status: Abnormal   Collection Time: 07/07/20  7:33 PM   Specimen: Urine, Random  Result Value Ref Range Status   Specimen Description   Final    URINE, RANDOM Performed at [facility], [address], [place], [place] [address]    Special Requests   Final    NONE Performed at [facility], [address] [place],  [address]    Culture >=100,000 COLONIES/mL ESCHERICHIA COLI (A)  Final   Report Status 07/10/2020 FINAL  Final   Organism ID, Bacteria ESCHERICHIA COLI (A)  Final      Susceptibility   Escherichia coli - MIC*    AMPICILLIN >=32 RESISTANT Resistant     CEFAZOLIN <=4 SENSITIVE Sensitive     CEFEPIME <=0.12 SENSITIVE Sensitive     CEFTRIAXONE <=0.25 SENSITIVE Sensitive     CIPROFLOXACIN >=4 RESISTANT Resistant     GENTAMICIN <=1 SENSITIVE Sensitive     IMIPENEM <=0.25 SENSITIVE Sensitive     NITROFURANTOIN <=16 SENSITIVE Sensitive     TRIMETH/SULFA <=20 SENSITIVE Sensitive     AMPICILLIN/SULBACTAM >=32 RESISTANT Resistant     PIP/TAZO <=4 SENSITIVE Sensitive     * >=100,000 COLONIES/mL ESCHERICHIA COLI  Resp Panel by RT-PCR (Flu A&B, Covid) Nasopharyngeal Swab     Status: None   Collection Time: 07/07/20 10:56 PM   Specimen: Nasopharyngeal Swab; Nasopharyngeal(NP) swabs in vial transport medium  Result Value Ref Range Status   SARS Coronavirus 2 by RT PCR NEGATIVE NEGATIVE Final    Comment: (NOTE) SARS-CoV-2 target nucleic acids are NOT DETECTED.  The SARS-CoV-2 RNA is generally detectable in upper respiratory specimens during the acute phase of infection. The lowest concentration of SARS-CoV-2 viral copies this assay can detect is 138 copies/mL. A negative result does not preclude SARS-Cov-2 infection and should not be used as the sole basis for treatment or other patient management decisions. A negative result may occur  with  improper specimen collection/handling, submission of specimen other than nasopharyngeal swab, presence of viral mutation(s) within the areas targeted by this assay, and inadequate number of viral copies(<138 copies/mL). A negative result must be combined with clinical observations, patient history, and epidemiological information. The expected result is Negative.  Fact Sheet for Patients:  EntrepreneurPulse.com.au  Fact Sheet for Healthcare Providers:  IncredibleEmployment.be  This test is no t yet approved or cleared by the [place] FDA and  has  been authorized for detection and/or diagnosis of SARS-CoV-2 by FDA under an Emergency Use Authorization (EUA). This EUA will remain  in effect (meaning this test can be used) for the duration of the COVID-19 declaration under Section 564(b)(1) of the Act, 21 U.S.C.section 360bbb-3(b)(1), unless the authorization is terminated  or revoked sooner.       Influenza A by PCR NEGATIVE NEGATIVE Final   Influenza B by PCR NEGATIVE NEGATIVE Final    Comment: (NOTE) The Xpert Xpress SARS-CoV-2/FLU/RSV plus assay is intended as an aid in the diagnosis of influenza from Nasopharyngeal swab specimens and should not be used as a sole basis for treatment. Nasal washings and aspirates are unacceptable for Xpert Xpress SARS-CoV-2/FLU/RSV testing.  Fact Sheet for Patients: EntrepreneurPulse.com.au  Fact Sheet for Healthcare Providers: IncredibleEmployment.be  This test is not yet approved or cleared by the [place] FDA and has been authorized for detection and/or diagnosis of SARS-CoV-2 by FDA under an Emergency Use Authorization (EUA). This EUA will remain in effect (meaning this test can be used) for the duration of the COVID-19 declaration under Section 564(b)(1) of the Act, 21 U.S.C. section 360bbb-3(b)(1), unless the authorization is terminated  or revoked.  Performed at [facility], [address] [place], [place] [address]   Urine Culture     Status: Abnormal   Collection Time: 07/08/20 12:59 AM   Specimen: PATH Other; Urine  Result Value Ref Range Status   Specimen Description   Final    URINE, RANDOM Performed at [facility], [address] [place], [place] [address]    Special Requests   Final    LEFT RENAL PELVIS Performed at [facility], [place], [place] [address]    Culture >=100,000 COLONIES/mL ESCHERICHIA COLI (A)  Final   Report Status 07/10/2020 FINAL  Final   Organism ID, Bacteria ESCHERICHIA COLI (A)  Final      Susceptibility   Escherichia coli - MIC*    AMPICILLIN >=32 RESISTANT Resistant     CEFAZOLIN <=4 SENSITIVE Sensitive     CEFEPIME <=0.12 SENSITIVE Sensitive     CEFTRIAXONE <=0.25 SENSITIVE Sensitive     CIPROFLOXACIN >=4 RESISTANT Resistant     GENTAMICIN <=1 SENSITIVE Sensitive     IMIPENEM <=0.25 SENSITIVE Sensitive     NITROFURANTOIN <=16 SENSITIVE Sensitive     TRIMETH/SULFA <=20 SENSITIVE Sensitive     AMPICILLIN/SULBACTAM >=32 RESISTANT Resistant     PIP/TAZO <=4 SENSITIVE Sensitive     * >=100,000 COLONIES/mL ESCHERICHIA COLI      Studies: No results found.  Scheduled Meds: . ascorbic acid  500 mg Oral Daily  . Chlorhexidine Gluconate Cloth  6 each Topical Daily  . enoxaparin (LOVENOX) injection  40 mg Subcutaneous Q24H  . feeding supplement  237 mL Oral BID BM  . furosemide  20 mg Intravenous BID  . polyethylene glycol  17 g Oral Daily  . potassium chloride  40 mEq Oral Daily  . senna  2 tablet Oral Daily    Continuous Infusions: . piperacillin-tazobactam (ZOSYN)  IV 3.375 g (07/13/20 0527)     LOS: 6 days     Kayleen Memos, MD [facility] Pager [phone]  If 7PM-7AM, please contact night-coverage [email or web address] Password [facility] 07/13/2020, 10:49 AM

## 2020-07-13 NOTE — TOC Progression Note (Signed)
Transition of Care [facility]) - Progression Note    Patient Details  Name: Amanda Bruce MRN: [id number] Date of Birth: 1941-01-08  Transition of Care [facility]) CM/SW Contact  Zigmund Daniel Dorian Pod, RN Phone Number:[phone] 07/13/2020, 12:01 PM  Clinical Narrative:    Spoke with pt earlier this morning who agreed on SNF placement instead of [facility]. Later in the day received a message to call the son. Spoke with Ron concerning all inquires and questions related to SNF placement. RN explained the process and services that would be recommended from the facility. Also discussed possible [facility] once pt has received SNF services if pt continue to need therapy. Provided choices on local SNF as son agreed to all except two locations. Will update Dr. Nevada Crane and team accordingly.  Completed PASRR and FL2 submitted to facilities. Current awaiting bed offer for placement. No other inquires or request at this time.   TOC team will remain available if needed.    Expected Discharge Plan: [place] Barriers to Discharge: Continued Medical Work up  Expected Discharge Plan and Services Expected Discharge Plan: [place]   Discharge Planning Services: CM Consult   Living arrangements for the past 2 months: [place] Arranged: RN,PT,OT,Nurse's Aide [place]: [place] ([place]) Date [place]: 07/09/20   Representative spoke with at [place]: [place] ([place]) Interventions    Readmission Risk Interventions No flowsheet data found.

## 2020-07-14 DIAGNOSIS — R6521 Severe sepsis with septic shock: Secondary | ICD-10-CM | POA: Diagnosis not present

## 2020-07-14 DIAGNOSIS — A419 Sepsis, unspecified organism: Secondary | ICD-10-CM | POA: Diagnosis not present

## 2020-07-14 LAB — BASIC METABOLIC PANEL
Anion gap: 9 (ref 5–15)
BUN: 38 mg/dL — ABNORMAL HIGH (ref 8–23)
CO2: 31 mmol/L (ref 22–32)
Calcium: 8.9 mg/dL (ref 8.9–10.3)
Chloride: 98 mmol/L (ref 98–111)
Creatinine, Ser: 0.62 mg/dL (ref 0.44–1.00)
GFR, Estimated: >60 mL/min (ref >60)
Glucose, Bld: 85 mg/dL (ref 70–99)
Potassium: 3.4 mmol/L — ABNORMAL LOW (ref 3.5–5.1)
Sodium: 138 mmol/L (ref 135–145)

## 2020-07-14 LAB — CBC
HCT: 38.7 % (ref 36.0–46.0)
Hemoglobin: 12.8 g/dL (ref 12.0–15.0)
MCH: 31.2 pg (ref 26.0–34.0)
MCHC: 33.1 g/dL (ref 30.0–36.0)
MCV: 94.4 fL (ref 80.0–100.0)
Platelets: 329 10*3/uL (ref 150–400)
RBC: 4.1 MIL/uL (ref 3.87–5.11)
RDW: 12.3 % (ref 11.5–15.5)
WBC: 9 10*3/uL (ref 4.0–10.5)
nRBC: 0 % (ref 0.0–0.2)

## 2020-07-14 MED ORDER — POTASSIUM CHLORIDE CRYS ER 20 MEQ PO TBCR
40.0000 meq | EXTENDED_RELEASE_TABLET | Freq: Once | ORAL | Status: AC
  Administered 2020-07-14: 40 meq via ORAL
  Filled 2020-07-14: qty 2

## 2020-07-14 MED ORDER — AMOXICILLIN-POT CLAVULANATE 875-125 MG PO TABS: 1.0000 | ORAL_TABLET | Freq: Two times a day (BID) | ORAL | Status: DC

## 2020-07-14 NOTE — Progress Notes (Addendum)
PROGRESS NOTE  GULIANNA HORNSBY VOJ:[id number] DOB: 1941/06/04 DOA: 07/07/2020 PCP: Valerie Roys, DO  HPI/Recap of past 24 hours: 80 year old female with past medical history only of kidney stones presented from home to the emergency room on 07/07/20 with several days of low back pain plus fever and nausea that had started that afternoon.  In the emergency room, work-up revealed patient was in septic shock secondary to UTI likely caused by left-sided kidney stones that had nearly completely obstructed her left kidney.  Urology was consulted and patient was started on IV antibiotics and fluids.  Patient was taken for urgent cystoscopy with left ureteral stent placement.  Because of her sepsis, stone was not removed at that time.  Following stent placement, patient moved to hospital room.  Since that time, she has complained of significant right thigh pain limiting ability to raise that leg but improving.  No neurological findings and CK normalized, initially mildly elevated.  She is on Zosyn, sepsis has  resolved.  Urology recommended 14 days of culture appropriate course of antibiotics and outpatient follow-up in 2 to 3 weeks from 07/08/2020 for outpatient ureteroscopy with laser lithotripsy and stent exchange for definitive stone management with  Dr. Bernardo Heater.  07/14/20: Patient was seen and examined at bedside this morning.  No acute events overnight.  She had no new complaints.  She was seen by PT with recommendation for SNF.  Awaiting SNF placement.  Assessment/Plan: Principal Problem:   Septic shock (HCC) Active Problems:   Hypercholesteremia   Acute lower UTI   Right leg weakness   Ureteral obstruction, left   Cardiomegaly   Acute diastolic CHF (congestive heart failure) (HCC)   Protein-calorie malnutrition, severe  Severe sepsis, resolved, ([place]) secondary to [facility] UTI:  Presented with leukocytosis, peaked at 20,000, procalcitonin greater than 150, tachycardia with heart rate of  119, hypotension requiring IV fluid boluses.  She did not receive vasopressors. Urine analysis and urine culture positive for greater than 100,000 colonies of E. coli.   Urinary tract infection likely caused by left ureteral obstruction from kidney stone.  Status post left ureteral stent placement.   She is currently on IV Zosyn day#6, switch to Augmentin on 07/15/20 to complete a 14 day course.   Procalcitonin continues to downtrend, 6 from 150.  Repeat procalcitonin in the AM Urology recommended outpatient follow-up in 2 to 3 weeks from 07/08/2020 for outpatient ureteroscopy with laser lithotripsy and stent exchange for definitive stone management with  Dr. Bernardo Heater.  Acute urinary retention She has a Foley catheter in place We will DC the Foley and do a voiding trial.  Refractory Hypokalemia K+ 3.1> 3.5> 3.4 Repleted orally 40 mEq daily, continue. Magnesium 2.4  Improving right leg weakness/pain:   CPK leval has normalized   Neurological exam unremarkable.   Pain improving DVT ruled out by Doppler.   MRI of femur with and without contrast: non specific findings. Continue PT  Resolved AKI: She is back to her baseline Cr Continue to monitor UO Continue to avoid nephrotoxins, dehydration and hypotension  Acute diastolic heart failure:  Echocardiogram notes grade 1 diastolic dysfunction.   Responding very well to IV Lasix.   Net I&O -7.6 L  Resolved Leukocytosis:  Afebrile Continue to monitor.  Ambulatory dysfunction PT assessed and recommended SNF TOC consulted to assist with SNF placement. Awaiting placement  Constipation Senokot 2 tablets daily added. Continue bowel regimen    Severe protein calorie malnutrition: Nutrition Status: Nutrition Problem: Severe Malnutrition Etiology: social /  environmental circumstances Signs/Symptoms: moderate fat depletion,severe fat depletion,moderate muscle depletion,severe muscle depletion,percent weight loss Percent weight  loss: 7.6 % (9 lb decrease from minimum reported usual weight) Interventions: MVI,Ensure Enlive (each supplement provides 350kcal and 20 grams of protein),Magic cup,Education,Carnation Instant Breakfast        Code Status: Full code  Family Communication: Updated her son via phone on 07/12/20  Disposition Plan: Likely dc to SNF   Consultants:  Urology  Procedures:  2D echo   Antimicrobials:  Zosyn  DVT prophylaxis:  SQ Lovenox daily  Status is: Inpatient    Dispo: The patient is from:Home              Anticipated d/c is to: SNF              Anticipated d/c date is: 07/15/20              Patient currently not stable due to ongoing treatment for severe sepsis 2/2 to ecoli UTI.       Objective: Vitals:   07/14/20 0452 07/14/20 0504 07/14/20 0848 07/14/20 1123  BP: 138/74 139/74 119/71 114/87  Pulse: 91 87 78 94  Resp: 20 17 16 16   Temp: 98.4 F (36.9 C) 98.3 F (36.8 C) 98 F (36.7 C) 98.6 F (37 C)  TempSrc: Oral Oral    SpO2: 97% 95% 96% 99%  Weight:      Height:        Intake/Output Summary (Last 24 hours) at 07/14/2020 1417 Last data filed at 07/14/2020 1300 Gross per 24 hour  Intake 287.1 ml  Output 2650 ml  Net -2362.9 ml   Filed Weights   07/11/20 0500 07/12/20 0500 07/13/20 0500  Weight: 55.6 kg 55.1 kg 52.7 kg    Exam:  . General: 80 y.o. year-old female Pleasant well-developed well-nourished alert and oriented x3. . Cardiovascular: Regular rate and rhythm no rubs or gallops. Marland Kitchen Respiratory: Clear to auscultation no wheezes or rales.   . Abdomen: Soft nontender normal bowel sounds. . Musculoskeletal: No lower extremity edema bilaterally  . skin: No ulcerative lesions noted . Psychiatry: Mood is appropriate.   Data Reviewed: CBC: Recent Labs  Lab 07/10/20 0310 07/11/20 0426 07/12/20 0512 07/13/20 0446 07/14/20 0500  WBC 20.8* 15.9* 8.4 7.5 9.0  HGB 10.6* 11.8* 12.9 13.2 12.8  HCT 31.6* 34.5* 37.6 39.6 38.7  MCV 93.5  92.5 92.2 93.6 94.4  PLT 142* 195 233 287 [id number]   Basic Metabolic Panel: Recent Labs  Lab 07/10/20 0310 07/11/20 0426 07/12/20 0512 07/13/20 0446 07/14/20 0500  NA 136 138 138 138 138  K 3.0* 3.3* 3.1* 3.5 3.4*  CL 102 98 98 97* 98  CO2 25 30 28 29 31   GLUCOSE 100* 92 112* 107* 85  BUN 26* 19 27* 38* 38*  CREATININE 0.69 0.67 0.65 0.65 0.62  CALCIUM 8.3* 8.7* 9.0 9.0 8.9  MG  --   --   --  2.4  --    GFR: Estimated Creatinine Clearance: 47.4 mL/min (by C-G formula based on SCr of 0.62 mg/dL). Liver Function Tests: Recent Labs  Lab 07/07/20 1933 07/09/20 0356  AST 159* 82*  ALT 62* 70*  ALKPHOS 134* 108  BILITOT 1.2 0.6  PROT 7.3 5.9*  ALBUMIN 4.0 2.9*   Recent Labs  Lab 07/07/20 1933  LIPASE 25   No results for input(s): AMMONIA in the last 168 hours. Coagulation Profile: Recent Labs  Lab 07/08/20 0544  INR 1.1   Cardiac Enzymes:  Recent Labs  Lab 07/08/20 1459 07/10/20 0310  CKTOTAL 342* 127   BNP (last 3 results) No results for input(s): PROBNP in the last 8760 hours. HbA1C: No results for input(s): HGBA1C in the last 72 hours. CBG: No results for input(s): GLUCAP in the last 168 hours. Lipid Profile: No results for input(s): CHOL, HDL, LDLCALC, TRIG, CHOLHDL, LDLDIRECT in the last 72 hours. Thyroid Function Tests: No results for input(s): TSH, T4TOTAL, FREET4, T3FREE, THYROIDAB in the last 72 hours. Anemia Panel: No results for input(s): VITAMINB12, FOLATE, FERRITIN, TIBC, IRON, RETICCTPCT in the last 72 hours. Urine analysis:    Component Value Date/Time   COLORURINE YELLOW (A) 07/07/2020 1933   APPEARANCEUR CLOUDY (A) 07/07/2020 1933   APPEARANCEUR Clear 06/14/2019 0947   LABSPEC 1.011 07/07/2020 1933   PHURINE 5.0 07/07/2020 1933   GLUCOSEU NEGATIVE 07/07/2020 1933   HGBUR MODERATE (A) 07/07/2020 1933   BILIRUBINUR NEGATIVE 07/07/2020 1933   BILIRUBINUR Negative 06/14/2019 [place] 07/07/2020 1933   PROTEINUR 30 (A)  07/07/2020 1933   NITRITE POSITIVE (A) 07/07/2020 1933   LEUKOCYTESUR TRACE (A) 07/07/2020 1933   Sepsis Labs: @LABRCNTIP (procalcitonin:4,lacticidven:4)  ) Recent Results (from the past 240 hour(s))  Urine culture     Status: Abnormal   Collection Time: 07/07/20  7:33 PM   Specimen: Urine, Random  Result Value Ref Range Status   Specimen Description   Final    URINE, RANDOM Performed at [facility], [address] [place], [place] [address]    Special Requests   Final    NONE Performed at [facility], [address] [place], [place] [address]    Culture >=100,000 COLONIES/mL ESCHERICHIA COLI (A)  Final   Report Status 07/10/2020 FINAL  Final   Organism ID, Bacteria ESCHERICHIA COLI (A)  Final      Susceptibility   Escherichia coli - MIC*    AMPICILLIN >=32 RESISTANT Resistant     CEFAZOLIN <=4 SENSITIVE Sensitive     CEFEPIME <=0.12 SENSITIVE Sensitive     CEFTRIAXONE <=0.25 SENSITIVE Sensitive     CIPROFLOXACIN >=4 RESISTANT Resistant     GENTAMICIN <=1 SENSITIVE Sensitive     IMIPENEM <=0.25 SENSITIVE Sensitive     NITROFURANTOIN <=16 SENSITIVE Sensitive     TRIMETH/SULFA <=20 SENSITIVE Sensitive     AMPICILLIN/SULBACTAM >=32 RESISTANT Resistant     PIP/TAZO <=4 SENSITIVE Sensitive     * >=100,000 COLONIES/mL ESCHERICHIA COLI  Resp Panel by RT-PCR (Flu A&B, Covid) Nasopharyngeal Swab     Status: None   Collection Time: 07/07/20 10:56 PM   Specimen: Nasopharyngeal Swab; Nasopharyngeal(NP) swabs in vial transport medium  Result Value Ref Range Status   SARS Coronavirus 2 by RT PCR NEGATIVE NEGATIVE Final    Comment: (NOTE) SARS-CoV-2 target nucleic acids are NOT DETECTED.  The SARS-CoV-2 RNA is generally detectable in upper respiratory specimens during the acute phase of infection. The lowest concentration of SARS-CoV-2 viral copies this assay can detect is 138 copies/mL. A negative result does not preclude SARS-Cov-2 infection and should not  be used as the sole basis for treatment or other patient management decisions. A negative result may occur with  improper specimen collection/handling, submission of specimen other than nasopharyngeal swab, presence of viral mutation(s) within the areas targeted by this assay, and inadequate number of viral copies(<138 copies/mL). A negative result must be combined with clinical observations, patient history, and epidemiological information. The expected result is Negative.  Fact Sheet for Patients:  EntrepreneurPulse.com.au  Fact Sheet for Healthcare Providers:  IncredibleEmployment.be  This test is no t yet approved or cleared by the [place] FDA and  has been authorized for detection and/or diagnosis of SARS-CoV-2 by FDA under an Emergency Use Authorization (EUA). This EUA will remain  in effect (meaning this test can be used) for the duration of the COVID-19 declaration under Section 564(b)(1) of the Act, 21 U.S.C.section 360bbb-3(b)(1), unless the authorization is terminated  or revoked sooner.       Influenza A by PCR NEGATIVE NEGATIVE Final   Influenza B by PCR NEGATIVE NEGATIVE Final    Comment: (NOTE) The Xpert Xpress SARS-CoV-2/FLU/RSV plus assay is intended as an aid in the diagnosis of influenza from Nasopharyngeal swab specimens and should not be used as a sole basis for treatment. Nasal washings and aspirates are unacceptable for Xpert Xpress SARS-CoV-2/FLU/RSV testing.  Fact Sheet for Patients: EntrepreneurPulse.com.au  Fact Sheet for Healthcare Providers: IncredibleEmployment.be  This test is not yet approved or cleared by the [place] FDA and has been authorized for detection and/or diagnosis of SARS-CoV-2 by FDA under an Emergency Use Authorization (EUA). This EUA will remain in effect (meaning this test can be used) for the duration of the COVID-19 declaration under Section  564(b)(1) of the Act, 21 U.S.C. section 360bbb-3(b)(1), unless the authorization is terminated or revoked.  Performed at [facility], [address] [place], [place] [address]   Urine Culture     Status: Abnormal   Collection Time: 07/08/20 12:59 AM   Specimen: PATH Other; Urine  Result Value Ref Range Status   Specimen Description   Final    URINE, RANDOM Performed at [facility], [address] [place], [place] [address]    Special Requests   Final    LEFT RENAL PELVIS Performed at [facility], [place], [place] [address]    Culture >=100,000 COLONIES/mL ESCHERICHIA COLI (A)  Final   Report Status 07/10/2020 FINAL  Final   Organism ID, Bacteria ESCHERICHIA COLI (A)  Final      Susceptibility   Escherichia coli - MIC*    AMPICILLIN >=32 RESISTANT Resistant     CEFAZOLIN <=4 SENSITIVE Sensitive     CEFEPIME <=0.12 SENSITIVE Sensitive     CEFTRIAXONE <=0.25 SENSITIVE Sensitive     CIPROFLOXACIN >=4 RESISTANT Resistant     GENTAMICIN <=1 SENSITIVE Sensitive     IMIPENEM <=0.25 SENSITIVE Sensitive     NITROFURANTOIN <=16 SENSITIVE Sensitive     TRIMETH/SULFA <=20 SENSITIVE Sensitive     AMPICILLIN/SULBACTAM >=32 RESISTANT Resistant     PIP/TAZO <=4 SENSITIVE Sensitive     * >=100,000 COLONIES/mL ESCHERICHIA COLI      Studies: No results found.  Scheduled Meds: . ascorbic acid  500 mg Oral Daily  . enoxaparin (LOVENOX) injection  40 mg Subcutaneous Q24H  . feeding supplement  237 mL Oral BID BM  . furosemide  20 mg Intravenous BID  . polyethylene glycol  17 g Oral Daily  . potassium chloride  40 mEq Oral Daily  . senna  2 tablet Oral Daily    Continuous Infusions: . piperacillin-tazobactam (ZOSYN)  IV 3.375 g (07/14/20 1313)     LOS: 7 days     Kayleen Memos, MD [facility] Pager [phone]  If 7PM-7AM, please contact night-coverage [email or web address] Password [email or web address] 07/14/2020, 2:17 PM

## 2020-07-14 NOTE — Progress Notes (Signed)
Mobility Specialist - Progress Note   07/14/20 1600  Mobility  Activity Ambulated in hall  Level of Assistance Modified independent, requires aide device or extra time  Assistive Device None (Pt pushed IV pole)  Distance Ambulated (ft) 400 ft  Mobility Response Tolerated well  Mobility performed by Mobility specialist  $Mobility charge 1 Mobility    Pre-mobility: 99 HR, 94% SpO2 During mobility: 128 HR, 94% SpO2 Post-mobility: 110 HR, 96% SpO2   Pt was lying in bed upon arrival utilizing room air. Pt agreed to session. Pt denied pain, nausea, and fatigue this date. Pt refused use of AD but agreed to utilize IV pole for ambulation. Pt was modI in all transfers this date, including ambulation. Pt used restroom prior to further activity, performing peri-care and hygiene tasks without assistance. Pt continued to ambulate 360' in hallway with no LOB noted. Pt denied fatigue, but did c/o "a little weakness". Pt also c/o mild SOB, O2 > 93% throughout session. Upon return to room, pt c/o mild exhaustion from activity. Pt was able to return supine with supervision. Overall, pt tolerated session well. Pt was left in bed with all needs in reach.    Kathee Delton Mobility Specialist 07/14/20, 4:19 PM

## 2020-07-14 NOTE — Care Management Important Message (Signed)
Important Message  Patient Details  Name: Amanda Bruce MRN: [id number] Date of Birth: 1940/08/17   Medicare Important Message Given:  Yes     [name] 07/14/2020, 12:02 PM

## 2020-07-14 NOTE — TOC Progression Note (Signed)
Transition of Care [facility]) - Progression Note    Patient Details  Name: Amanda Bruce MRN: [id number] Date of Birth: 12-19-1940  Transition of Care [facility]) CM/SW Contact  Eileen Stanford, LCSW Phone Number: 07/14/2020, 2:50 PM  Clinical Narrative:  Pt has no bed offers at this time.     Expected Discharge Plan: [place] Barriers to Discharge: Continued Medical Work up  Expected Discharge Plan and Services Expected Discharge Plan: [place]   Discharge Planning Services: CM Consult   Living arrangements for the past 2 months: [place] Arranged: RN,PT,OT,Nurse's Aide [place]: [place] ([place]) Date [place]: 07/09/20   Representative spoke with at [place]: [place] ([place]) Interventions    Readmission Risk Interventions No flowsheet data found.

## 2020-07-15 LAB — BASIC METABOLIC PANEL
Anion gap: 10 (ref 5–15)
BUN: 41 mg/dL — ABNORMAL HIGH (ref 8–23)
CO2: 30 mmol/L (ref 22–32)
Calcium: 9.1 mg/dL (ref 8.9–10.3)
Chloride: 96 mmol/L — ABNORMAL LOW (ref 98–111)
Creatinine, Ser: 0.7 mg/dL (ref 0.44–1.00)
GFR, Estimated: >60 mL/min (ref >60)
Glucose, Bld: 88 mg/dL (ref 70–99)
Potassium: 3.8 mmol/L (ref 3.5–5.1)
Sodium: 136 mmol/L (ref 135–145)

## 2020-07-15 LAB — CBC
HCT: 38.1 % (ref 36.0–46.0)
Hemoglobin: 13 g/dL (ref 12.0–15.0)
MCH: 32.4 pg (ref 26.0–34.0)
MCHC: 34.1 g/dL (ref 30.0–36.0)
MCV: 95 fL (ref 80.0–100.0)
Platelets: 375 10*3/uL (ref 150–400)
RBC: 4.01 MIL/uL (ref 3.87–5.11)
RDW: 12.1 % (ref 11.5–15.5)
WBC: 8.3 10*3/uL (ref 4.0–10.5)
nRBC: 0 % (ref 0.0–0.2)

## 2020-07-15 LAB — PROCALCITONIN: Procalcitonin: 2.06 ng/mL

## 2020-07-15 LAB — MAGNESIUM: Magnesium: 2.4 mg/dL (ref 1.7–2.4)

## 2020-07-15 MED ORDER — CEFDINIR 300 MG PO CAPS
300.0000 mg | ORAL_CAPSULE | Freq: Two times a day (BID) | ORAL | Status: DC
  Administered 2020-07-15: 300 mg via ORAL
  Filled 2020-07-15 (×2): qty 1

## 2020-07-15 MED ORDER — CEFDINIR 300 MG PO CAPS: 300.0000 mg | ORAL_CAPSULE | Freq: Two times a day (BID) | ORAL | 0 refills | Status: AC

## 2020-07-15 NOTE — TOC Transition Note (Signed)
Transition of Care [facility]) - CM/SW Discharge Note   Patient Details  Name: KIEU QUIGGLE MRN: [id number] Date of Birth: June 13, 1941  [facility]) CM/SW Contact:  Beverly Sessions, RN Phone Number: 07/15/2020, 2:55 PM   Clinical Narrative:    Patient to discharge home today Patient confirms she no longer wants to go to rehab  Son will be picking up patient today Patient still agreeable to home health services  Corene Cornea with [facility] notified of discharge Mardene Celeste with [employer] to deliver RW to room prior to discharge  Spoke with son, he I sin agreement with plan   Final next level of care: [place] Barriers to Discharge: No Barriers Identified   Patient Goals and CMS Choice        Discharge Placement                       Discharge Plan and Services   Discharge Planning Services: CM Consult            DME Arranged: [place] Rile rolling DME Agency: [employer]       HH Arranged: RN,PT,Nurse's Aide [place]: [place] ([place]) Date HH Agency Contacted: 07/15/20   Representative spoke with at [place]: [place] ([place]) Interventions     Readmission Risk Interventions No flowsheet data found.

## 2020-07-15 NOTE — Progress Notes (Signed)
Mobility Specialist - Progress Note   07/15/20 1600  Mobility  Activity Sat and stood x 3  Range of Motion/Exercises All extremities (SLR, MARCH, ABD, SQUATS, EF, CR, AR)  Level of Assistance Independent  Assistive Device None  Distance Ambulated (ft) 0 ft  Mobility Response Tolerated well  Mobility performed by Mobility specialist  $Mobility charge 1 Mobility    Pt was lying in bed upon arrival utilizing room air. Pt agreed to session. Pt denied pain, nausea, or fatigue. Pt performed all transfers and activity independently this date. Pt sat EOB and performed STSx3 progressing to march on 3rd attempt. Pt continued with exercises while in full upright position: straight leg raises, hip abduction, mini squats, elbow flexion, crossover reach, and arm raises. Overall, pt tolerated session well. Pt was left in bed with all needs in reach, anticipating d/c later this date.    Kathee Delton Mobility Specialist 07/15/20, 4:11 PM

## 2020-07-16 ENCOUNTER — Telehealth: Payer: Self-pay

## 2020-07-16 NOTE — Discharge Summary (Signed)
Physician Discharge Summary   CALDONIA DARNER [id number] DOB: [birth year] DOA: 07/07/2020  PCP: Valerie Roys, DO  Admit date: 07/07/2020 Discharge date: 07/16/2020  Admitted From: home Disposition:  home Discharging physician: Dwyane Dee, MD  Recommendations for Outpatient Follow-up:  1. Follow up with urology  Patient discharged to home in Discharge Condition: stable CODE STATUS: Full Diet recommendation:  Diet Orders (From admission, onward)    Start     Ordered   07/15/20 0000  Diet general        07/15/20 1418          Hospital Course: 80 year old female with past medical history only of kidney stones presented from home to the emergency room on 07/07/20 with several days of low back pain plus fever and nausea that had started that afternoon. In the emergency room, work-up revealed patient was in sepsis secondary to UTI likely caused by left-sided kidney stones that had nearly completely obstructed her left kidney. Urology was consulted and patient was started on IV antibiotics and fluids. Patient was taken for urgent cystoscopy with left ureteral stent placement. Because of her sepsis, stone was not removed at that time. She was continued on Zosyn during hospitalization after stent placement.  Urine culture grew E. coli and patient was de-escalated down to [facility] and was continued at discharge to complete a total of 2-week course.  She will follow-up with urology outpatient for definitive stone and stent removal.  She had good clinical improvement overall and her strength significantly improved.  She had initially been recommended for rehab per PT however she declined and wished to go home at time of discharge.  She ambulated well with staff and she was comfortable with going home per her wishes.   The patient's chronic medical conditions were treated accordingly per the patient's home medication regimen except as noted.  On day of discharge, patient was felt deemed  stable for discharge. Patient/family member advised to call PCP or come back to ER if needed.   Principal Diagnosis: Severe sepsis [facility])  Discharge Diagnoses: Active Hospital Problems   Diagnosis Date Noted  . Acute diastolic CHF (congestive heart failure) ([place]) 07/10/2020  . Protein-calorie malnutrition, severe 07/10/2020  . Right leg weakness 07/08/2020  . Ureteral obstruction, left 07/08/2020  . Cardiomegaly 07/08/2020  . Hypercholesteremia 06/08/2016    Resolved Hospital Problems   Diagnosis Date Noted Date Resolved  . Severe sepsis ([place]) 07/07/2020 07/16/2020    Priority: High  . Acute lower UTI 07/08/2020 07/16/2020    Discharge Instructions    Diet general   Complete by: As directed    Face-to-face encounter (required for Medicare/Medicaid patients)   Complete by: As directed    I Dwyane Dee certify that this patient is under my care and that I, or a nurse practitioner or physician's assistant working with me, had a face-to-face encounter that meets the physician face-to-face encounter requirements with this patient on 07/15/2020. The encounter with the patient was in whole, or in part for the following medical condition(s) which is the primary reason for home health care (List medical condition): Generalized muscle weakness   The encounter with the patient was in whole, or in part, for the following medical condition, which is the primary reason for home health care: Generalized muscle weakness   I certify that, based on my findings, the following services are medically necessary home health services:  Nursing Physical therapy     Reason for Medically Necessary Home Health Services:  Skilled  Nursing- Skilled Assessment/Observation Skilled Nursing- Change/Decline in Patient Status     My clinical findings support the need for the above services: Shortness of breath with activity   Further, I certify that my clinical findings support that this patient is homebound due to:   Ambulates short distances less than 300 feet Shortness of Breath with activity     For home use only DME 4 wheeled rolling walker with seat   Complete by: As directed    Patient needs a walker to treat with the following condition: Generalized muscle weakness   Home Health   Complete by: As directed    To provide the following care/treatments:  PT RN Home Health Aide     Increase activity slowly   Complete by: As directed    No wound care   Complete by: As directed      Allergies as of 07/15/2020   No Known Allergies     Medication List    TAKE these medications   ascorbic acid 500 MG tablet Commonly known as: VITAMIN C Take by mouth.   aspirin EC 81 MG tablet Take 81 mg by mouth daily.   BIOTIN PO Take 1 tablet by mouth daily.   cefdinir 300 MG capsule Commonly known as: OMNICEF Take 1 capsule (300 mg total) by mouth 2 (two) times daily for 8 days.   tretinoin 0.05 % cream Commonly known as: RETIN-A Apply topically at bedtime.   VITAMIN A PO Take 1 tablet by mouth daily.   VITAMIN E PO Take 1 tablet by mouth daily.            Durable Medical Equipment  (From admission, onward)         Start     Ordered   07/15/20 0000  For home use only DME 4 wheeled rolling walker with seat       Question:  Patient needs a walker to treat with the following condition  Answer:  Generalized muscle weakness   07/15/20 1431          Follow-up Information    Johnson, Megan P, DO. Schedule an appointment as soon as possible for a visit in 2 week(s).   Specialty: Family Medicine Contact information: [place] [address] [phone]        Abbie Sons, MD. Schedule an appointment as soon as possible for a visit in 2 week(s).   Specialty: Urology Why: Call for appointment if you do not hear from the office after discharge Contact information: [place] [address] [phone]              No Known  Allergies  Consultations: Urology  Discharge Exam: BP 117/70 (BP Location: Right Arm)   Pulse 100   Temp 98 F (36.7 C) (Oral)   Resp 16   Ht 5\' 7"  (1.702 m)   Wt 52.7 kg   SpO2 98%   BMI 18.20 kg/m  General appearance: alert, cooperative and no distress Head: Normocephalic, without obvious abnormality, atraumatic Eyes: EOMI Lungs: clear to auscultation bilaterally Heart: regular rate and rhythm and S1, S2 normal Abdomen: normal findings: bowel sounds normal and soft, non-tender Extremities: no edema Skin: mobility and turgor normal Neurologic: Grossly normal  The results of significant diagnostics from this hospitalization (including imaging, microbiology, ancillary and laboratory) are listed below for reference.   Microbiology: Recent Results (from the past 240 hour(s))  Urine culture     Status:  Abnormal   Collection Time: 07/07/20  7:33 PM   Specimen: Urine, Random  Result Value Ref Range Status   Specimen Description   Final    URINE, RANDOM Performed at [facility], [address] [place], [place] [address]    Special Requests   Final    NONE Performed at [facility], [address] [place], [place] [address]    Culture >=100,000 COLONIES/mL ESCHERICHIA COLI (A)  Final   Report Status 07/10/2020 FINAL  Final   Organism ID, Bacteria ESCHERICHIA COLI (A)  Final      Susceptibility   Escherichia coli - MIC*    AMPICILLIN >=32 RESISTANT Resistant     CEFAZOLIN <=4 SENSITIVE Sensitive     CEFEPIME <=0.12 SENSITIVE Sensitive     CEFTRIAXONE <=0.25 SENSITIVE Sensitive     CIPROFLOXACIN >=4 RESISTANT Resistant     GENTAMICIN <=1 SENSITIVE Sensitive     IMIPENEM <=0.25 SENSITIVE Sensitive     NITROFURANTOIN <=16 SENSITIVE Sensitive     TRIMETH/SULFA <=20 SENSITIVE Sensitive     AMPICILLIN/SULBACTAM >=32 RESISTANT Resistant     PIP/TAZO <=4 SENSITIVE Sensitive     * >=100,000 COLONIES/mL ESCHERICHIA COLI  Resp Panel by RT-PCR (Flu A&B,  Covid) Nasopharyngeal Swab     Status: None   Collection Time: 07/07/20 10:56 PM   Specimen: Nasopharyngeal Swab; Nasopharyngeal(NP) swabs in vial transport medium  Result Value Ref Range Status   SARS Coronavirus 2 by RT PCR NEGATIVE NEGATIVE Final    Comment: (NOTE) SARS-CoV-2 target nucleic acids are NOT DETECTED.  The SARS-CoV-2 RNA is generally detectable in upper respiratory specimens during the acute phase of infection. The lowest concentration of SARS-CoV-2 viral copies this assay can detect is 138 copies/mL. A negative result does not preclude SARS-Cov-2 infection and should not be used as the sole basis for treatment or other patient management decisions. A negative result may occur with  improper specimen collection/handling, submission of specimen other than nasopharyngeal swab, presence of viral mutation(s) within the areas targeted by this assay, and inadequate number of viral copies(<138 copies/mL). A negative result must be combined with clinical observations, patient history, and epidemiological information. The expected result is Negative.  Fact Sheet for Patients:  EntrepreneurPulse.com.au  Fact Sheet for Healthcare Providers:  IncredibleEmployment.be  This test is no t yet approved or cleared by the [place] FDA and  has been authorized for detection and/or diagnosis of SARS-CoV-2 by FDA under an Emergency Use Authorization (EUA). This EUA will remain  in effect (meaning this test can be used) for the duration of the COVID-19 declaration under Section 564(b)(1) of the Act, 21 U.S.C.section 360bbb-3(b)(1), unless the authorization is terminated  or revoked sooner.       Influenza A by PCR NEGATIVE NEGATIVE Final   Influenza B by PCR NEGATIVE NEGATIVE Final    Comment: (NOTE) The Xpert Xpress SARS-CoV-2/FLU/RSV plus assay is intended as an aid in the diagnosis of influenza from Nasopharyngeal swab specimens and should  not be used as a sole basis for treatment. Nasal washings and aspirates are unacceptable for Xpert Xpress SARS-CoV-2/FLU/RSV testing.  Fact Sheet for Patients: EntrepreneurPulse.com.au  Fact Sheet for Healthcare Providers: IncredibleEmployment.be  This test is not yet approved or cleared by the [place] FDA and has been authorized for detection and/or diagnosis of SARS-CoV-2 by FDA under an Emergency Use Authorization (EUA). This EUA will remain in effect (meaning this test can be used) for the duration of the COVID-19 declaration under  Section 564(b)(1) of the Act, 21 U.S.C. section 360bbb-3(b)(1), unless the authorization is terminated or revoked.  Performed at [facility], [address] [place], [place] [address]   Urine Culture     Status: Abnormal   Collection Time: 07/08/20 12:59 AM   Specimen: PATH Other; Urine  Result Value Ref Range Status   Specimen Description   Final    URINE, RANDOM Performed at [facility], [address] [place], [place] [address]    Special Requests   Final    LEFT RENAL PELVIS Performed at [facility], [address] [place],  [address]    Culture >=100,000 COLONIES/mL ESCHERICHIA COLI (A)  Final   Report Status 07/10/2020 FINAL  Final   Organism ID, Bacteria ESCHERICHIA COLI (A)  Final      Susceptibility   Escherichia coli - MIC*    AMPICILLIN >=32 RESISTANT Resistant     CEFAZOLIN <=4 SENSITIVE Sensitive     CEFEPIME <=0.12 SENSITIVE Sensitive     CEFTRIAXONE <=0.25 SENSITIVE Sensitive     CIPROFLOXACIN >=4 RESISTANT Resistant     GENTAMICIN <=1 SENSITIVE Sensitive     IMIPENEM <=0.25 SENSITIVE Sensitive     NITROFURANTOIN <=16 SENSITIVE Sensitive     TRIMETH/SULFA <=20 SENSITIVE Sensitive     AMPICILLIN/SULBACTAM >=32 RESISTANT Resistant     PIP/TAZO <=4 SENSITIVE Sensitive     * >=100,000 COLONIES/mL ESCHERICHIA COLI     Labs: BNP (last 3  results) Recent Labs    07/09/20 0356  BNP 0000000*   Basic Metabolic Panel: Recent Labs  Lab 07/11/20 0426 07/12/20 0512 07/13/20 0446 07/14/20 0500 07/15/20 0444  NA 138 138 138 138 136  K 3.3* 3.1* 3.5 3.4* 3.8  CL 98 98 97* 98 96*  CO2 30 28 29 31 30   GLUCOSE 92 112* 107* 85 88  BUN 19 27* 38* 38* 41*  CREATININE 0.67 0.65 0.65 0.62 0.70  CALCIUM 8.7* 9.0 9.0 8.9 9.1  MG  --   --  2.4  --  2.4   Liver Function Tests: No results for input(s): AST, ALT, ALKPHOS, BILITOT, PROT, ALBUMIN in the last 168 hours. No results for input(s): LIPASE, AMYLASE in the last 168 hours. No results for input(s): AMMONIA in the last 168 hours. CBC: Recent Labs  Lab 07/11/20 0426 07/12/20 0512 07/13/20 0446 07/14/20 0500 07/15/20 0444  WBC 15.9* 8.4 7.5 9.0 8.3  HGB 11.8* 12.9 13.2 12.8 13.0  HCT 34.5* 37.6 39.6 38.7 38.1  MCV 92.5 92.2 93.6 94.4 95.0  PLT 195 233 287 329 375   Cardiac Enzymes: Recent Labs  Lab 07/10/20 0310  CKTOTAL 127   BNP: Invalid input(s): POCBNP CBG: No results for input(s): GLUCAP in the last 168 hours. D-Dimer No results for input(s): DDIMER in the last 72 hours. Hgb A1c No results for input(s): HGBA1C in the last 72 hours. Lipid Profile No results for input(s): CHOL, HDL, LDLCALC, TRIG, CHOLHDL, LDLDIRECT in the last 72 hours. Thyroid function studies No results for input(s): TSH, T4TOTAL, T3FREE, THYROIDAB in the last 72 hours.  Invalid input(s): FREET3 Anemia work up No results for input(s): VITAMINB12, FOLATE, FERRITIN, TIBC, IRON, RETICCTPCT in the last 72 hours. Urinalysis    Component Value Date/Time   COLORURINE YELLOW (A) 07/07/2020 1933   APPEARANCEUR CLOUDY (A) 07/07/2020 1933   APPEARANCEUR Clear 06/14/2019 0947   LABSPEC 1.011 07/07/2020 1933   PHURINE 5.0 07/07/2020 1933   GLUCOSEU NEGATIVE 07/07/2020 1933   HGBUR  MODERATE (A) 07/07/2020 1933   BILIRUBINUR NEGATIVE 07/07/2020 1933   BILIRUBINUR Negative 06/14/2019 [place] 07/07/2020 1933   PROTEINUR 30 (A) 07/07/2020 1933   NITRITE POSITIVE (A) 07/07/2020 1933   LEUKOCYTESUR TRACE (A) 07/07/2020 1933   Sepsis Labs Invalid input(s): PROCALCITONIN,  WBC,  LACTICIDVEN Microbiology Recent Results (from the past 240 hour(s))  Urine culture     Status: Abnormal   Collection Time: 07/07/20  7:33 PM   Specimen: Urine, Random  Result Value Ref Range Status   Specimen Description   Final    URINE, RANDOM Performed at [facility], [address], [place], [place] [address]    Special Requests   Final    NONE Performed at [facility], [address] [place], [place] [address]    Culture >=100,000 COLONIES/mL ESCHERICHIA COLI (A)  Final   Report Status 07/10/2020 FINAL  Final   Organism ID, Bacteria ESCHERICHIA COLI (A)  Final      Susceptibility   Escherichia coli - MIC*    AMPICILLIN >=32 RESISTANT Resistant     CEFAZOLIN <=4 SENSITIVE Sensitive     CEFEPIME <=0.12 SENSITIVE Sensitive     CEFTRIAXONE <=0.25 SENSITIVE Sensitive     CIPROFLOXACIN >=4 RESISTANT Resistant     GENTAMICIN <=1 SENSITIVE Sensitive     IMIPENEM <=0.25 SENSITIVE Sensitive     NITROFURANTOIN <=16 SENSITIVE Sensitive     TRIMETH/SULFA <=20 SENSITIVE Sensitive     AMPICILLIN/SULBACTAM >=32 RESISTANT Resistant     PIP/TAZO <=4 SENSITIVE Sensitive     * >=100,000 COLONIES/mL ESCHERICHIA COLI  Resp Panel by RT-PCR (Flu A&B, Covid) Nasopharyngeal Swab     Status: None   Collection Time: 07/07/20 10:56 PM   Specimen: Nasopharyngeal Swab; Nasopharyngeal(NP) swabs in vial transport medium  Result Value Ref Range Status   SARS Coronavirus 2 by RT PCR NEGATIVE NEGATIVE Final    Comment: (NOTE) SARS-CoV-2 target nucleic acids are NOT DETECTED.  The SARS-CoV-2 RNA is generally detectable in upper respiratory specimens during the acute phase of infection. The lowest concentration of SARS-CoV-2 viral copies this assay can detect is 138 copies/mL.  A negative result does not preclude SARS-Cov-2 infection and should not be used as the sole basis for treatment or other patient management decisions. A negative result may occur with  improper specimen collection/handling, submission of specimen other than nasopharyngeal swab, presence of viral mutation(s) within the areas targeted by this assay, and inadequate number of viral copies(<138 copies/mL). A negative result must be combined with clinical observations, patient history, and epidemiological information. The expected result is Negative.  Fact Sheet for Patients:  EntrepreneurPulse.com.au  Fact Sheet for Healthcare Providers:  IncredibleEmployment.be  This test is no t yet approved or cleared by the [place] FDA and  has been authorized for detection and/or diagnosis of SARS-CoV-2 by FDA under an Emergency Use Authorization (EUA). This EUA will remain  in effect (meaning this test can be used) for the duration of the COVID-19 declaration under Section 564(b)(1) of the Act, 21 U.S.C.section 360bbb-3(b)(1), unless the authorization is terminated  or revoked sooner.       Influenza A by PCR NEGATIVE NEGATIVE Final   Influenza B by PCR NEGATIVE NEGATIVE Final    Comment: (NOTE) The Xpert Xpress SARS-CoV-2/FLU/RSV plus assay is intended as an aid in the diagnosis of influenza from Nasopharyngeal swab specimens and should not be used as a sole basis for treatment. Nasal washings and aspirates are unacceptable  for Xpert Xpress SARS-CoV-2/FLU/RSV testing.  Fact Sheet for Patients: EntrepreneurPulse.com.au  Fact Sheet for Healthcare Providers: IncredibleEmployment.be  This test is not yet approved or cleared by the [place] FDA and has been authorized for detection and/or diagnosis of SARS-CoV-2 by FDA under an Emergency Use Authorization (EUA). This EUA will remain in effect (meaning this test  can be used) for the duration of the COVID-19 declaration under Section 564(b)(1) of the Act, 21 U.S.C. section 360bbb-3(b)(1), unless the authorization is terminated or revoked.  Performed at [facility], [address] [place], [place] [address]   Urine Culture     Status: Abnormal   Collection Time: 07/08/20 12:59 AM   Specimen: PATH Other; Urine  Result Value Ref Range Status   Specimen Description   Final    URINE, RANDOM Performed at [facility], [address] [place], [place] [address]    Special Requests   Final    LEFT RENAL PELVIS Performed at [facility], [place], [place] [address]    Culture >=100,000 COLONIES/mL ESCHERICHIA COLI (A)  Final   Report Status 07/10/2020 FINAL  Final   Organism ID, Bacteria ESCHERICHIA COLI (A)  Final      Susceptibility   Escherichia coli - MIC*    AMPICILLIN >=32 RESISTANT Resistant     CEFAZOLIN <=4 SENSITIVE Sensitive     CEFEPIME <=0.12 SENSITIVE Sensitive     CEFTRIAXONE <=0.25 SENSITIVE Sensitive     CIPROFLOXACIN >=4 RESISTANT Resistant     GENTAMICIN <=1 SENSITIVE Sensitive     IMIPENEM <=0.25 SENSITIVE Sensitive     NITROFURANTOIN <=16 SENSITIVE Sensitive     TRIMETH/SULFA <=20 SENSITIVE Sensitive     AMPICILLIN/SULBACTAM >=32 RESISTANT Resistant     PIP/TAZO <=4 SENSITIVE Sensitive     * >=100,000 COLONIES/mL ESCHERICHIA COLI    Procedures/Studies: DG Abd 1 View  Result Date: 07/10/2020 CLINICAL DATA:  Recent stent placement.  Recent ureteral calculus. EXAM: ABDOMEN - 1 VIEW COMPARISON:  CT abdomen and pelvis July 07, 2020; retrograde pyelogram July 08, 2020 FINDINGS: Double-J stent extends from the L1-2 level to the bladder. No abnormal calcifications evident. There is no bowel dilatation or air-fluid level to suggest bowel obstruction. No free air. Mild stool in colon. Scoliosis and arthropathy noted in the lumbar region. IMPRESSION: Double-J stent on the left  from the L1-2 level to the bladder. No abnormal calcifications. No bowel obstruction or free air. Electronically Signed   By: Lowella Grip III M.D.   On: 07/10/2020 13:18   MR FEMUR RIGHT W WO CONTRAST  Result Date: 07/11/2020 CLINICAL DATA:  Right thigh pain and weakness.  Mildly elevated CPK EXAM: MRI OF THE RIGHT FEMUR WITHOUT AND WITH CONTRAST TECHNIQUE: Multiplanar, multisequence MR imaging of the right femur was performed both before and after administration of intravenous contrast. CONTRAST:  67mL GADAVIST GADOBUTROL 1 MMOL/ML IV SOLN COMPARISON:  None. FINDINGS: Bones/Joint/Cartilage No acute fracture. No dislocation. 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 Subchondral marrow edema at the superior aspect of the right femoral head. No evidence of avascular necrosis. Trace joint effusion. Right knee joint is within normal limits without significant arthropathy or evidence of internal derangement. Heterogeneous appearance of the bone marrow signal of the visualized pelvis without evidence of a marrow replacing lesion. Ligaments Unremarkable. Muscles and Tendons Mild intramuscular edema within the right gluteus medius and gluteus minimus muscles as well  as within the right abductor muscle compartment. Normal muscle bulk without atrophy or fatty infiltration. Tendinous structures about the hip and knee appear intact. Soft tissues Mild subcutaneous edema overlying the bilateral gluteal musculature. No organized fluid collection. No deep fascial edema or enhancement. Foley catheter is position within the bladder lumen. Fibroid uterus. IMPRESSION: 1. Mild intramuscular edema within the right gluteus medius and gluteus minimus muscles as well as within the right abductor muscle compartment. Findings are nonspecific and could be related to muscle strain or myositis. 2. Severe osteoarthritis of the  right hip. 3. Heterogeneous appearance of the bone marrow signal of the pelvis without discrete marrow replacing lesion. Findings are nonspecific but can be seen in the setting of chronic anemia, smoking, and/or obesity. Electronically Signed   By: Davina Poke D.O.   On: 07/11/2020 15:26   US Venous Img Lower Unilateral Right (DVT)  Result Date: 07/10/2020 CLINICAL DATA:  80 year old female with a history of right thigh pain EXAM: RIGHT LOWER EXTREMITY VENOUS DOPPLER ULTRASOUND TECHNIQUE: Gray-scale sonography with graded compression, as well as color Doppler and duplex ultrasound were performed to evaluate the lower extremity deep venous systems from the level of the common femoral vein and including the common femoral, femoral, profunda femoral, popliteal and calf veins including the posterior tibial, peroneal and gastrocnemius veins when visible. The superficial great saphenous vein was also interrogated. Spectral Doppler was utilized to evaluate flow at rest and with distal augmentation maneuvers in the common femoral, femoral and popliteal veins. COMPARISON:  None. FINDINGS: Contralateral Common Femoral Vein: Respiratory phasicity is normal and symmetric with the symptomatic side. No evidence of thrombus. Normal compressibility. Common Femoral Vein: No evidence of thrombus. Normal compressibility, respiratory phasicity and response to augmentation. Saphenofemoral Junction: No evidence of thrombus. Normal compressibility and flow on color Doppler imaging. Profunda Femoral Vein: No evidence of thrombus. Normal compressibility and flow on color Doppler imaging. Femoral Vein: No evidence of thrombus. Normal compressibility, respiratory phasicity and response to augmentation. Popliteal Vein: No evidence of thrombus. Normal compressibility, respiratory phasicity and response to augmentation. Calf Veins: No evidence of thrombus. Normal compressibility and flow on color Doppler imaging. Superficial Great  Saphenous Vein: No evidence of thrombus. Normal compressibility and flow on color Doppler imaging. Other Findings: Lentiform anechoic fluid collection in the popliteal region measures 2.5 cm, compatible with Baker's cyst. IMPRESSION: Sonographic survey right lower extremity negative for DVT. Baker's cyst Electronically Signed   By: Corrie Mckusick D.O.   On: 07/10/2020 14:35   DG OR UROLOGY CYSTO IMAGE ([facility] ONLY)  Result Date: 07/08/2020 There is no interpretation for this exam.  This order is for images obtained during a surgical procedure.  Please See "Surgeries" Tab for more information regarding the procedure.   ECHOCARDIOGRAM COMPLETE  Result Date: 07/09/2020    ECHOCARDIOGRAM REPORT   Patient Name:   MEHA GOON [facility] Date of Exam: 07/09/2020 Medical Rec #:  [id number]          Height:       67.0 in Accession #:    [id number]         Weight:       110.0 lb Date of Birth:  Sep 15, 1940           BSA:          1.569 m Patient Age:    6 years           BP:           131/81 mmHg Patient  Gender: F                  HR:           82 bpm. Exam Location:  [facility] Procedure: 2D Echo, Color Doppler, Cardiac Doppler and Strain Analysis Indications:     I50.31 CHF-Acute Diastolic  History:         Patient has no prior history of Echocardiogram examinations.                  Signs/Symptoms:Back pain.  Sonographer:     Charmayne Sheer RDCS (AE) Referring Phys:  [id number] Diagnosing Phys: Kate Sable MD  Sonographer Comments: Suboptimal subcostal window. Global longitudinal strain was attempted. IMPRESSIONS  1. Left ventricular ejection fraction, by estimation, is 55 to 60%. The left ventricle has normal function. The left ventricle has no regional wall motion abnormalities. Left ventricular diastolic parameters are consistent with Grade I diastolic dysfunction (impaired relaxation).  2. Right ventricular systolic function is normal. The right ventricular size is normal. There is mildly elevated pulmonary  artery systolic pressure.  3. The mitral valve is normal in structure. Mild mitral valve regurgitation.  4. The aortic valve is tricuspid. Aortic valve regurgitation is not visualized.  5. The inferior vena cava is dilated in size with <50% respiratory variability, suggesting right atrial pressure of 15 mmHg. FINDINGS  Left Ventricle: Left ventricular ejection fraction, by estimation, is 55 to 60%. The left ventricle has normal function. The left ventricle has no regional wall motion abnormalities. The left ventricular internal cavity size was normal in size. There is  no left ventricular hypertrophy. Left ventricular diastolic parameters are consistent with Grade I diastolic dysfunction (impaired relaxation). Right Ventricle: The right ventricular size is normal. No increase in right ventricular wall thickness. Right ventricular systolic function is normal. There is mildly elevated pulmonary artery systolic pressure. The tricuspid regurgitant velocity is 2.41  m/s, and with an assumed right atrial pressure of 15 mmHg, the estimated right ventricular systolic pressure is 0000000 mmHg. Left Atrium: Left atrial size was normal in size. Right Atrium: Right atrial size was normal in size. Pericardium: There is no evidence of pericardial effusion. Mitral Valve: The mitral valve is normal in structure. Mild mitral valve regurgitation. MV peak gradient, 5.3 mmHg. The mean mitral valve gradient is 1.0 mmHg. Tricuspid Valve: The tricuspid valve is normal in structure. Tricuspid valve regurgitation is mild. Aortic Valve: The aortic valve is tricuspid. Aortic valve regurgitation is not visualized. Aortic valve mean gradient measures 4.0 mmHg. Aortic valve peak gradient measures 6.1 mmHg. Aortic valve area, by VTI measures 1.62 cm. Pulmonic Valve: The pulmonic valve was normal in structure. Pulmonic valve regurgitation is not visualized. Aorta: The aortic root is normal in size and structure. Venous: The inferior vena cava is  dilated in size with less than 50% respiratory variability, suggesting right atrial pressure of 15 mmHg. IAS/Shunts: No atrial level shunt detected by color flow Doppler.  LEFT VENTRICLE PLAX 2D LVIDd:         4.40 cm     Diastology LVIDs:         3.50 cm     LV e' medial:    6.64 cm/s LV PW:         1.20 cm     LV E/e' medial:  11.8 LV IVS:        1.00 cm     LV e' lateral:   7.29 cm/s LVOT diam:  1.80 cm     LV E/e' lateral: 10.8 LV SV:         40 LV SV Index:   26 LVOT Area:     2.54 cm  LV Volumes (MOD) LV vol d, MOD A2C: 58.8 ml LV vol d, MOD A4C: 67.4 ml LV vol s, MOD A2C: 27.9 ml LV vol s, MOD A4C: 35.1 ml LV SV MOD A2C:     30.9 ml LV SV MOD A4C:     67.4 ml LV SV MOD BP:      32.4 ml RIGHT VENTRICLE RV Basal diam:  2.90 cm TAPSE (M-mode): 2.8 cm LEFT ATRIUM             Index       RIGHT ATRIUM           Index LA diam:        3.90 cm 2.49 cm/m  RA Area:     16.80 cm LA Vol (A2C):   32.8 ml 20.91 ml/m RA Volume:   45.60 ml  29.07 ml/m LA Vol (A4C):   29.7 ml 18.94 ml/m LA Biplane Vol: 32.1 ml 20.47 ml/m  AORTIC VALVE                   PULMONIC VALVE AV Area (Vmax):    1.68 cm    PV Vmax:       0.64 m/s AV Area (Vmean):   1.59 cm    PV Vmean:      46.600 cm/s AV Area (VTI):     1.62 cm    PV VTI:        0.108 m AV Vmax:           123.00 cm/s PV Peak grad:  1.6 mmHg AV Vmean:          89.700 cm/s PV Mean grad:  1.0 mmHg AV VTI:            0.248 m AV Peak Grad:      6.1 mmHg AV Mean Grad:      4.0 mmHg LVOT Vmax:         81.30 cm/s LVOT Vmean:        56.000 cm/s LVOT VTI:          0.158 m LVOT/AV VTI ratio: 0.64  AORTA Ao Root diam: 2.80 cm MITRAL VALVE                TRICUSPID VALVE MV Area (PHT): 6.48 cm     TR Peak grad:   23.2 mmHg MV Area VTI:   1.60 cm     TR Vmax:        241.00 cm/s MV Peak grad:  5.3 mmHg MV Mean grad:  1.0 mmHg     SHUNTS MV Vmax:       1.15 m/s     Systemic VTI:  0.16 m MV Vmean:      56.8 cm/s    Systemic Diam: 1.80 cm MV Decel Time: 117 msec MV E velocity: 78.60 cm/s  MV A velocity: 117.00 cm/s MV E/A ratio:  0.67 Kate Sable MD Electronically signed by Kate Sable MD Signature Date/Time: 07/09/2020/3:34:56 PM    Final    CT Renal Stone Study  Result Date: 07/07/2020 CLINICAL DATA:  Left flank pain EXAM: CT ABDOMEN AND PELVIS WITHOUT CONTRAST TECHNIQUE: Multidetector CT imaging of the abdomen and pelvis was performed following the standard protocol without IV contrast. COMPARISON:  None. FINDINGS:  Lower chest: Calcifications in the lung bases compatible with old granulomatous disease. Cardiomegaly. No effusions. Hepatobiliary: No focal hepatic abnormality. Gallbladder unremarkable. Pancreas: No focal abnormality or ductal dilatation. Spleen: No focal abnormality.  Normal size. Adrenals/Urinary Tract: Severe left hydronephrosis and hydroureter due to 2 mm distal left ureteral stone and 2 mm left UVJ stone. Bilateral nonobstructing renal stones. Upper pole cyst on the left measures 4.7 cm. Stomach/Bowel: Moderate stool burden throughout the colon. No bowel obstruction. Vascular/Lymphatic: No evidence of aneurysm or adenopathy. Reproductive: Uterus and adnexa unremarkable.  No mass. Other: No free fluid or free air. Musculoskeletal: No acute bony abnormality. IMPRESSION: Severe left hydronephrosis due to 2 distal left ureteral stones, both 2 mm. Bilateral nephrolithiasis. Electronically Signed   By: Rolm Baptise M.D.   On: 07/07/2020 22:42     Time coordinating discharge: Over 30 minutes    Dwyane Dee, MD  [facility] 07/16/2020, 5:57 PM

## 2020-07-16 NOTE — Telephone Encounter (Signed)
Transition Care Management Unsuccessful Follow-up Telephone Call  Date of discharge and from where:  07/15/2020 [facility]  Attempts:  1st Attempt  Reason for unsuccessful TCM follow-up call:  Left voice message

## 2020-07-16 NOTE — Telephone Encounter (Signed)
Verbal order given  

## 2020-07-16 NOTE — Telephone Encounter (Signed)
Copied from [place] [id number]. Topic: Quick Communication - Home Health Verbal Orders >> Jul 16, 2020  9:37 AM Gillis Ends D wrote: Caller/Agency: Tiffany from [place] [facility] Number: [phone] option 2 Requesting OT/PT/Skilled Nursing/Social Work/Speech Therapy:Skilled Nursing Frequency: One time visit on July 18, 2020 and then will call back for frequency's

## 2020-07-16 NOTE — Telephone Encounter (Signed)
OK for verbal.  

## 2020-07-17 ENCOUNTER — Telehealth: Payer: Self-pay

## 2020-07-17 NOTE — Telephone Encounter (Signed)
Transition Care Management Follow-up Telephone Call  Date of discharge and from where: 07/15/2020 [facility]  How have you been since you were released from the hospital? Doing better  Any questions or concerns? No  Items Reviewed:  Did the pt receive and understand the discharge instructions provided? Yes   Medications obtained and verified? Yes   Other? No   Any new allergies since your discharge? No   Dietary orders reviewed? Yes  Do you have support at home? Yes   Home Care and Equipment/Supplies: Were home health services ordered? yes If so, what is the name of the agency? [facility] Has the agency set up a time to come to the patient's home? no Were any new equipment or medical supplies ordered?  No What is the name of the medical supply agency? n/a Were you able to get the supplies/equipment? not applicable Do you have any questions related to the use of the equipment or supplies? No  Functional Questionnaire: (I = Independent and D = Dependent) ADLs: I  Bathing/Dressing- I  Meal Prep- I  Eating- I  Maintaining continence- I  Transferring/Ambulation- I  Managing Meds- I  Follow up appointments reviewed:   PCP Hospital f/u appt confirmed? Yes  Scheduled to see Dr. Wynetta Emery on 07/29/2020 @ 4:20..  Are transportation arrangements needed? No   If their condition worsens, is the pt aware to call PCP or go to the Emergency Dept.? Yes  Was the patient provided with contact information for the PCP's office or ED? Yes  Was to pt encouraged to call back with questions or concerns? Yes

## 2020-07-29 ENCOUNTER — Ambulatory Visit (INDEPENDENT_AMBULATORY_CARE_PROVIDER_SITE_OTHER): Payer: Medicare Other | Admitting: Family Medicine

## 2020-07-29 ENCOUNTER — Other Ambulatory Visit: Payer: Self-pay

## 2020-07-29 ENCOUNTER — Encounter: Payer: Self-pay | Admitting: Family Medicine

## 2020-07-29 VITALS — BP 144/77 | HR 69 | Temp 98.4°F | Ht 63.6 in | Wt 119.0 lb

## 2020-07-29 DIAGNOSIS — N200 Calculus of kidney: Secondary | ICD-10-CM

## 2020-07-29 DIAGNOSIS — N12 Tubulo-interstitial nephritis, not specified as acute or chronic: Secondary | ICD-10-CM

## 2020-07-29 NOTE — Progress Notes (Signed)
BP (!) 144/77 (BP Location: Left Arm, Cuff Size: Small)   Pulse 69   Temp 98.4 F (36.9 C) (Oral)   Ht 5' 3.6" (1.615 m)   Wt 119 lb (54 kg)   SpO2 96%   BMI 20.68 kg/m    Subjective:    Patient ID: Amanda Bruce, female    DOB: [birth year], 80 y.o.   MRN: [id number]  HPI: Amanda Bruce is a 80 y.o. female  Chief Complaint  Patient presents with  . Hospitalization Follow-up   Transition of Care Hospital Follow up.   Hospital/Facility: [facility] D/C Physician: Dr. Sabino Gasser D/C Date: 07/15/20   Records Requested: 07/29/20 Records Received: 07/29/20 Records Reviewed: 07/29/20  Diagnoses on Discharge: Severe Sepsis  Date of interactive Contact within 48 hours of discharge: 07/17/20 Contact was through: phone  Date of 7 day or 14 day face-to-face visit: 07/29/20   within 14 days  Outpatient Encounter Medications as of 07/29/2020  Medication Sig  . aspirin EC 81 MG tablet Take 81 mg by mouth daily.  Marland Kitchen tretinoin (RETIN-A) 0.05 % cream Apply topically at bedtime. (Patient taking differently: Apply 1 application topically 3 (three) times a week.)  . [DISCONTINUED] ascorbic acid (VITAMIN C) 500 MG tablet Take by mouth.  . [DISCONTINUED] BIOTIN PO Take 1 tablet by mouth daily.  . [DISCONTINUED] VITAMIN A PO Take 1 tablet by mouth daily.  . [DISCONTINUED] VITAMIN D PO Take by mouth.  . [DISCONTINUED] VITAMIN E PO Take 1 tablet by mouth daily.   No facility-administered encounter medications on file as of 07/29/2020.  Per Hospitalist: "80 year old female with past medical history only of kidney stones presentedfrom hometo the emergency room on 1/10/22with several days of low back pain plus fever and nausea that had started that afternoon. In the emergency room, work-up revealed patient was in sepsis secondary to UTIlikelycaused by left-sided kidney stones that had nearly completely obstructed her left kidney. Urology was consulted and patient was started on IV antibiotics and fluids.  Patient was taken for urgent cystoscopy with left ureteral stent placement. Because of her sepsis, stone was not removed at that time. She was continued on Zosyn during hospitalization after stent placement.  Urine culture grew E. coli and patient was de-escalated down to [facility] and was continued at discharge to complete a total of 2-week course.  She will follow-up with urology outpatient for definitive stone and stent removal.  She had good clinical improvement overall and her strength significantly improved.  She had initially been recommended for rehab per PT however she declined and wished to go home at time of discharge.  She ambulated well with staff and she was comfortable with going home per her wishes.   The patient's chronic medical conditions were treated accordingly per the patient's home medication regimen except as noted.  On day of discharge, patient was felt deemed stable for discharge. Patient/family member advised to call PCP or come back to ER if needed. "   Diagnostic Tests Reviewed:   Disposition: Home   Consults: Urology  Discharge Instructions:  Follow up here and with urology  Disease/illness Education: Discussed today.   Home Health/Community Services Discussions/Referrals: In place  Establishment or re-establishment of referral orders for community resources: In place  Discussion with other health care providers: None  Assessment and Support of treatment regimen adherence: Good  Appointments Coordinated with: Patient  Education for self-management, independent living, and ADLs: Discussed today  Since getting out of the hospital, she has been  doing a lot better. Has been having bilateral low back pain when she's active and having some blood in her urine. She is seeing her urologist tomorrow. She is otherwise feeling OK with no other concerns at this time.   Relevant past medical, surgical, family and social history reviewed and updated as indicated.  Interim medical history since our last visit reviewed. Allergies and medications reviewed and updated.  Review of Systems  Constitutional: Negative.   HENT: Negative.   Respiratory: Negative.   Cardiovascular: Negative.   Gastrointestinal: Negative.   Genitourinary: Positive for hematuria. Negative for decreased urine volume, difficulty urinating, dyspareunia, dysuria, enuresis, flank pain, frequency, genital sores, menstrual problem, pelvic pain, urgency, vaginal bleeding, vaginal discharge and vaginal pain.  Musculoskeletal: Positive for back pain. Negative for arthralgias, gait problem, joint swelling, myalgias, neck pain and neck stiffness.  Neurological: Negative.   Psychiatric/Behavioral: Negative.     Per HPI unless specifically indicated above     Objective:    BP (!) 144/77 (BP Location: Left Arm, Cuff Size: Small)   Pulse 69   Temp 98.4 F (36.9 C) (Oral)   Ht 5' 3.6" (1.615 m)   Wt 119 lb (54 kg)   SpO2 96%   BMI 20.68 kg/m   Wt Readings from Last 3 Encounters:  07/31/20 116 lb (52.6 kg)  07/29/20 119 lb (54 kg)  07/13/20 116 lb 2.9 oz (52.7 kg)    Physical Exam Vitals and nursing note reviewed.  Constitutional:      General: She is not in acute distress.    Appearance: Normal appearance. She is not ill-appearing, toxic-appearing or diaphoretic.  HENT:     Head: Normocephalic and atraumatic.     Right Ear: External ear normal.     Left Ear: External ear normal.     Nose: Nose normal.     Mouth/Throat:     Mouth: Mucous membranes are moist.     Pharynx: Oropharynx is clear.  Eyes:     General: No scleral icterus.       Right eye: No discharge.        Left eye: No discharge.     Extraocular Movements: Extraocular movements intact.     Conjunctiva/sclera: Conjunctivae normal.     Pupils: Pupils are equal, round, and reactive to light.  Cardiovascular:     Rate and Rhythm: Normal rate and regular rhythm.     Pulses: Normal pulses.     Heart sounds:  Normal heart sounds. No murmur heard. No friction rub. No gallop.   Pulmonary:     Effort: Pulmonary effort is normal. No respiratory distress.     Breath sounds: Normal breath sounds. No stridor. No wheezing, rhonchi or rales.  Chest:     Chest wall: No tenderness.  Musculoskeletal:        General: Normal range of motion.     Cervical back: Normal range of motion and neck supple.  Skin:    General: Skin is warm and dry.     Capillary Refill: Capillary refill takes less than 2 seconds.     Coloration: Skin is not jaundiced or pale.     Findings: No bruising, erythema, lesion or rash.  Neurological:     General: No focal deficit present.     Mental Status: She is alert and oriented to person, place, and time. Mental status is at baseline.  Psychiatric:        Mood and Affect: Mood normal.  Behavior: Behavior normal.        Thought Content: Thought content normal.        Judgment: Judgment normal.     Results for orders placed or performed in visit on 07/29/20  Microscopic Examination   Urine  Result Value Ref Range   WBC, UA >30 (A) 0 - 5 /hpf   RBC >30 (A) 0 - 2 /hpf   Epithelial Cells (non renal) 0-10 0 - 10 /hpf   Bacteria, UA None seen None seen/Few  Basic metabolic panel  Result Value Ref Range   Glucose 85 65 - 99 mg/dL   BUN 24 8 - 27 mg/dL   Creatinine, Ser 0.78 0.57 - 1.00 mg/dL   GFR calc non Af Amer 73 >59 mL/min/1.73   GFR calc Af Amer 84 >59 mL/min/1.73   BUN/Creatinine Ratio 31 (H) 12 - 28   Sodium 143 134 - 144 mmol/L   Potassium 3.9 3.5 - 5.2 mmol/L   Chloride 102 96 - 106 mmol/L   CO2 25 20 - 29 mmol/L   Calcium 9.7 8.7 - 10.3 mg/dL  Urinalysis, Routine w reflex microscopic  Result Value Ref Range   Specific Gravity, UA 1.020 1.005 - 1.030   pH, UA 7.0 5.0 - 7.5   Color, UA Red (A) Yellow   Appearance Ur Cloudy (A) Clear   Leukocytes,UA 3+ (A) Negative   Protein,UA 2+ (A) Negative/Trace   Glucose, UA Negative Negative   Ketones, UA  Negative Negative   RBC, UA 3+ (A) Negative   Bilirubin, UA Negative Negative   Urobilinogen, Ur 0.2 0.2 - 1.0 mg/dL   Nitrite, UA Negative Negative   Microscopic Examination See below:   CBC with Differential/Platelet  Result Value Ref Range   WBC 4.9 3.4 - 10.8 x10E3/uL   RBC 4.06 3.77 - 5.28 x10E6/uL   Hemoglobin 12.4 11.1 - 15.9 g/dL   Hematocrit 38.2 34.0 - 46.6 %   MCV 94 79 - 97 fL   MCH 30.5 26.6 - 33.0 pg   MCHC 32.5 31.5 - 35.7 g/dL   RDW 12.4 11.7 - 15.4 %   Platelets 357 150 - 450 x10E3/uL   Neutrophils 50 Not Estab. %   Lymphs 35 Not Estab. %   Monocytes 8 Not Estab. %   Eos 5 Not Estab. %   Basos 2 Not Estab. %   Neutrophils Absolute 2.5 1.4 - 7.0 x10E3/uL   Lymphocytes Absolute 1.7 0.7 - 3.1 x10E3/uL   Monocytes Absolute 0.4 0.1 - 0.9 x10E3/uL   EOS (ABSOLUTE) 0.2 0.0 - 0.4 x10E3/uL   Basophils Absolute 0.1 0.0 - 0.2 x10E3/uL   Immature Granulocytes 0 Not Estab. %   Immature Grans (Abs) 0.0 0.0 - 0.1 x10E3/uL      Assessment & Plan:   Problem List Items Addressed This Visit   None   Visit Diagnoses    Pyelonephritis    -  Primary   Rechecking UA and CBC today. Follow up with urology as scheduled. Call with any concerns. Continue to monitor.    Relevant Orders   Basic metabolic panel (Completed)   Urinalysis, Routine w reflex microscopic (Completed)   CBC with Differential/Platelet   Kidney stone       Rechecking UA and CBC today. Follow up with urology as scheduled. Call with any concerns. Continue to monitor.    Relevant Orders   Basic metabolic panel (Completed)   Urinalysis, Routine w reflex microscopic (Completed)  Follow up plan: Return if symptoms worsen or fail to improve.   >25 minutes spent with patient today.

## 2020-07-30 LAB — CBC WITH DIFFERENTIAL/PLATELET
Basophils Absolute: 0.1 10*3/uL (ref 0.0–0.2)
Basos: 2 %
EOS (ABSOLUTE): 0.2 10*3/uL (ref 0.0–0.4)
Eos: 5 %
Hematocrit: 38.2 % (ref 34.0–46.6)
Hemoglobin: 12.4 g/dL (ref 11.1–15.9)
Immature Grans (Abs): 0 10*3/uL (ref 0.0–0.1)
Immature Granulocytes: 0 %
Lymphocytes Absolute: 1.7 10*3/uL (ref 0.7–3.1)
Lymphs: 35 %
MCH: 30.5 pg (ref 26.6–33.0)
MCHC: 32.5 g/dL (ref 31.5–35.7)
MCV: 94 fL (ref 79–97)
Monocytes Absolute: 0.4 10*3/uL (ref 0.1–0.9)
Monocytes: 8 %
Neutrophils Absolute: 2.5 10*3/uL (ref 1.4–7.0)
Neutrophils: 50 %
Platelets: 357 10*3/uL (ref 150–450)
RBC: 4.06 x10E6/uL (ref 3.77–5.28)
RDW: 12.4 % (ref 11.7–15.4)
WBC: 4.9 10*3/uL (ref 3.4–10.8)

## 2020-07-30 LAB — BASIC METABOLIC PANEL
BUN/Creatinine Ratio: 31 — ABNORMAL HIGH (ref 12–28)
BUN: 24 mg/dL (ref 8–27)
CO2: 25 mmol/L (ref 20–29)
Calcium: 9.7 mg/dL (ref 8.7–10.3)
Chloride: 102 mmol/L (ref 96–106)
Creatinine, Ser: 0.78 mg/dL (ref 0.57–1.00)
GFR calc Af Amer: 84 mL/min/{1.73_m2} (ref >59)
GFR calc non Af Amer: 73 mL/min/{1.73_m2} (ref >59)
Glucose: 85 mg/dL (ref 65–99)
Potassium: 3.9 mmol/L (ref 3.5–5.2)
Sodium: 143 mmol/L (ref 134–144)

## 2020-07-30 LAB — URINALYSIS, ROUTINE W REFLEX MICROSCOPIC
Bilirubin, UA: NEGATIVE
Glucose, UA: NEGATIVE
Ketones, UA: NEGATIVE
Nitrite, UA: NEGATIVE
Specific Gravity, UA: 1.02 (ref 1.005–1.030)
Urobilinogen, Ur: 0.2 mg/dL (ref 0.2–1.0)
pH, UA: 7 (ref 5.0–7.5)

## 2020-07-30 LAB — MICROSCOPIC EXAMINATION
Bacteria, UA: NONE SEEN
RBC, Urine: >30 /hpf — AB (ref 0–2)
WBC, UA: >30 /hpf — AB (ref 0–5)

## 2020-07-31 ENCOUNTER — Other Ambulatory Visit: Payer: Self-pay

## 2020-07-31 ENCOUNTER — Encounter: Payer: Self-pay | Admitting: Urology

## 2020-07-31 ENCOUNTER — Ambulatory Visit (INDEPENDENT_AMBULATORY_CARE_PROVIDER_SITE_OTHER): Payer: Medicare Other | Admitting: Urology

## 2020-07-31 VITALS — BP 120/82 | Ht 63.0 in | Wt 116.0 lb

## 2020-07-31 DIAGNOSIS — N200 Calculus of kidney: Secondary | ICD-10-CM

## 2020-07-31 DIAGNOSIS — N132 Hydronephrosis with renal and ureteral calculous obstruction: Secondary | ICD-10-CM

## 2020-07-31 DIAGNOSIS — N201 Calculus of ureter: Secondary | ICD-10-CM

## 2020-08-02 ENCOUNTER — Encounter: Payer: Self-pay | Admitting: Urology

## 2020-08-02 NOTE — H&P (View-Only) (Signed)
07/31/2020 10:13 AM   Amanda Bruce 1940/12/25 [id number]  Referring provider: Valerie Roys, DO [place],  [place] [address]  Chief Complaint  Patient presents with  . Other    HPI: 80 y.o. female presents for hospital follow-up   Presented to [facility] ED 07/07/2020 with flank pain, tachycardia, hypotension and elevated lactic acid   Urinalysis with pyuria and CT remarkable for severe left hydronephrosis secondary to a 2 mm left distal ureteral calculus; additional 2 mm calculus slightly proximal to the distal stone  Underwent urgent stenting  Urine from the left renal pelvis grew >100,000 E. Coli  Currently doing well with occasional mild left back pain and mild stent symptoms   PMH: Past Medical History:  Diagnosis Date  . History of kidney stones    30+ years ago  . Osteoporosis     Surgical History: Past Surgical History:  Procedure Laterality Date  . CYSTOSCOPY WITH STENT PLACEMENT Left 07/08/2020   Procedure: CYSTOSCOPY WITH STENT PLACEMENT;  Surgeon: Abbie Sons, MD;  Location: [facility] ORS;  Service: Urology;  Laterality: Left;    Home Medications:  Allergies as of 07/31/2020   No Known Allergies     Medication List       Accurate as of July 31, 2020 11:59 PM. If you have any questions, ask your nurse or doctor.        ascorbic acid 500 MG tablet Commonly known as: VITAMIN C Take by mouth.   aspirin EC 81 MG tablet Take 81 mg by mouth daily.   BIOTIN PO Take 1 tablet by mouth daily.   tretinoin 0.05 % cream Commonly known as: RETIN-A Apply topically at bedtime.   VITAMIN A PO Take 1 tablet by mouth daily.   VITAMIN D PO Take by mouth.   VITAMIN E PO Take 1 tablet by mouth daily.       Allergies: No Known Allergies  Family History: Family History  Problem Relation Age of Onset  . Hypertension Mother   . Cancer Father   . Hypertension Sister   . Cancer Brother   . Breast cancer Other     Social History:   reports that she has never smoked. She has never used smokeless tobacco. She reports that she does not drink alcohol and does not use drugs.   Physical Exam: BP 120/82   Ht 5\' 3"  (1.6 m)   Wt 116 lb (52.6 kg)   BMI 20.55 kg/m   Constitutional:  Alert and oriented, No acute distress. HEENT: [place] AT, moist mucus membranes.  Trachea midline, no masses. Cardiovascular: No clubbing, cyanosis, or edema. RRR Respiratory: Normal respiratory effort, no increased work of breathing. Clear GI: Abdomen is soft, nontender, nondistended, no abdominal masses GU: No CVA tenderness Lymph: No cervical or inguinal lymphadenopathy. Skin: No rashes, bruises or suspicious lesions. Neurologic: Grossly intact, no focal deficits, moving all 4 extremities. Psychiatric: Normal mood and affect.  Pertinent Imaging:  CT Renal Stone Study  Narrative CLINICAL DATA:  Left flank pain  EXAM: CT ABDOMEN AND PELVIS WITHOUT CONTRAST  TECHNIQUE: Multidetector CT imaging of the abdomen and pelvis was performed following the standard protocol without IV contrast.  COMPARISON:  None.  FINDINGS: Lower chest: Calcifications in the lung bases compatible with old granulomatous disease. Cardiomegaly. No effusions.  Hepatobiliary: No focal hepatic abnormality. Gallbladder unremarkable.  Pancreas: No focal abnormality or ductal dilatation.  Spleen: No focal abnormality.  Normal size.  Adrenals/Urinary Tract: Severe left hydronephrosis  and hydroureter due to 2 mm distal left ureteral stone and 2 mm left UVJ stone. Bilateral nonobstructing renal stones. Upper pole cyst on the left measures 4.7 cm.  Stomach/Bowel: Moderate stool burden throughout the colon. No bowel obstruction.  Vascular/Lymphatic: No evidence of aneurysm or adenopathy.  Reproductive: Uterus and adnexa unremarkable.  No mass.  Other: No free fluid or free air.  Musculoskeletal: No acute bony abnormality.  IMPRESSION: Severe left  hydronephrosis due to 2 distal left ureteral stones, both 2 mm.  Bilateral nephrolithiasis.   Electronically Signed By: Rolm Baptise M.D. On: 07/07/2020 22:42   Assessment & Plan:    1.  Left distal ureteral calculi  Status post urgent stenting for sepsis from a urinary source  Her calculi were small and it is possible she may have passed however have recommended scheduling cystoscopy with stent removal and left ureteroscopy under anesthesia.  If the calculi are still present we discussed stone basketing/possible laser lithotripsy and the possible need for stent replacement for a brief period.  Potential risks were discussed.  Bleeding, infection and ureteral injury.  All questions were answered and she desires to schedule  Your appointment for   Abbie Sons, [facility] [address], [place], [place] [address] [phone]

## 2020-08-02 NOTE — Progress Notes (Signed)
 07/31/2020 10:13 AM   Amanda Bruce 06/28/1940 [id number]  Referring provider: Johnson, Megan P, DO [address] [place],  [place] [address]  Chief Complaint  Patient presents with  . Other    HPI: 80 y.o. female presents for hospital follow-up   Presented to [facility] ED 07/07/2020 with flank pain, tachycardia, hypotension and elevated lactic acid   Urinalysis with pyuria and CT remarkable for severe left hydronephrosis secondary to a 2 mm left distal ureteral calculus; additional 2 mm calculus slightly proximal to the distal stone  Underwent urgent stenting  Urine from the left renal pelvis grew >100,000 E. Coli  Currently doing well with occasional mild left back pain and mild stent symptoms   PMH: Past Medical History:  Diagnosis Date  . History of kidney stones    30+ years ago  . Osteoporosis     Surgical History: Past Surgical History:  Procedure Laterality Date  . CYSTOSCOPY WITH STENT PLACEMENT Left 07/08/2020   Procedure: CYSTOSCOPY WITH STENT PLACEMENT;  Surgeon: Marymargaret Kirker C, MD;  Location: [facility] ORS;  Service: Urology;  Laterality: Left;    Home Medications:  Allergies as of 07/31/2020   No Known Allergies     Medication List       Accurate as of July 31, 2020 11:59 PM. If you have any questions, ask your nurse or doctor.        ascorbic acid 500 MG tablet Commonly known as: VITAMIN C Take by mouth.   aspirin EC 81 MG tablet Take 81 mg by mouth daily.   BIOTIN PO Take 1 tablet by mouth daily.   tretinoin 0.05 % cream Commonly known as: RETIN-A Apply topically at bedtime.   VITAMIN A PO Take 1 tablet by mouth daily.   VITAMIN D PO Take by mouth.   VITAMIN E PO Take 1 tablet by mouth daily.       Allergies: No Known Allergies  Family History: Family History  Problem Relation Age of Onset  . Hypertension Mother   . Cancer Father   . Hypertension Sister   . Cancer Brother   . Breast cancer Other     Social History:   reports that she has never smoked. She has never used smokeless tobacco. She reports that she does not drink alcohol and does not use drugs.   Physical Exam: BP 120/82   Ht 5' 3" (1.6 m)   Wt 116 lb (52.6 kg)   BMI 20.55 kg/m   Constitutional:  Alert and oriented, No acute distress. HEENT: [place] AT, moist mucus membranes.  Trachea midline, no masses. Cardiovascular: No clubbing, cyanosis, or edema. RRR Respiratory: Normal respiratory effort, no increased work of breathing. Clear GI: Abdomen is soft, nontender, nondistended, no abdominal masses GU: No CVA tenderness Lymph: No cervical or inguinal lymphadenopathy. Skin: No rashes, bruises or suspicious lesions. Neurologic: Grossly intact, no focal deficits, moving all 4 extremities. Psychiatric: Normal mood and affect.  Pertinent Imaging:  CT Renal Stone Study  Narrative CLINICAL DATA:  Left flank pain  EXAM: CT ABDOMEN AND PELVIS WITHOUT CONTRAST  TECHNIQUE: Multidetector CT imaging of the abdomen and pelvis was performed following the standard protocol without IV contrast.  COMPARISON:  None.  FINDINGS: Lower chest: Calcifications in the lung bases compatible with old granulomatous disease. Cardiomegaly. No effusions.  Hepatobiliary: No focal hepatic abnormality. Gallbladder unremarkable.  Pancreas: No focal abnormality or ductal dilatation.  Spleen: No focal abnormality.  Normal size.  Adrenals/Urinary Tract: Severe left hydronephrosis   and hydroureter due to 2 mm distal left ureteral stone and 2 mm left UVJ stone. Bilateral nonobstructing renal stones. Upper pole cyst on the left measures 4.7 cm.  Stomach/Bowel: Moderate stool burden throughout the colon. No bowel obstruction.  Vascular/Lymphatic: No evidence of aneurysm or adenopathy.  Reproductive: Uterus and adnexa unremarkable.  No mass.  Other: No free fluid or free air.  Musculoskeletal: No acute bony abnormality.  IMPRESSION: Severe left  hydronephrosis due to 2 distal left ureteral stones, both 2 mm.  Bilateral nephrolithiasis.   Electronically Signed By: Rolm Baptise M.D. On: 07/07/2020 22:42   Assessment & Plan:    1.  Left distal ureteral calculi  Status post urgent stenting for sepsis from a urinary source  Her calculi were small and it is possible she may have passed however have recommended scheduling cystoscopy with stent removal and left ureteroscopy under anesthesia.  If the calculi are still present we discussed stone basketing/possible laser lithotripsy and the possible need for stent replacement for a brief period.  Potential risks were discussed.  Bleeding, infection and ureteral injury.  All questions were answered and she desires to schedule  Your appointment for   Abbie Sons, [facility] [address], [place], [place] [address] [phone]

## 2020-08-05 ENCOUNTER — Other Ambulatory Visit: Payer: Self-pay | Admitting: Radiology

## 2020-08-05 DIAGNOSIS — N201 Calculus of ureter: Secondary | ICD-10-CM

## 2020-08-05 LAB — CULTURE, URINE COMPREHENSIVE

## 2020-08-07 ENCOUNTER — Other Ambulatory Visit: Payer: Self-pay | Admitting: Radiology

## 2020-08-07 ENCOUNTER — Other Ambulatory Visit: Payer: Self-pay

## 2020-08-07 ENCOUNTER — Encounter: Payer: Self-pay | Admitting: Urology

## 2020-08-07 ENCOUNTER — Encounter
Admission: RE | Admit: 2020-08-07 | Discharge: 2020-08-07 | Disposition: A | Discharge status: Home / Self Care | Payer: Medicare Other | Source: Ambulatory Visit | Attending: Urology | Admitting: Urology

## 2020-08-07 DIAGNOSIS — N390 Urinary tract infection, site not specified: Secondary | ICD-10-CM

## 2020-08-07 DIAGNOSIS — B3749 Other urogenital candidiasis: Secondary | ICD-10-CM

## 2020-08-07 MED ORDER — FLUCONAZOLE 100 MG PO TABS: 100.0000 mg | ORAL_TABLET | Freq: Two times a day (BID) | ORAL | 0 refills | Status: DC

## 2020-08-07 MED ORDER — CIPROFLOXACIN HCL 250 MG PO TABS: 250.0000 mg | ORAL_TABLET | Freq: Two times a day (BID) | ORAL | 0 refills | Status: DC

## 2020-08-07 NOTE — Patient Instructions (Signed)
INSTRUCTIONS FOR SURGERY     Your surgery is scheduled for:   Tuesday, February 15TH     To find out your arrival time for the day of surgery,          please call [phone] between 1 pm and 3 pm on :  Monday, February 14TH     When you arrive for surgery, report to the [facility].      ONCE THEY HAVE COMPLETED THEIR PROCESS, YOU WILL GO TO THE SECOND       FLOOR AND SIGN IN AT THE SURGICAL DESK.    REMEMBER: Instructions that are not followed completely may result in serious medical risk,  up to and including death, or upon the discretion of your surgeon and anesthesiologist,            your surgery may need to be rescheduled.  __X__ 1. Do not eat food after midnight the night before your procedure.                    No gum, candy, lozenger, tic tacs, tums or hard candies.                  ABSOLUTELY NOTHING SOLID IN YOUR MOUTH AFTER MIDNIGHT                    You may drink unlimited clear liquids up to 2 hours before you are scheduled to arrive for surgery.                   Do not drink anything within those 2 hours unless you need to take medicine, then take the                   smallest amount you need.  Clear liquids include:  water, apple juice without pulp,                   any flavor Gatorade, Black coffee, black tea.  Sugar may be added but no dairy/ honey /lemon.                        Broth and jello is not considered a clear liquid.  __x__  2. On the morning of surgery, please brush your teeth with toothpaste and water. You may rinse with                  mouthwash if you wish but DO NOT SWALLOW TOOTHPASTE OR MOUTHWASH  __X___3. NO alcohol for 24 hours before or after surgery.  __x___ 4.  Do NOT smoke or use e-cigarettes for 24 HOURS PRIOR TO SURGERY.                      DO NOT Use any chewable tobacco products for at least 6 hours prior to surgery.  __x___ 5. If you start any new  medication after this appointment and prior to surgery, please                   Bring it with you on  the day of surgery.  ___x__ 6. Notify your doctor if there is any change in your medical condition, such as fever,                   infection, vomitting, diarrhea or any open sores.  __x___ 7.  USE ANTIBACTERIAL SOAP as instructed, the night before surgery and the day of surgery.                   Once you have washed with this soap, do NOT use any of the following: Powders, perfumes                    or lotions. Please do not wear make up, hairpins, clips or nail polish. You MAY  wear deodorant.                   Men may shave their face and neck.  Women need to shave 48 hours prior to surgery.                   DO NOT wear ANY jewelry on the day of surgery. If there are rings that are too tight to                    remove easily, please address this prior to the surgery day. Piercings need to be removed.                                                                     NO METAL ON YOUR BODY.                    Do NOT bring any valuables.  If you came to Pre-Admit testing then you will not need license,                     insurance card or credit card.  If you will be staying overnight, please either leave your things in                     the car or have your family be responsible for these items.                     [facility] IS NOT RESPONSIBLE FOR BELONGINGS OR VALUABLES.  ___X__ 8. DO NOT wear contact lenses on surgery day.  You may not have dentures,                     Hearing aides, contacts or glasses in the operating room. These items can be                    Placed in the Recovery Room to receive immediately after surgery.  __x___ 9. IF YOU ARE SCHEDULED TO GO HOME ON THE SAME DAY, YOU MUST                   Have someone to drive you home and to stay with you  for the first 24 hours.                    Have  an arrangement prior to arriving on surgery day.  ___x__ 10.  Take the following medications on the morning of surgery with a sip of water:                              1.  NOTHING!!                     2.  __X__  12. STOP ALL ASPIRIN PRODUCTS AS OF TODAY, February 10TH                       THIS INCLUDES BC POWDERS / GOODIES POWDER  __x___ 13. STOP Anti-inflammatories as of TODAY, February 10TH                      This includes IBUPROFEN / MOTRIN / ADVIL / ALEVE/ NAPROXYN                    YOU MAY TAKE TYLENOL ANY TIME PRIOR TO SURGERY.  __X___ 20.  Stop supplements until after surgery.                     This includes: VITAMIN A // VITAMIN C // VITAMIN E // BIOTIN // COLLAGEN                 You may continue taking Vitamin D3 but do not take on the morning of surgery.  ______18.   Wear clean and comfortable clothing to the hospital.  PLEASE WORK ON FINDING SOMEONE TO STAY OVERNIGHT WITH YOU AFTER   SURGERY.  THIS IS REQUIRED BY ANESTHESIA.  DRINK LOTS OF FLUID AFTER YOUR PROCEDURE!!

## 2020-08-07 NOTE — Patient Instructions (Signed)
INSTRUCTIONS FOR SURGERY     Your surgery is scheduled for:   Tuesday, February 15TH     To find out your arrival time for the day of surgery,          please call [phone] between 1 pm and 3 pm on :  Monday, February 14TH     When you arrive for surgery, report to the [facility].       Do NOT stop on the first floor to register.    REMEMBER: Instructions that are not followed completely may result in serious medical risk,  up to and including death, or upon the discretion of your surgeon and anesthesiologist,            your surgery may need to be rescheduled.  __X__ 1. Do not eat food after midnight the night before your procedure.                    No gum, candy, lozenger, tic tacs, tums or hard candies.                  ABSOLUTELY NOTHING SOLID IN YOUR MOUTH AFTER MIDNIGHT                    You may drink unlimited clear liquids up to 2 hours before you are scheduled to arrive for surgery.                   Do not drink anything within those 2 hours unless you need to take medicine, then take the                   smallest amount you need.  Clear liquids include:  water, apple juice without pulp,                   any flavor Gatorade, Black coffee, black tea.  Sugar may be added but no dairy/ honey /lemon.                        Broth and jello is not considered a clear liquid.  __x__  2. On the morning of surgery, please brush your teeth with toothpaste and water. You may rinse with                  mouthwash if you wish but DO NOT SWALLOW TOOTHPASTE OR MOUTHWASH  __X___3. NO alcohol for 24 hours before or after surgery.  __x___ 4.  Do NOT smoke or use e-cigarettes for 24 HOURS PRIOR TO SURGERY.                      DO NOT Use any chewable tobacco products for at least 6 hours prior to surgery.  __x___ 5. If you start any new medication after this appointment and prior to surgery, please                    Bring it with you on the day of surgery.  ___x__ 6. Notify your doctor if there is any change in  your medical condition, such as fever,                  infection, vomitting,diarrhea or any open sores.  __x___ 7.  USE ANTIBACTERIAL SOAP as instructed, the night before surgery and the day of surgery.                   Once you have washed with this soap, do NOT use any of the following: Powders, perfumes                    or lotions. Please do not wear make up, hairpins, clips or nail polish. You MAY wear deodorant.                   Men may shave their face and neck.  Women need to shave 48 hours prior to surgery.                   DO NOT wear ANY jewelry on the day of surgery. If there are rings that are too tight to                    remove easily, please address this prior to the surgery day. Piercings need to be removed.                                                                     NO METAL ON YOUR BODY.                    Do NOT bring any valuables.  If you came to Pre-Admit testing then you will not need license,                     insurance card or credit card.  If you will be staying overnight, please either leave your things in                     the car or have your family be responsible for these items.                     [facility] IS NOT RESPONSIBLE FOR BELONGINGS OR VALUABLES.  ___X__ 8. DO NOT wear contact lenses on surgery day.  You may not have dentures,                     Hearing aides, contacts or glasses in the operating room. These items can be                    Placed in the Recovery Room to receive immediately after surgery.  __x___ 9. IF YOU ARE SCHEDULED TO GO HOME ON THE SAME DAY, YOU MUST                   Have someone to drive you home and to stay with you  for the first 24 hours.                    Have an arrangement prior to arriving on surgery day.  ___x__ 10. Take the following medications on the morning of  surgery with a sip of water:                               1.  NOTHING!!                     2.              __X__  12. STOP ALL ASPIRIN PRODUCTS AS OF TODAY, February 10TH.                       THIS INCLUDES BC POWDERS / GOODIES POWDER  __x___ 13. STOP Anti-inflammatories as of today, February 10TH.                      This includes IBUPROFEN / MOTRIN / ADVIL / ALEVE/ NAPROXYN                    YOU MAY TAKE TYLENOL ANY TIME PRIOR TO SURGERY.  ___X__ 76.  Stop supplements until after surgery.                     This includes: VITAMIN C // BIOTIN // VITAMIN A // VITAMIN E // COLLAGEN                 You may continue taking Vitamin B12 / Vitamin D3 but do not take on the morning of surgery.  ______18. Wear clean and comfortable clothing to the hospital.  PLEASE WORK ON FINDING SOMEONE TO STAY OVERNIGHT WITH YOU AFTER SURGERY.  DRINK LOTS OF FLUID AFTER YOUR PROCEDURE.

## 2020-08-07 NOTE — Progress Notes (Signed)
Notified patient of results and script sent to pharmacy.

## 2020-08-08 ENCOUNTER — Other Ambulatory Visit
Admission: RE | Admit: 2020-08-08 | Discharge: 2020-08-08 | Disposition: A | Discharge status: Home / Self Care | Payer: Medicare Other | Source: Ambulatory Visit | Attending: Urology | Admitting: Urology

## 2020-08-08 ENCOUNTER — Other Ambulatory Visit: Payer: Medicare Other

## 2020-08-08 DIAGNOSIS — Z01812 Encounter for preprocedural laboratory examination: Secondary | ICD-10-CM | POA: Diagnosis not present

## 2020-08-08 DIAGNOSIS — Z20822 Contact with and (suspected) exposure to covid-19: Secondary | ICD-10-CM | POA: Insufficient documentation

## 2020-08-08 LAB — SARS CORONAVIRUS 2 (TAT 6-24 HRS): SARS Coronavirus 2: NEGATIVE

## 2020-08-10 ENCOUNTER — Encounter: Payer: Self-pay | Admitting: Family Medicine

## 2020-08-11 ENCOUNTER — Other Ambulatory Visit: Admission: RE | Admit: 2020-08-11 | Payer: Medicare Other | Source: Ambulatory Visit

## 2020-08-11 MED ORDER — CHLORHEXIDINE GLUCONATE 0.12 % MT SOLN: 15.0000 mL | Freq: Once | OROMUCOSAL | Status: AC

## 2020-08-11 MED ORDER — CEFAZOLIN SODIUM-DEXTROSE 2-4 GM/100ML-% IV SOLN
2.0000 g | INTRAVENOUS | Status: AC
  Administered 2020-08-12: 2 g via INTRAVENOUS

## 2020-08-11 MED ORDER — ORAL CARE MOUTH RINSE: 15.0000 mL | Freq: Once | OROMUCOSAL | Status: AC

## 2020-08-11 MED ORDER — LACTATED RINGERS IV SOLN: INTRAVENOUS | Status: DC

## 2020-08-11 MED ORDER — FAMOTIDINE 20 MG PO TABS: 20.0000 mg | ORAL_TABLET | Freq: Once | ORAL | Status: AC

## 2020-08-12 ENCOUNTER — Encounter
Admission: RE | Disposition: A | Discharge status: Home / Self Care | Payer: Self-pay | Source: Home / Self Care | Attending: Urology

## 2020-08-12 ENCOUNTER — Other Ambulatory Visit: Payer: Self-pay

## 2020-08-12 ENCOUNTER — Ambulatory Visit: Payer: Medicare Other | Admitting: Anesthesiology

## 2020-08-12 ENCOUNTER — Telehealth: Payer: Self-pay | Admitting: Urology

## 2020-08-12 ENCOUNTER — Encounter: Payer: Self-pay | Admitting: Urology

## 2020-08-12 ENCOUNTER — Ambulatory Visit: Payer: Medicare Other

## 2020-08-12 ENCOUNTER — Ambulatory Visit
Admission: RE | Admit: 2020-08-12 | Discharge: 2020-08-12 | Disposition: A | Discharge status: Home / Self Care | Payer: Medicare Other | Attending: Urology | Admitting: Urology

## 2020-08-12 DIAGNOSIS — N201 Calculus of ureter: Secondary | ICD-10-CM | POA: Diagnosis not present

## 2020-08-12 DIAGNOSIS — Z87442 Personal history of urinary calculi: Secondary | ICD-10-CM | POA: Diagnosis not present

## 2020-08-12 DIAGNOSIS — Z803 Family history of malignant neoplasm of breast: Secondary | ICD-10-CM | POA: Diagnosis not present

## 2020-08-12 DIAGNOSIS — Z809 Family history of malignant neoplasm, unspecified: Secondary | ICD-10-CM | POA: Insufficient documentation

## 2020-08-12 DIAGNOSIS — Z8249 Family history of ischemic heart disease and other diseases of the circulatory system: Secondary | ICD-10-CM | POA: Diagnosis not present

## 2020-08-12 DIAGNOSIS — N132 Hydronephrosis with renal and ureteral calculous obstruction: Secondary | ICD-10-CM | POA: Diagnosis present

## 2020-08-12 DIAGNOSIS — Z466 Encounter for fitting and adjustment of urinary device: Secondary | ICD-10-CM | POA: Diagnosis not present

## 2020-08-12 HISTORY — PX: CYSTOSCOPY/RETROGRADE/URETEROSCOPY: SHX5316

## 2020-08-12 SURGERY — CYSTOSCOPY/RETROGRADE/URETEROSCOPY
Anesthesia: General | Laterality: Left

## 2020-08-12 MED ORDER — ONDANSETRON HCL 4 MG/2ML IJ SOLN
INTRAMUSCULAR | Status: DC | PRN
  Administered 2020-08-12: 4 mg via INTRAVENOUS

## 2020-08-12 MED ORDER — FAMOTIDINE 20 MG PO TABS
ORAL_TABLET | ORAL | Status: AC
  Administered 2020-08-12: 20 mg via ORAL
  Filled 2020-08-12: qty 1

## 2020-08-12 MED ORDER — OXYCODONE HCL 5 MG/5ML PO SOLN
5.0000 mg | Freq: Once | ORAL | Status: DC | PRN
Start: 2020-08-12 — End: 2020-08-12

## 2020-08-12 MED ORDER — CEFAZOLIN SODIUM-DEXTROSE 2-4 GM/100ML-% IV SOLN
INTRAVENOUS | Status: AC
  Filled 2020-08-12: qty 100

## 2020-08-12 MED ORDER — FENTANYL CITRATE (PF) 100 MCG/2ML IJ SOLN
INTRAMUSCULAR | Status: DC | PRN
  Administered 2020-08-12 (×2): 50 ug via INTRAVENOUS

## 2020-08-12 MED ORDER — DEXAMETHASONE SODIUM PHOSPHATE 10 MG/ML IJ SOLN
INTRAMUSCULAR | Status: DC | PRN
  Administered 2020-08-12: 8 mg via INTRAVENOUS

## 2020-08-12 MED ORDER — PROPOFOL 10 MG/ML IV BOLUS
INTRAVENOUS | Status: AC
  Filled 2020-08-12: qty 20

## 2020-08-12 MED ORDER — FENTANYL CITRATE (PF) 100 MCG/2ML IJ SOLN
INTRAMUSCULAR | Status: AC
  Filled 2020-08-12: qty 2

## 2020-08-12 MED ORDER — PROPOFOL 10 MG/ML IV BOLUS
INTRAVENOUS | Status: DC | PRN
  Administered 2020-08-12: 120 mg via INTRAVENOUS

## 2020-08-12 MED ORDER — LIDOCAINE HCL (CARDIAC) PF 100 MG/5ML IV SOSY
PREFILLED_SYRINGE | INTRAVENOUS | Status: DC | PRN
  Administered 2020-08-12: 50 mg via INTRAVENOUS

## 2020-08-12 MED ORDER — ONDANSETRON HCL 4 MG/2ML IJ SOLN: 4.0000 mg | Freq: Once | INTRAMUSCULAR | Status: DC | PRN

## 2020-08-12 MED ORDER — IOPAMIDOL (ISOVUE-200) INJECTION 41%
INTRAVENOUS | Status: DC | PRN
  Administered 2020-08-12: 20 mL

## 2020-08-12 MED ORDER — CHLORHEXIDINE GLUCONATE 0.12 % MT SOLN
OROMUCOSAL | Status: AC
  Administered 2020-08-12: 15 mL via OROMUCOSAL
  Filled 2020-08-12: qty 15

## 2020-08-12 MED ORDER — OXYCODONE HCL 5 MG PO TABS
5.0000 mg | ORAL_TABLET | Freq: Once | ORAL | Status: DC | PRN
Start: 2020-08-12 — End: 2020-08-12

## 2020-08-12 MED ORDER — FENTANYL CITRATE (PF) 100 MCG/2ML IJ SOLN: 25.0000 ug | INTRAMUSCULAR | Status: DC | PRN

## 2020-08-12 SURGICAL SUPPLY — 33 items
BAG DRAIN CYSTO-URO LG1000N (MISCELLANEOUS) ×2 IMPLANT
BASKET ZERO TIP 1.9FR (BASKET) IMPLANT
BRUSH SCRUB EZ 1% IODOPHOR (MISCELLANEOUS) ×2 IMPLANT
BSKT STON RTRVL ZERO TP 1.9FR (BASKET)
CATH URETL 5X70 OPEN END (CATHETERS) IMPLANT
CNTNR SPEC 2.5X3XGRAD LEK (MISCELLANEOUS)
CONT SPEC 4OZ STER OR WHT (MISCELLANEOUS)
CONT SPEC 4OZ STRL OR WHT (MISCELLANEOUS)
CONTAINER SPEC 2.5X3XGRAD LEK (MISCELLANEOUS) IMPLANT
DRAPE UTILITY 15X26 TOWEL STRL (DRAPES) ×2 IMPLANT
GLOVE BIOGEL PI IND STRL 7.5 (GLOVE) ×1 IMPLANT
GLOVE BIOGEL PI INDICATOR 7.5 (GLOVE) ×1
GOWN STRL REUS W/ TWL LRG LVL3 (GOWN DISPOSABLE) ×1 IMPLANT
GOWN STRL REUS W/ TWL XL LVL3 (GOWN DISPOSABLE) ×1 IMPLANT
GOWN STRL REUS W/TWL LRG LVL3 (GOWN DISPOSABLE) ×2
GOWN STRL REUS W/TWL XL LVL3 (GOWN DISPOSABLE) ×2
GUIDEWIRE STR DUAL SENSOR (WIRE) ×2 IMPLANT
INFUSOR MANOMETER BAG 3000ML (MISCELLANEOUS) ×2 IMPLANT
INTRODUCER DILATOR DOUBLE (INTRODUCER) IMPLANT
KIT TURNOVER CYSTO (KITS) ×2 IMPLANT
MANIFOLD NEPTUNE II (INSTRUMENTS) ×1 IMPLANT
PACK CYSTO AR (MISCELLANEOUS) ×2 IMPLANT
SET CYSTO W/LG BORE CLAMP LF (SET/KITS/TRAYS/PACK) ×2 IMPLANT
SHEATH URETERAL 12FRX35CM (MISCELLANEOUS) IMPLANT
SOL .9 NS 3000ML IRR  AL (IV SOLUTION) ×1
SOL .9 NS 3000ML IRR AL (IV SOLUTION) ×1
SOL .9 NS 3000ML IRR UROMATIC (IV SOLUTION) ×1 IMPLANT
STENT URET 6FRX24 CONTOUR (STENTS) IMPLANT
STENT URET 6FRX26 CONTOUR (STENTS) IMPLANT
SURGILUBE 2OZ TUBE FLIPTOP (MISCELLANEOUS) ×2 IMPLANT
TRACTIP FLEXIVA PULSE ID 200 (Laser) ×2 IMPLANT
VALVE UROSEAL ADJ ENDO (VALVE) IMPLANT
WATER STERILE IRR 1000ML POUR (IV SOLUTION) ×2 IMPLANT

## 2020-08-12 NOTE — Interval H&P Note (Signed)
History and Physical Interval Note:  08/12/2020 7:19 AM  Amanda Bruce  has presented today for surgery, with the diagnosis of left ureteral calculus.  The various methods of treatment have been discussed with the patient and family. After consideration of risks, benefits and other options for treatment, the patient has consented to  Procedure(s): CYSTOSCOPY WITH URETEROSCOPY, STONE BASKETRY AND possible STENT Exchange (Left) CYSTOSCOPY WITH HOLMIUM LASER LITHOTRIPSY (Left) as a surgical intervention.  The patient's history has been reviewed, patient examined, no change in status, stable for surgery.  I have reviewed the patient's chart and labs.  Questions were answered to the patient's satisfaction.     [name]

## 2020-08-12 NOTE — Telephone Encounter (Signed)
-----   Message from Abbie Sons, MD sent at 08/12/2020  8:41 AM EST ----- Regarding: Follow-up appointment Please schedule postop follow-up appointment 1 month

## 2020-08-12 NOTE — Anesthesia Postprocedure Evaluation (Signed)
Anesthesia Post Note  Patient: Amanda Bruce  Procedure(s) Performed: CYSTOSCOPY WITH URETEROSCOPY, with STENT removal (Left )  Patient location during evaluation: PACU Anesthesia Type: General Level of consciousness: awake and alert Pain management: pain level controlled Vital Signs Assessment: post-procedure vital signs reviewed and stable Respiratory status: spontaneous breathing, nonlabored ventilation, respiratory function stable and patient connected to nasal cannula oxygen Cardiovascular status: blood pressure returned to baseline and stable Postop Assessment: no apparent nausea or vomiting Anesthetic complications: no   No complications documented.   Last Vitals:  Vitals:   08/12/20 0804 08/12/20 0815  BP: 139/74 (!) 153/80  Pulse: 69 70  Resp: 17 17  Temp: (!) 36.2 C   SpO2: 100% 100%    Last Pain:  Vitals:   08/12/20 0815  TempSrc:   PainSc: 0-No pain                 Arita Miss

## 2020-08-12 NOTE — Transfer of Care (Signed)
Immediate Anesthesia Transfer of Care Note  Patient: Amanda Bruce  Procedure(s) Performed: CYSTOSCOPY WITH URETEROSCOPY, with STENT removal (Left )  Patient Location: PACU  Anesthesia Type:General  Level of Consciousness: drowsy  Airway & Oxygen Therapy: Patient Spontanous Breathing and Patient connected to nasal cannula oxygen  Post-op Assessment: Report given to RN  Post vital signs: stable  Last Vitals:  Vitals Value Taken Time  BP    Temp    Pulse    Resp    SpO2      Last Pain:  Vitals:   08/12/20 0616  TempSrc: Temporal  PainSc: 0-No pain      Patients Stated Pain Goal: 0 (51/76/16 0737)  Complications: No complications documented.

## 2020-08-12 NOTE — Anesthesia Procedure Notes (Signed)
Procedure Name: LMA Insertion Date/Time: 08/12/2020 7:30 AM Performed by: Lerry Liner, CRNA Pre-anesthesia Checklist: Patient identified, Emergency Drugs available, Suction available and Patient being monitored Patient Re-evaluated:Patient Re-evaluated prior to induction Oxygen Delivery Method: Circle system utilized Preoxygenation: Pre-oxygenation with 100% oxygen Induction Type: IV induction LMA: LMA inserted LMA Size: 3.5 Number of attempts: 1 Tube secured with: Tape Dental Injury: Teeth and Oropharynx as per pre-operative assessment

## 2020-08-12 NOTE — Op Note (Addendum)
Preoperative diagnosis:  Left ureteral calculi  Postoperative diagnosis:  Passed ureteral calculi  Procedure:  Cystoscopy with stent removal Left ureteroscopy-diagnostic 3.   Left retrograde pyelography with interpretation  Surgeon: Nicki Reaper C. Anaston Koehn, M.D.  Anesthesia: General  Complications: None  Intraoperative findings:  1.  Left retrograde pyelography demonstrated no filling defects within the ureter or ureteral dilation.  No hydronephrosis.  Splaying of upper pole calyx secondary to known left renal cyst  EBL: Minimal  Specimens: Calculus fragments for analysis   Indication: NOVIE MAGGIO is a 80 y.o. female status post urgent left ureteral stent placement 07/08/2020 for 2 mm obstructing distal ureteral calculi x2 and sepsis.  Although these calculi were small and may have passed follow-up ureteroscopy was recommended.  After reviewing the management options for treatment, the patient elected to proceed with the above surgical procedure(s). We have discussed the potential benefits and risks of the procedure, side effects of the proposed treatment, the likelihood of the patient achieving the goals of the procedure, and any potential problems that might occur during the procedure or recuperation. Informed consent has been obtained.  Description of procedure:  The patient was taken to the operating room and general anesthesia was induced.  The patient was placed in the dorsal lithotomy position, prepped and draped in the usual sterile fashion, and preoperative antibiotics were administered. A preoperative time-out was performed.   A 21 French cystoscope was lubricated and passed per urethra. Panendoscopy was performed and the bladder mucosa showed no erythema, solid or papillary lesions with the exception of inflammatory changes of the left ureteral orifice secondary to her indwelling stent.  In the stent was grasped with endoscopic forceps and brought out to the urethral  meatus.  A 0.038 Sensor wire was placed through the stent and advanced to the renal pelvis under fluoroscopic guidance without difficulty.  The stent was removed.  A 4.5 Fr semirigid ureteroscope was then advanced into the left ureter next to the guidewire without difficulty and advanced proximally to the level of the UPJ and no calculi were identified.  The ureteroscope was slowly withdrawn and again no calculi were seen  The guidewire was removed and pullout retrograde pyelogram was performed through the ureteroscope with findings as described above.  There was complete emptying of the ureter noted on fluoroscopy.  Retrograde pyelogram was performed with findings as described above.  The bladder was then emptied and the cystoscope was removed.  The patient appeared to tolerate the procedure well and without complications.  After anesthetic reversal the patient was transported to the PACU in stable condition.   Plan: Office follow-up 1 month   [name], MD

## 2020-08-12 NOTE — Discharge Instructions (Signed)
AMBULATORY SURGERY  DISCHARGE INSTRUCTIONS   1) The drugs that you were given will stay in your system until tomorrow so for the next 24 hours you should not:  A) Drive an automobile B) Make any legal decisions C) Drink any alcoholic beverage   2) You may resume regular meals tomorrow.  Today it is better to start with liquids and gradually work up to solid foods.  You may eat anything you prefer, but it is better to start with liquids, then soup and crackers, and gradually work up to solid foods.   3) Please notify your doctor immediately if you have any unusual bleeding, trouble breathing, redness and pain at the surgery site, drainage, fever, or pain not relieved by medication. 4)   5) Your post-operative visit with Dr.                                     is: Date:                        Time:    Please call to schedule your post-operative visit.  6) Additional Instructions:  DISCHARGE INSTRUCTIONS FOR URETEROSCOPY  You had passed your 2 small stones.  Your stent was not replaced and your urinary symptoms should improve  MEDICATIONS:  1. Resume all your other meds from home.  2.  AZO (over-the-counter) can help with the burning/stinging when you urinate.  ACTIVITY:  1. May resume regular activities in 24 hours. 2. No driving while on narcotic pain medications  3. Drink plenty of water  4. Continue to walk at home - you can still get blood clots when you are at home, so keep active, but don't over do it.  5. May return to work/school tomorrow or when you feel ready    SIGNS/SYMPTOMS TO CALL:  Please call us if you have a fever greater than 101.5, uncontrolled nausea/vomiting, uncontrolled pain, dizziness, unable to urinate, excessively bloody urine, chest pain, shortness of breath, leg swelling, leg pain, or any other concerns or questions.   You can reach [place] at [phone].   FOLLOW-UP:  1. You will be contacted by our office for a follow appointment in  approximately 1 month

## 2020-08-12 NOTE — Telephone Encounter (Signed)
Post op app made patient is aware

## 2020-08-12 NOTE — OR Nursing (Signed)
Left  Ureteral stent removed intact and  Discarded per Surgeon

## 2020-08-12 NOTE — Anesthesia Preprocedure Evaluation (Signed)
Anesthesia Evaluation  Patient identified by MRN, date of birth, ID band Patient awake    Reviewed: Allergy & Precautions, H&P , NPO status , Patient's Chart, lab work & pertinent test results  History of Anesthesia Complications Negative for: history of anesthetic complications  Airway Mallampati: II  TM Distance: >3 FB Neck ROM: full    Dental  (+) Poor Dentition, Partial Lower, Partial Upper   Pulmonary neg pulmonary ROS, neg shortness of breath,    Pulmonary exam normal breath sounds clear to auscultation       Cardiovascular Exercise Tolerance: Good (-) angina(-) Past MI and (-) DOE negative cardio ROS Normal cardiovascular exam Rhythm:Regular Rate:Normal  TTE 2022: 1. Left ventricular ejection fraction, by estimation, is 55 to 60%. The  left ventricle has normal function. The left ventricle has no regional  wall motion abnormalities. Left ventricular diastolic parameters are  consistent with Grade I diastolic  dysfunction (impaired relaxation).  2. Right ventricular systolic function is normal. The right ventricular  size is normal. There is mildly elevated pulmonary artery systolic  pressure.  3. The mitral valve is normal in structure. Mild mitral valve  regurgitation.  4. The aortic valve is tricuspid. Aortic valve regurgitation is not  visualized.  5. The inferior vena cava is dilated in size with <50% respiratory  variability, suggesting right atrial pressure of 15 mmHg.    Neuro/Psych Seizures:    negative neurological ROS  negative psych ROS   GI/Hepatic negative GI ROS, Neg liver ROS, neg GERD  ,  Endo/Other  negative endocrine ROS  Renal/GU      Musculoskeletal  (+) Arthritis ,   Abdominal   Peds  Hematology negative hematology ROS (+)   Anesthesia Other Findings  Past Medical History: No date: History of kidney stones     Comment:  30+ years ago No date: Osteoporosis  History  reviewed. No pertinent surgical history.  BMI    Body Mass Index: 17.23 kg/m      Reproductive/Obstetrics negative OB ROS                             Anesthesia Physical  Anesthesia Plan  ASA: II  Anesthesia Plan: General   Post-op Pain Management:    Induction: Intravenous  PONV Risk Score and Plan: 4 or greater and Ondansetron, Dexamethasone and Treatment may vary due to age or medical condition  Airway Management Planned: LMA  Additional Equipment: None  Intra-op Plan:   Post-operative Plan: Extubation in OR  Informed Consent: I have reviewed the patients History and Physical, chart, labs and discussed the procedure including the risks, benefits and alternatives for the proposed anesthesia with the patient or authorized representative who has indicated his/her understanding and acceptance.     Dental Advisory Given  Plan Discussed with: Anesthesiologist, CRNA and Surgeon  Anesthesia Plan Comments: (Patient consented for risks of anesthesia including but not limited to:  - adverse reactions to medications - damage to eyes, teeth, lips or other oral mucosa - nerve damage due to positioning  - sore throat or hoarseness - Damage to heart, brain, nerves, lungs, other parts of body or loss of life  Patient voiced understanding.)        Anesthesia Quick Evaluation

## 2020-08-13 ENCOUNTER — Encounter: Payer: Self-pay | Admitting: Urology

## 2020-08-13 ENCOUNTER — Telehealth: Payer: Self-pay | Admitting: Radiology

## 2020-08-13 NOTE — Telephone Encounter (Signed)
Patient asked if she can take Tylenol for a headache following surgery on 08/12/2020 with Dr Bernardo Heater. Advised patient this is appropriate and to increase fluid intake. Patient verbalized understanding.

## 2020-09-10 ENCOUNTER — Ambulatory Visit (INDEPENDENT_AMBULATORY_CARE_PROVIDER_SITE_OTHER): Payer: Medicare Other | Admitting: Urology

## 2020-09-10 ENCOUNTER — Encounter: Payer: Self-pay | Admitting: Urology

## 2020-09-10 ENCOUNTER — Other Ambulatory Visit: Payer: Self-pay

## 2020-09-10 VITALS — BP 138/77 | HR 87 | Ht 67.0 in | Wt 120.0 lb

## 2020-09-10 DIAGNOSIS — N201 Calculus of ureter: Secondary | ICD-10-CM

## 2020-09-10 DIAGNOSIS — N200 Calculus of kidney: Secondary | ICD-10-CM | POA: Diagnosis not present

## 2020-09-10 NOTE — Progress Notes (Signed)
09/10/2020 10:57 AM   Amanda Bruce 1940/10/16 [id number]  Referring provider: Charlynne Cousins, MD [address] [place],  [place] [address]  Chief Complaint  Patient presents with  . Nephrolithiasis    HPI: 80 y.o. female presents for postop follow-up.   Prior history stent placement for obstructing 2 mm distal ureteral calculi with sepsis  Follow-up ureteroscopy 08/12/2020 showed no calculi present  Punctate bilateral renal calculi on CT  Intermittent low back pain since surgery though resolves with Tylenol   PMH: Past Medical History:  Diagnosis Date  . History of kidney stones    30+ years ago  . Osteoporosis     Surgical History: Past Surgical History:  Procedure Laterality Date  . CYSTOSCOPY WITH STENT PLACEMENT Left 07/08/2020   Procedure: CYSTOSCOPY WITH STENT PLACEMENT;  Surgeon: Abbie Sons, MD;  Location: [facility];  Service: Urology;  Laterality: Left;  . CYSTOSCOPY/RETROGRADE/URETEROSCOPY Left 08/12/2020   Procedure: CYSTOSCOPY WITH URETEROSCOPY, with STENT removal;  Surgeon: Abbie Sons, MD;  Location: [facility] OR[facility];  Service: Urology;  Laterality: Left;    Home Medications:  Allergies as of 09/10/2020   No Known Allergies     Medication List       Accurate as of September 10, 2020 10:57 AM. If you have any questions, ask your nurse or doctor.        STOP taking these medications   ciprofloxacin 250 MG tablet Commonly known as: CIPRO Stopped by: Abbie Sons, MD     TAKE these medications   acetaminophen 325 MG tablet Commonly known as: TYLENOL Take 325-650 mg by mouth every 6 (six) hours as needed (headaches.).   aspirin EC 81 MG tablet Take 81 mg by mouth daily.   Biotin 5000 MCG Tabs Take 5,000 mcg by mouth daily.   cholecalciferol 25 MCG (1000 UNIT) tablet Commonly known as: VITAMIN D3 Take 1,000 Units by mouth daily.   COLLAGEN PO Take 1,000 mg by mouth daily.   fluconazole 100 MG tablet Commonly known as: DIFLUCAN Take 1  tablet (100 mg total) by mouth every 12 (twelve) hours. X 5 days   tretinoin 0.05 % cream Commonly known as: RETIN-A Apply topically at bedtime. What changed:   how much to take  when to take this   Vitamin A 2400 MCG (8000 UT) Caps Take 2,400 mcg by mouth daily.   vitamin C 500 MG tablet Commonly known as: ASCORBIC ACID Take 500 mg by mouth daily.   Vitamin E 670 MG (1000 UT) Caps Take 670 mg by mouth daily.       Allergies: No Known Allergies  Family History: Family History  Problem Relation Age of Onset  . Hypertension Mother   . Cancer Father   . Hypertension Sister   . Cancer Brother   . Breast cancer Other     Social History:  reports that she has never smoked. She has never used smokeless tobacco. She reports that she does not drink alcohol and does not use drugs.   Physical Exam: BP 138/77   Pulse 87   Ht 5\' 7"  (1.702 m)   Wt 120 lb (54.4 kg)   BMI 18.79 kg/m   Constitutional:  Alert and oriented, No acute distress. HEENT: [place] AT, moist mucus membranes.  Trachea midline, no masses. Cardiovascular: No clubbing, cyanosis, or edema. Respiratory: Normal respiratory effort, no increased work of breathing.   Assessment & Plan:    1.  Bilateral nephrolithiasis  Recommend metabolic evaluation  to include 24 urine study/blood work  6 month follow-up with [place], MD  [facility] [address], [place], [place] [address] [phone]

## 2020-09-11 ENCOUNTER — Encounter: Payer: Self-pay | Admitting: Urology

## 2020-12-12 DIAGNOSIS — D2271 Melanocytic nevi of right lower limb, including hip: Secondary | ICD-10-CM | POA: Diagnosis not present

## 2020-12-12 DIAGNOSIS — L821 Other seborrheic keratosis: Secondary | ICD-10-CM | POA: Diagnosis not present

## 2020-12-12 DIAGNOSIS — L570 Actinic keratosis: Secondary | ICD-10-CM | POA: Diagnosis not present

## 2020-12-12 DIAGNOSIS — D2262 Melanocytic nevi of left upper limb, including shoulder: Secondary | ICD-10-CM | POA: Diagnosis not present

## 2020-12-12 DIAGNOSIS — D225 Melanocytic nevi of trunk: Secondary | ICD-10-CM | POA: Diagnosis not present

## 2020-12-12 DIAGNOSIS — D2272 Melanocytic nevi of left lower limb, including hip: Secondary | ICD-10-CM | POA: Diagnosis not present

## 2020-12-12 DIAGNOSIS — D2261 Melanocytic nevi of right upper limb, including shoulder: Secondary | ICD-10-CM | POA: Diagnosis not present

## 2020-12-12 DIAGNOSIS — X32XXXA Exposure to sunlight, initial encounter: Secondary | ICD-10-CM | POA: Diagnosis not present

## 2021-03-13 ENCOUNTER — Encounter: Payer: Self-pay | Admitting: Urology

## 2021-03-13 ENCOUNTER — Ambulatory Visit (INDEPENDENT_AMBULATORY_CARE_PROVIDER_SITE_OTHER): Payer: Medicare Other | Admitting: Urology

## 2021-03-13 ENCOUNTER — Other Ambulatory Visit: Payer: Self-pay

## 2021-03-13 ENCOUNTER — Ambulatory Visit
Admission: RE | Admit: 2021-03-13 | Discharge: 2021-03-13 | Disposition: A | Discharge status: Home / Self Care | Payer: Medicare Other | Attending: Urology | Admitting: Urology

## 2021-03-13 ENCOUNTER — Ambulatory Visit
Admission: RE | Admit: 2021-03-13 | Discharge: 2021-03-13 | Disposition: A | Discharge status: Home / Self Care | Payer: Medicare Other | Source: Ambulatory Visit | Attending: Urology | Admitting: Urology

## 2021-03-13 VITALS — BP 143/77 | HR 60 | Ht 67.0 in | Wt 123.0 lb

## 2021-03-13 DIAGNOSIS — N200 Calculus of kidney: Secondary | ICD-10-CM | POA: Insufficient documentation

## 2021-03-13 DIAGNOSIS — Z87442 Personal history of urinary calculi: Secondary | ICD-10-CM | POA: Diagnosis not present

## 2021-03-13 DIAGNOSIS — M4186 Other forms of scoliosis, lumbar region: Secondary | ICD-10-CM | POA: Diagnosis not present

## 2021-03-13 DIAGNOSIS — N2889 Other specified disorders of kidney and ureter: Secondary | ICD-10-CM | POA: Diagnosis not present

## 2021-03-13 DIAGNOSIS — M1611 Unilateral primary osteoarthritis, right hip: Secondary | ICD-10-CM | POA: Diagnosis not present

## 2021-03-13 NOTE — Progress Notes (Signed)
03/13/2021 11:14 AM   Amanda Bruce Oct 12, 1940 [id number]  Referring provider: Charlynne Cousins, MD [address] [place],  [place] [address]  Chief Complaint  Patient presents with   Nephrolithiasis    Urologic history: 1.  Nephrolithiasis Sepsis secondary to obstructing 2 mm distal ureteral calculus January 2022-urgent stent placement Ureteroscopy 07/2020 showed no ureteral calculus CT with punctate bilateral renal calculi  HPI: 80 y.o. female presents for 70-monthfollow-up.  Since her last visit she has done well and denies bothersome LUTS, dysuria No flank, abdominal or pelvic pain KUB performed today with multiple costochondral calcifications.  When compared with her KUB January 2022 no definite renal calculi are seen   PMH: Past Medical History:  Diagnosis Date   History of kidney stones    30+ years ago   Osteoporosis     Surgical History: Past Surgical History:  Procedure Laterality Date   CYSTOSCOPY WITH STENT PLACEMENT Left 07/08/2020   Procedure: CYSTOSCOPY WITH STENT PLACEMENT;  Surgeon: SAbbie Sons MD;  Location: [facility];  Service: Urology;  Laterality: Left;   CYSTOSCOPY/RETROGRADE/URETEROSCOPY Left 08/12/2020   Procedure: CYSTOSCOPY WITH URETEROSCOPY, with STENT removal;  Surgeon: SAbbie Sons MD;  Location: [facility];  Service: Urology;  Laterality: Left;    Home Medications:  Allergies as of 03/13/2021   No Known Allergies      Medication List        Accurate as of March 13, 2021 11:14 AM. If you have any questions, ask your nurse or doctor.          acetaminophen 325 MG tablet Commonly known as: TYLENOL Take 325-650 mg by mouth every 6 (six) hours as needed (headaches.).   aspirin EC 81 MG tablet Take 81 mg by mouth daily.   Biotin 5000 MCG Tabs Take 5,000 mcg by mouth daily.   cholecalciferol 25 MCG (1000 UNIT) tablet Commonly known as: VITAMIN D3 Take 1,000 Units by mouth daily.   COLLAGEN PO Take 1,000 mg by mouth  daily.   fluconazole 100 MG tablet Commonly known as: DIFLUCAN Take 1 tablet (100 mg total) by mouth every 12 (twelve) hours. X 5 days   tretinoin 0.05 % cream Commonly known as: RETIN-A Apply topically at bedtime. What changed:  how much to take when to take this   Vitamin A 2400 MCG (8000 UT) Caps Take 2,400 mcg by mouth daily.   vitamin C 500 MG tablet Commonly known as: ASCORBIC ACID Take 500 mg by mouth daily.   Vitamin E 670 MG (1000 UT) Caps Take 670 mg by mouth daily.        Allergies: No Known Allergies  Family History: Family History  Problem Relation Age of Onset   Hypertension Mother    Cancer Father    Hypertension Sister    Cancer Brother    Breast cancer Other     Social History:  reports that she has never smoked. She has never used smokeless tobacco. She reports that she does not drink alcohol and does not use drugs.   Physical Exam: BP (!) 143/77   Pulse 60   Ht '5\' 7"'$  (1.702 m)   Wt 123 lb (55.8 kg)   BMI 19.26 kg/m   Constitutional:  Alert and oriented, No acute distress. HEENT: [place] AT, moist mucus membranes.  Trachea midline, no masses. Cardiovascular: No clubbing, cyanosis, or edema. Respiratory: Normal respiratory effort, no increased work of breathing. Psychiatric: Normal mood and affect.   Pertinent Imaging: KUB  images 03/13/21 were personally viewed and interpreted    Assessment & Plan:    1.  Bilateral nephrolithiasis Punctate bilateral calculi on prior CT Asymptomatic Follow-up 1 year with [place], MD  [facility] [address], [place], [place] [address] [phone]

## 2021-03-15 ENCOUNTER — Encounter: Payer: Self-pay | Admitting: Urology

## 2021-04-01 DIAGNOSIS — Z23 Encounter for immunization: Secondary | ICD-10-CM | POA: Diagnosis not present

## 2021-12-16 DIAGNOSIS — L570 Actinic keratosis: Secondary | ICD-10-CM | POA: Diagnosis not present

## 2021-12-16 DIAGNOSIS — D2262 Melanocytic nevi of left upper limb, including shoulder: Secondary | ICD-10-CM | POA: Diagnosis not present

## 2021-12-16 DIAGNOSIS — L814 Other melanin hyperpigmentation: Secondary | ICD-10-CM | POA: Diagnosis not present

## 2021-12-16 DIAGNOSIS — X32XXXA Exposure to sunlight, initial encounter: Secondary | ICD-10-CM | POA: Diagnosis not present

## 2021-12-16 DIAGNOSIS — D225 Melanocytic nevi of trunk: Secondary | ICD-10-CM | POA: Diagnosis not present

## 2021-12-16 DIAGNOSIS — D2271 Melanocytic nevi of right lower limb, including hip: Secondary | ICD-10-CM | POA: Diagnosis not present

## 2021-12-16 DIAGNOSIS — D2261 Melanocytic nevi of right upper limb, including shoulder: Secondary | ICD-10-CM | POA: Diagnosis not present

## 2021-12-16 DIAGNOSIS — D485 Neoplasm of uncertain behavior of skin: Secondary | ICD-10-CM | POA: Diagnosis not present

## 2021-12-16 DIAGNOSIS — D2272 Melanocytic nevi of left lower limb, including hip: Secondary | ICD-10-CM | POA: Diagnosis not present

## 2021-12-28 ENCOUNTER — Encounter: Payer: Self-pay | Admitting: Nurse Practitioner

## 2021-12-28 ENCOUNTER — Ambulatory Visit: Payer: Self-pay | Admitting: *Deleted

## 2021-12-28 ENCOUNTER — Ambulatory Visit (INDEPENDENT_AMBULATORY_CARE_PROVIDER_SITE_OTHER): Payer: Medicare Other | Admitting: Nurse Practitioner

## 2021-12-28 VITALS — BP 157/76 | HR 69 | Temp 97.6°F | Wt 119.4 lb

## 2021-12-28 DIAGNOSIS — R319 Hematuria, unspecified: Secondary | ICD-10-CM | POA: Diagnosis not present

## 2021-12-28 DIAGNOSIS — N3001 Acute cystitis with hematuria: Secondary | ICD-10-CM

## 2021-12-28 LAB — URINALYSIS, ROUTINE W REFLEX MICROSCOPIC
Bilirubin, UA: NEGATIVE
Glucose, UA: NEGATIVE
Ketones, UA: NEGATIVE
Nitrite, UA: POSITIVE — AB
Specific Gravity, UA: 1.025 (ref 1.005–1.030)
Urobilinogen, Ur: 0.2 mg/dL (ref 0.2–1.0)
pH, UA: 6 (ref 5.0–7.5)

## 2021-12-28 LAB — MICROSCOPIC EXAMINATION

## 2021-12-28 MED ORDER — NITROFURANTOIN MONOHYD MACRO 100 MG PO CAPS: 100.0000 mg | ORAL_CAPSULE | Freq: Two times a day (BID) | ORAL | 0 refills | Status: DC

## 2021-12-28 NOTE — Telephone Encounter (Signed)
  Chief Complaint: blood noted in urine  Symptoms: tea colored pink urine x 1 yesterday . Back pain , hx kidney stones  Frequency: 1 day  Pertinent Negatives: Patient denies fever, no abdominal pain, no urinary frequency.  Disposition: '[]'$ ED /'[]'$ Urgent Care (no appt availability in office) / '[x]'$ Appointment(In office/virtual)/ '[]'$  [facility]/ '[]'$ Home Care/ '[]'$ Refused Recommended Disposition /'[]'$  Mobile Bus/ '[]'$  Follow-up with PCP Additional Notes:   Appt today     Reason for Disposition  Side (flank) or back pain present  Answer Assessment - Initial Assessment Questions 1. COLOR of URINE: "Describe the color of the urine."  (e.g., tea-colored, pink, red, blood clots, bloody)     Pink tea colored  2. ONSET: "When did the bleeding start?"      Yesterday  3. EPISODES: "How many times has there been blood in the urine?" or "How many times today?"     One time  4. PAIN with URINATION: "Is there any pain with passing your urine?" If Yes, ask: "How bad is the pain?"  (Scale 1-10; or mild, moderate, severe)    - MILD - complains slightly about urination hurting    - MODERATE - interferes with normal activities      - SEVERE - excruciating, unwilling or unable to urinate because of the pain      Back pain  5. FEVER: "Do you have a fever?" If Yes, ask: "What is your temperature, how was it measured, and when did it start?"     na 6. ASSOCIATED SYMPTOMS: "Are you passing urine more frequently than usual?"     No  7. OTHER SYMPTOMS: "Do you have any other symptoms?" (e.g., back/flank pain, abdominal pain, vomiting)     Back pain  8. PREGNANCY: "Is there any chance you are pregnant?" "When was your last menstrual period?"     na  Protocols used: Urine - Blood In-A-AH

## 2021-12-28 NOTE — Progress Notes (Signed)
BP (!) 157/76   Pulse 69   Temp 97.6 F (36.4 C) (Oral)   Wt 119 lb 6.4 oz (54.2 kg)   SpO2 98%   BMI 18.70 kg/m    Subjective:    Patient ID: Amanda Bruce, female    DOB: 1941-06-05, 81 y.o.   MRN: [id number]  HPI: Amanda Bruce is a 81 y.o. female  Chief Complaint  Patient presents with   Hematuria    Blood in urine yesterday per patient    URINARY SYMPTOMS Dysuria: no Urinary frequency: no Urgency: yes Small volume voids: no Symptom severity: no Urinary incontinence: no Foul odor: no Hematuria: yes- a couple of times Abdominal pain: no Back pain: yes Suprapubic pain/pressure: no Flank pain: yes Fever:  no Vomiting: no Relief with cranberry juice: no Relief with pyridium: no Status: better/worse/stable Previous urinary tract infection: no Recurrent urinary tract infection: no Sexual activity: No sexually   Relevant past medical, surgical, family and social history reviewed and updated as indicated. Interim medical history since our last visit reviewed. Allergies and medications reviewed and updated.  Review of Systems  Constitutional:  Negative for fever.  Gastrointestinal:  Negative for abdominal pain and vomiting.  Genitourinary:  Positive for hematuria and urgency. Negative for decreased urine volume, dysuria, flank pain and frequency.  Musculoskeletal:  Positive for back pain.    Per HPI unless specifically indicated above     Objective:    BP (!) 157/76   Pulse 69   Temp 97.6 F (36.4 C) (Oral)   Wt 119 lb 6.4 oz (54.2 kg)   SpO2 98%   BMI 18.70 kg/m   Wt Readings from Last 3 Encounters:  12/28/21 119 lb 6.4 oz (54.2 kg)  03/13/21 123 lb (55.8 kg)  09/10/20 120 lb (54.4 kg)    Physical Exam Vitals and nursing note reviewed.  Constitutional:      General: She is not in acute distress.    Appearance: Normal appearance. She is normal weight. She is not ill-appearing, toxic-appearing or diaphoretic.  HENT:     Head:  Normocephalic.     Right Ear: External ear normal.     Left Ear: External ear normal.     Nose: Nose normal.     Mouth/Throat:     Mouth: Mucous membranes are moist.     Pharynx: Oropharynx is clear.  Eyes:     General:        Right eye: No discharge.        Left eye: No discharge.     Extraocular Movements: Extraocular movements intact.     Conjunctiva/sclera: Conjunctivae normal.     Pupils: Pupils are equal, round, and reactive to light.  Cardiovascular:     Rate and Rhythm: Normal rate and regular rhythm.     Heart sounds: No murmur heard. Pulmonary:     Effort: Pulmonary effort is normal. No respiratory distress.     Breath sounds: Normal breath sounds. No wheezing or rales.  Abdominal:     General: Abdomen is flat. Bowel sounds are normal. There is no distension.     Palpations: Abdomen is soft.     Tenderness: There is no abdominal tenderness. There is no right CVA tenderness, left CVA tenderness or guarding.  Musculoskeletal:     Cervical back: Normal range of motion and neck supple.  Skin:    General: Skin is warm and dry.     Capillary Refill: Capillary refill takes less than 2  seconds.  Neurological:     General: No focal deficit present.     Mental Status: She is alert and oriented to person, place, and time. Mental status is at baseline.  Psychiatric:        Mood and Affect: Mood normal.        Behavior: Behavior normal.        Thought Content: Thought content normal.        Judgment: Judgment normal.     Results for orders placed or performed in visit on 12/28/21  Microscopic Examination   Urine  Result Value Ref Range   WBC, UA 11-30 (A) 0 - 5 /hpf   RBC, Urine 3-10 (A) 0 - 2 /hpf   Epithelial Cells (non renal) 0-10 0 - 10 /hpf   Mucus, UA Present (A) Not Estab.   Bacteria, UA Many (A) None seen/Few  Urinalysis, Routine w reflex microscopic  Result Value Ref Range   Specific Gravity, UA 1.025 1.005 - 1.030   pH, UA 6.0 5.0 - 7.5   Color, UA Yellow  Yellow   Appearance Ur Clear Clear   Leukocytes,UA 2+ (A) Negative   Protein,UA 1+ (A) Negative/Trace   Glucose, UA Negative Negative   Ketones, UA Negative Negative   RBC, UA 1+ (A) Negative   Bilirubin, UA Negative Negative   Urobilinogen, Ur 0.2 0.2 - 1.0 mg/dL   Nitrite, UA Positive (A) Negative   Microscopic Examination See below:       Assessment & Plan:   Problem List Items Addressed This Visit   None Visit Diagnoses     Acute cystitis with hematuria    -  Primary   Will treat with macrobid. Discussed signs and symptoms to monitor for and when to follow up. If  not improved will send to Urology due to history of stones.   Relevant Orders   Urine Culture   Hematuria, unspecified type       Relevant Orders   Urinalysis, Routine w reflex microscopic (Completed)        Follow up plan: Return in about 1 month (around 01/28/2022) for Chronic conditions with PCP.

## 2021-12-28 NOTE — Progress Notes (Signed)
Results discussed with patient during visit.

## 2021-12-31 LAB — URINE CULTURE

## 2021-12-31 NOTE — Progress Notes (Signed)
Patient already treated with macrobid. Complete course of antibiotics.

## 2022-01-28 ENCOUNTER — Encounter: Payer: Self-pay | Admitting: Physician Assistant

## 2022-01-28 ENCOUNTER — Ambulatory Visit (INDEPENDENT_AMBULATORY_CARE_PROVIDER_SITE_OTHER): Payer: Medicare Other | Admitting: Physician Assistant

## 2022-01-28 VITALS — BP 149/77 | HR 64 | Temp 98.1°F | Ht 67.01 in | Wt 121.9 lb

## 2022-01-28 DIAGNOSIS — M810 Age-related osteoporosis without current pathological fracture: Secondary | ICD-10-CM

## 2022-01-28 DIAGNOSIS — E7800 Pure hypercholesterolemia, unspecified: Secondary | ICD-10-CM

## 2022-01-28 DIAGNOSIS — G4762 Sleep related leg cramps: Secondary | ICD-10-CM | POA: Diagnosis not present

## 2022-01-28 DIAGNOSIS — R030 Elevated blood-pressure reading, without diagnosis of hypertension: Secondary | ICD-10-CM

## 2022-01-28 NOTE — Progress Notes (Deleted)
          Acute Office Visit   Patient: Amanda Bruce   DOB: 09-25-1940   81 y.o. Female  MRN: [id number] Visit Date: 01/28/2022  Today's healthcare provider: Dani Gobble Jase Himmelberger, PA-C  Introduced myself to the patient as a Journalist, newspaper and provided education on APPs in clinical practice.    No chief complaint on file.  Subjective    HPI    Medications: Outpatient Medications Prior to Visit  Medication Sig   acetaminophen (TYLENOL) 325 MG tablet Take 325-650 mg by mouth every 6 (six) hours as needed (headaches.).   aspirin EC 81 MG tablet Take 81 mg by mouth daily.   Biotin 5000 MCG TABS Take 5,000 mcg by mouth daily.   cholecalciferol (VITAMIN D3) 25 MCG (1000 UNIT) tablet Take 1,000 Units by mouth daily.   COLLAGEN PO Take 1,000 mg by mouth daily.   nitrofurantoin, macrocrystal-monohydrate, (MACROBID) 100 MG capsule Take 1 capsule (100 mg total) by mouth 2 (two) times daily.   tretinoin (RETIN-A) 0.05 % cream Apply topically at bedtime. (Patient taking differently: Apply 1 application  topically 3 (three) times a week.)   Vitamin A 2400 MCG (8000 UT) CAPS Take 2,400 mcg by mouth daily.   vitamin C (ASCORBIC ACID) 500 MG tablet Take 500 mg by mouth daily.   Vitamin E 670 MG (1000 UT) CAPS Take 670 mg by mouth daily.   No facility-administered medications prior to visit.    Review of Systems  {Labs  Heme  Chem  Endocrine  Serology  Results Review (optional):23779}   Objective    There were no vitals taken for this visit. {Show previous vital signs (optional):23777}  Physical Exam    No results found for any visits on 01/28/22.  Assessment & Plan      No follow-ups on file.

## 2022-01-28 NOTE — Assessment & Plan Note (Signed)
Chronic, ongoing She is taking Vitamin D supplement but does not appear to be taking Calcium Will order Dexa scan today as her most recent one was in 2020 Results to dictate further management Follow up as needed.

## 2022-01-28 NOTE — Patient Instructions (Addendum)
Your blood pressure was mildly elevated today.  If possible please take it at home using an electronic blood pressure cuff for the upper arm Record your blood pressure once per day and bring them back with you to your apt so we can make sure you are not developing high blood pressure.  You can buy a blood pressure cuff at any pharmacy or [employer], [employer], etc.   Incorporating a minimum of 150 minutes (20-30 minutes per day) of moderate intensity physical activity can help improve your heart health and reduce the chances of high blood pressure and other cardiovascular risks. Incorporating a heart healthy diet can also help reduce the chances of heart attack and high cholesterol.   To help with your stopped up ear I recommend using Debrox (this is an over the counter product) that you place a few drops of, into the ear once per day for about a week. The ear wax should soften and come out on its own. Do not use Qtips or anything small in the ear- this increases the risk of eardrum puncture and damage to the canal.   To schedule your Bone Density please call the [facility].  [phone]

## 2022-01-28 NOTE — Assessment & Plan Note (Signed)
Appears chronic,  Currently she is not on medication for this  Will check lipid panel today -results to dictate further management Follow up as needed

## 2022-01-28 NOTE — Progress Notes (Signed)
Established Patient Office Visit  Name: Amanda Bruce   MRN: [id number]    DOB: [birth year]   Date:01/28/2022  Today's Provider: Talitha Givens, MHS, PA-C Introduced myself to the patient as a PA-C and provided education on APPs in clinical practice.         Subjective  Chief Complaint  Chief Complaint  Patient presents with   Leg pain    Has been getting leg cramps at night.     HPI  Right leg pain Reports some cramping at night in right leg Mainly happens at night - not every night though She has tried Aspercreme and this provides mild relief     CHF Reports she was in the hospital for about 10 days with sepsis in jan 2022 This is when this diagnosis was first mentioned in chart  BP is elevated today and has been at the last few office visits She thinks she may have a cuff at home    Osteoporosis    HYPERLIPIDEMIA Hyperlipidemia status:  not taking anything for this  Satisfied with current treatment?  yes Side effects:  no Medication compliance:  not on medication  Past cholesterol meds: none Supplements: none Aspirin:  yes The ASCVD Risk score (Arnett DK, et al., 2019) failed to calculate for the following reasons:   The 2019 ASCVD risk score is only valid for ages 50 to 41 Chest pain:  yes Coronary artery disease:  no Family history CAD:  no Family history early CAD:  no   She dances 2 nights per week    Patient Active Problem List   Diagnosis Date Noted   Chronic diastolic CHF (congestive heart failure) ([place]) 10/93/2355   Acute diastolic CHF (congestive heart failure) ([place]) 07/10/2020   Protein-calorie malnutrition, severe 07/10/2020   Right leg weakness 07/08/2020   Ureteral obstruction, left 07/08/2020   Cardiomegaly 07/08/2020   Cough 07/03/2020   Memory deficit 06/14/2019   Leg cramps, sleep related 06/12/2018   Advanced care planning/counseling discussion 06/14/2017   Hypercholesteremia 06/08/2016   Osteoarthritis of both knees  06/05/2015   Osteoporosis     Past Surgical History:  Procedure Laterality Date   CYSTOSCOPY WITH STENT PLACEMENT Left 07/08/2020   Procedure: CYSTOSCOPY WITH STENT PLACEMENT;  Surgeon: Abbie Sons, MD;  Location: [facility];  Service: Urology;  Laterality: Left;   CYSTOSCOPY/RETROGRADE/URETEROSCOPY Left 08/12/2020   Procedure: CYSTOSCOPY WITH URETEROSCOPY, with STENT removal;  Surgeon: Abbie Sons, MD;  Location: [facility];  Service: Urology;  Laterality: Left;    Family History  Problem Relation Age of Onset   Hypertension Mother    Cancer Father    Hypertension Sister    Cancer Brother    Breast cancer Other     Social History   Tobacco Use   Smoking status: Never   Smokeless tobacco: Never  Substance Use Topics   Alcohol use: No     Current Outpatient Medications:    acetaminophen (TYLENOL) 325 MG tablet, Take 325-650 mg by mouth every 6 (six) hours as needed (headaches.)., Disp: , Rfl:    aspirin EC 81 MG tablet, Take 81 mg by mouth daily., Disp: , Rfl:    Biotin 5000 MCG TABS, Take 5,000 mcg by mouth daily., Disp: , Rfl:    cholecalciferol (VITAMIN D3) 25 MCG (1000 UNIT) tablet, Take 1,000 Units by mouth daily., Disp: , Rfl:    COLLAGEN PO, Take 1,000 mg by mouth daily., Disp: ,  Rfl:    nystatin cream (MYCOSTATIN), SMARTSIG:1 sparingly Topical Twice Daily, Disp: , Rfl:    tretinoin (RETIN-A) 0.05 % cream, Apply topically at bedtime. (Patient taking differently: Apply 1 application  topically 3 (three) times a week.), Disp: 45 g, Rfl: 0   Vitamin A 2400 MCG (8000 UT) CAPS, Take 2,400 mcg by mouth daily., Disp: , Rfl:    vitamin C (ASCORBIC ACID) 500 MG tablet, Take 500 mg by mouth daily., Disp: , Rfl:    Vitamin E 670 MG (1000 UT) CAPS, Take 670 mg by mouth daily., Disp: , Rfl:   No Known Allergies  I personally reviewed active problem list, medication list, allergies, health maintenance, notes from last encounter, notes from last 3 encounters, lab results with  the patient/caregiver today.   Review of Systems  Respiratory:  Negative for cough, shortness of breath and wheezing.   Cardiovascular:  Negative for chest pain, palpitations and leg swelling.  Neurological:  Negative for dizziness and headaches.      Objective  Vitals:   01/28/22 0851 01/28/22 0854  BP: (!) 149/81 (!) 149/77  Pulse: 70 64  Temp: 98.1 F (36.7 C)   TempSrc: Oral   SpO2: 99%   Weight: 121 lb 14.4 oz (55.3 kg)   Height: 5' 7.01" (1.702 m)     Body mass index is 19.09 kg/m.  Physical Exam Vitals reviewed.  Constitutional:      General: She is awake.     Appearance: Normal appearance. She is well-developed, well-groomed and normal weight.  HENT:     Head: Normocephalic and atraumatic.     Right Ear: Ear canal normal. Decreased hearing noted. There is impacted cerumen.     Left Ear: Tympanic membrane and ear canal normal. Decreased hearing noted. There is no impacted cerumen.     Mouth/Throat:     Lips: Pink.     Pharynx: Oropharynx is clear. Uvula midline. No pharyngeal swelling, oropharyngeal exudate or posterior oropharyngeal erythema.  Cardiovascular:     Rate and Rhythm: Normal rate and regular rhythm.     Pulses: Normal pulses.          Radial pulses are 2+ on the right side and 2+ on the left side.     Heart sounds: Normal heart sounds. No murmur heard.    No friction rub. No gallop.  Pulmonary:     Effort: Pulmonary effort is normal.     Breath sounds: Normal breath sounds. No decreased air movement. No decreased breath sounds, wheezing, rhonchi or rales.  Musculoskeletal:     Cervical back: Normal range of motion and neck supple.     Right lower leg: No edema.     Left lower leg: No edema.  Lymphadenopathy:     Head:     Right side of head: No submental or submandibular adenopathy.     Left side of head: No submental or submandibular adenopathy.     Upper Body:     Right upper body: No supraclavicular adenopathy.     Left upper body: No  supraclavicular adenopathy.  Neurological:     Mental Status: She is alert.     GCS: GCS eye subscore is 4. GCS verbal subscore is 5. GCS motor subscore is 6.     Cranial Nerves: No dysarthria or facial asymmetry.     Motor: No weakness, tremor or abnormal muscle tone.     Gait: Gait is intact.  Psychiatric:  Attention and Perception: Attention and perception normal.        Mood and Affect: Mood and affect normal.        Speech: Speech normal.        Behavior: Behavior normal. Behavior is cooperative.        Cognition and Memory: Cognition normal. She exhibits impaired recent memory.        Judgment: Judgment normal.      Recent Results (from the past 2160 hour(s))  Urinalysis, Routine w reflex microscopic     Status: Abnormal   Collection Time: 12/28/21 11:27 AM  Result Value Ref Range   Specific Gravity, UA 1.025 1.005 - 1.030   pH, UA 6.0 5.0 - 7.5   Color, UA Yellow Yellow   Appearance Ur Clear Clear   Leukocytes,UA 2+ (A) Negative   Protein,UA 1+ (A) Negative/Trace   Glucose, UA Negative Negative   Ketones, UA Negative Negative   RBC, UA 1+ (A) Negative   Bilirubin, UA Negative Negative   Urobilinogen, Ur 0.2 0.2 - 1.0 mg/dL   Nitrite, UA Positive (A) Negative   Microscopic Examination See below:   Microscopic Examination     Status: Abnormal   Collection Time: 12/28/21 11:27 AM   Urine  Result Value Ref Range   WBC, UA 11-30 (A) 0 - 5 /hpf   RBC, Urine 3-10 (A) 0 - 2 /hpf   Epithelial Cells (non renal) 0-10 0 - 10 /hpf   Mucus, UA Present (A) Not Estab.   Bacteria, UA Many (A) None seen/Few  Urine Culture     Status: Abnormal   Collection Time: 12/28/21 11:56 AM   Specimen: Urine   UR  Result Value Ref Range   Urine Culture, Routine Final report (A)    Organism ID, Bacteria Escherichia coli (A)     Comment: Greater than 100,000 colony forming units per mL   Antimicrobial Susceptibility Comment     Comment:       ** S = Susceptible; I = Intermediate; R  = Resistant **                    P = Positive; N = Negative             MICS are expressed in micrograms per mL    Antibiotic                 RSLT#1    RSLT#2    RSLT#3    RSLT#4 Amoxicillin/Clavulanic Acid    S Ampicillin                     R Cefepime                       S Ceftriaxone                    S Cefuroxime                     I Ciprofloxacin                  R Ertapenem                      S Gentamicin                     S Imipenem  S Levofloxacin                   R Meropenem                      S Nitrofurantoin                 S Piperacillin/Tazobactam        S Tetracycline                   S Tobramycin                     S Trimethoprim/Sulfa             S      PHQ2/9:    01/28/2022    8:57 AM 12/28/2021   11:22 AM 07/03/2020    8:27 AM 06/14/2019    9:32 AM 06/12/2018    9:58 AM  Depression screen PHQ 2/9  Decreased Interest 0 3 0 0 0  Down, Depressed, Hopeless 0 0 0 0 0  PHQ - 2 Score 0 3 0 0 0  Altered sleeping 0 0  0 0  Tired, decreased energy 0 0  0 0  Change in appetite 0 0  0 0  Feeling bad or failure about yourself  0 0  0 0  Trouble concentrating 0 0  0 0  Moving slowly or fidgety/restless 0 0  0 0  Suicidal thoughts 0 0  0 0  PHQ-9 Score 0 3  0 0  Difficult doing work/chores Not difficult at all Not difficult at all  Not difficult at all       Fall Risk:    01/28/2022    8:57 AM 12/28/2021   11:21 AM 07/03/2020    8:27 AM 06/14/2019    9:32 AM 06/12/2018    9:29 AM  Fall Risk   Falls in the past year? 0 0 0 0 0  Number falls in past yr: 0 0  0   Injury with Fall? 0 0  0   Risk for fall due to : No Fall Risks No Fall Risks     Follow up Falls evaluation completed Falls evaluation completed         Functional Status Survey:      Assessment & Plan  Problem List Items Addressed This Visit       Musculoskeletal and Integument   Osteoporosis    Chronic, ongoing She is taking Vitamin D supplement but does  not appear to be taking Calcium Will order Dexa scan today as her most recent one was in 2020 Results to dictate further management Follow up as needed.       Relevant Orders   DG Bone Density     Other   Hypercholesteremia - Primary    Appears chronic,  Currently she is not on medication for this  Will check lipid panel today -results to dictate further management Follow up as needed       Relevant Orders   Lipid Profile   Leg cramps, sleep related    Appears chronic and ongoing  Reports she is having leg cramps predominantly at night in her right leg Will check CMP today for potential electrolyte derangement  Results to dictate further management Recommend staying well hydrated and eating balanced meals and snacks throughout the day to assist with this. Follow up as needed       Relevant  Orders   Comp Met (CMET)   CBC w/Diff   Other Visit Diagnoses     Elevated BP without diagnosis of hypertension     Acute, new problem BP is elevated today and has been elevated at previous 2-3 office visits  Recommend she take BP daily at home with an arm cuff machine and record Return in 4 weeks to discuss results  Will consider sending to Cardiology to investigate CHF dx as this is not managed and appears to have come from her admission to hospital for sepsis in 2022 Follow up in 4 weeks         Return in about 4 weeks (around 02/25/2022) for Elevated BP and MMSE.   I, Anaelle Dunton E Alona Danford, PA-C, have reviewed all documentation for this visit. The documentation on 01/28/22 for the exam, diagnosis, procedures, and orders are all accurate and complete.   Talitha Givens, MHS, PA-C [facility]

## 2022-01-28 NOTE — Assessment & Plan Note (Signed)
Appears chronic and ongoing  Reports she is having leg cramps predominantly at night in her right leg Will check CMP today for potential electrolyte derangement  Results to dictate further management Recommend staying well hydrated and eating balanced meals and snacks throughout the day to assist with this. Follow up as needed

## 2022-01-29 LAB — CBC WITH DIFFERENTIAL/PLATELET
Basophils Absolute: 0.1 10*3/uL (ref 0.0–0.2)
Basos: 1 %
EOS (ABSOLUTE): 0.2 10*3/uL (ref 0.0–0.4)
Eos: 4 %
Hematocrit: 38 % (ref 34.0–46.6)
Hemoglobin: 12.9 g/dL (ref 11.1–15.9)
Immature Grans (Abs): 0 10*3/uL (ref 0.0–0.1)
Immature Granulocytes: 0 %
Lymphocytes Absolute: 1.2 10*3/uL (ref 0.7–3.1)
Lymphs: 25 %
MCH: 31.9 pg (ref 26.6–33.0)
MCHC: 33.9 g/dL (ref 31.5–35.7)
MCV: 94 fL (ref 79–97)
Monocytes Absolute: 0.5 10*3/uL (ref 0.1–0.9)
Monocytes: 9 %
Neutrophils Absolute: 3 10*3/uL (ref 1.4–7.0)
Neutrophils: 61 %
Platelets: 286 10*3/uL (ref 150–450)
RBC: 4.04 x10E6/uL (ref 3.77–5.28)
RDW: 13.6 % (ref 11.7–15.4)
WBC: 4.9 10*3/uL (ref 3.4–10.8)

## 2022-01-29 LAB — COMPREHENSIVE METABOLIC PANEL
ALT: 12 IU/L (ref 0–32)
AST: 19 IU/L (ref 0–40)
Albumin/Globulin Ratio: 1.7 (ref 1.2–2.2)
Albumin: 4.4 g/dL (ref 3.8–4.8)
Alkaline Phosphatase: 121 IU/L (ref 44–121)
BUN/Creatinine Ratio: 39 — ABNORMAL HIGH (ref 12–28)
BUN: 24 mg/dL (ref 8–27)
Bilirubin Total: 0.5 mg/dL (ref 0.0–1.2)
CO2: 25 mmol/L (ref 20–29)
Calcium: 9.2 mg/dL (ref 8.7–10.3)
Chloride: 107 mmol/L — ABNORMAL HIGH (ref 96–106)
Creatinine, Ser: 0.61 mg/dL (ref 0.57–1.00)
Globulin, Total: 2.6 g/dL (ref 1.5–4.5)
Glucose: 81 mg/dL (ref 70–99)
Potassium: 3.9 mmol/L (ref 3.5–5.2)
Sodium: 147 mmol/L — ABNORMAL HIGH (ref 134–144)
Total Protein: 7 g/dL (ref 6.0–8.5)
eGFR: 90 mL/min/{1.73_m2} (ref >59)

## 2022-01-29 LAB — LIPID PANEL
Chol/HDL Ratio: 3.1 ratio (ref 0.0–4.4)
Cholesterol, Total: 208 mg/dL — ABNORMAL HIGH (ref 100–199)
HDL: 67 mg/dL (ref >39)
LDL Chol Calc (NIH): 126 mg/dL — ABNORMAL HIGH (ref 0–99)
Triglycerides: 87 mg/dL (ref 0–149)
VLDL Cholesterol Cal: 15 mg/dL (ref 5–40)

## 2022-02-01 ENCOUNTER — Other Ambulatory Visit: Payer: Self-pay | Admitting: Family Medicine

## 2022-02-01 DIAGNOSIS — Z1231 Encounter for screening mammogram for malignant neoplasm of breast: Secondary | ICD-10-CM

## 2022-02-08 ENCOUNTER — Ambulatory Visit (INDEPENDENT_AMBULATORY_CARE_PROVIDER_SITE_OTHER): Payer: Medicare Other | Admitting: Physician Assistant

## 2022-02-08 ENCOUNTER — Ambulatory Visit
Admission: RE | Admit: 2022-02-08 | Discharge: 2022-02-08 | Disposition: A | Discharge status: Home / Self Care | Payer: Medicare Other | Source: Ambulatory Visit | Attending: Physician Assistant | Admitting: Physician Assistant

## 2022-02-08 ENCOUNTER — Encounter: Payer: Self-pay | Admitting: Physician Assistant

## 2022-02-08 ENCOUNTER — Ambulatory Visit
Admission: RE | Admit: 2022-02-08 | Discharge: 2022-02-08 | Disposition: A | Discharge status: Home / Self Care | Payer: Medicare Other | Attending: Physician Assistant | Admitting: Physician Assistant

## 2022-02-08 VITALS — BP 152/82 | HR 88 | Temp 98.5°F | Wt 121.0 lb

## 2022-02-08 DIAGNOSIS — R319 Hematuria, unspecified: Secondary | ICD-10-CM | POA: Diagnosis not present

## 2022-02-08 DIAGNOSIS — R109 Unspecified abdominal pain: Secondary | ICD-10-CM

## 2022-02-08 DIAGNOSIS — N390 Urinary tract infection, site not specified: Secondary | ICD-10-CM

## 2022-02-08 LAB — URINALYSIS, ROUTINE W REFLEX MICROSCOPIC
Specific Gravity, UA: 1.025 (ref 1.005–1.030)
pH, UA: 5.5 (ref 5.0–7.5)

## 2022-02-08 LAB — MICROSCOPIC EXAMINATION
Bacteria, UA: NONE SEEN
Epithelial Cells (non renal): NONE SEEN /hpf (ref 0–10)
RBC, Urine: >30 /hpf — ABNORMAL HIGH (ref 0–2)
WBC, UA: >30 /hpf — ABNORMAL HIGH (ref 0–5)

## 2022-02-08 NOTE — Patient Instructions (Addendum)
I would like you to go get an xray of your kidney and pelvis so we can check to make sure the stones in your kidneys are not moving  Please go to this address for the xray   [address], [place], [place] [address]  I will send off a sample of your urine for culture to check for UTI We will keep you updated of these results  Stay well hydrated and avoid holding your urine for extending periods of time  You can take Tylenol as needed for pain in your back  If you start having increased  pain, trouble urinating, fevers, chills, fatigue, please let us know.

## 2022-02-08 NOTE — Progress Notes (Signed)
Acute Office Visit   Patient: Amanda Bruce   DOB: [birth year]   81 y.o. Female  MRN: [id number] Visit Date: 02/08/2022  Today's healthcare provider: Dani Gobble Afia Messenger, PA-C  Introduced myself to the patient as a Journalist, newspaper and provided education on APPs in clinical practice.    Chief Complaint  Patient presents with   Urinary Tract Infection    Pt states she has been having some low back pain and blood in her urine for the last 4 to 5 days    Subjective    HPI HPI     Urinary Tract Infection    Additional comments: Pt states she has been having some low back pain and blood in her urine for the last 4 to 5 days       Last edited by Georgina Peer, CMA on 02/08/2022  1:46 PM.       Hematuria   Reports she has had blood in her urine and lower back pain for the past few days Denies dysuria, urinary frequency or urgency Denies abdominal pain  States she hasn't felt her best the past few days She admits to hx of nephrolithiasis -states she did have some stones remaining in her kidneys from last eval with Urology  Reports she feels like her UTI cleared up from approx one month ago States she had relief of her symptoms until this current episode started.    Medications: Outpatient Medications Prior to Visit  Medication Sig   acetaminophen (TYLENOL) 325 MG tablet Take 325-650 mg by mouth every 6 (six) hours as needed (headaches.).   aspirin EC 81 MG tablet Take 81 mg by mouth daily.   Biotin 5000 MCG TABS Take 5,000 mcg by mouth daily.   cholecalciferol (VITAMIN D3) 25 MCG (1000 UNIT) tablet Take 1,000 Units by mouth daily.   COLLAGEN PO Take 1,000 mg by mouth daily.   nystatin cream (MYCOSTATIN) SMARTSIG:1 sparingly Topical Twice Daily   tretinoin (RETIN-A) 0.05 % cream Apply topically at bedtime. (Patient taking differently: Apply 1 application  topically 3 (three) times a week.)   Vitamin A 2400 MCG (8000 UT) CAPS Take 2,400 mcg by mouth daily.   vitamin C  (ASCORBIC ACID) 500 MG tablet Take 500 mg by mouth daily.   Vitamin E 670 MG (1000 UT) CAPS Take 670 mg by mouth daily.   No facility-administered medications prior to visit.    Review of Systems  Constitutional:  Negative for chills, fatigue and fever.  Genitourinary:  Positive for flank pain and hematuria. Negative for decreased urine volume, difficulty urinating, dysuria and frequency.       Objective    BP (!) 152/82   Pulse 88   Temp 98.5 F (36.9 C) (Oral)   Wt 121 lb (54.9 kg)   SpO2 96%   BMI 18.95 kg/m    Physical Exam Vitals reviewed.  Constitutional:      Appearance: Normal appearance. She is normal weight.  HENT:     Head: Normocephalic and atraumatic.  Cardiovascular:     Rate and Rhythm: Normal rate and regular rhythm.     Pulses: Normal pulses.          Radial pulses are 2+ on the right side and 2+ on the left side.     Heart sounds: Normal heart sounds. No murmur heard.    No friction rub. No gallop.  Pulmonary:     Effort: Pulmonary effort is  normal.     Breath sounds: Normal breath sounds.  Abdominal:     General: Abdomen is flat. Bowel sounds are normal.     Palpations: Abdomen is soft.     Tenderness: There is no abdominal tenderness. There is left CVA tenderness. There is no right CVA tenderness.  Musculoskeletal:     Right lower leg: No edema.     Left lower leg: No edema.  Neurological:     Mental Status: She is alert.  Psychiatric:        Attention and Perception: Attention and perception normal.        Mood and Affect: Mood and affect normal.        Speech: Speech normal.        Behavior: Behavior normal. Behavior is cooperative.     No results found for any visits on 02/08/22.  Assessment & Plan      No follow-ups on file.      Problem List Items Addressed This Visit   None Visit Diagnoses     Hematuria, unspecified type    -  Primary Acute, new concern Reports she noticed hematuria and flank pain over the last few  days Was treated for UTI in July and reports resolution of symptoms. She has hx of kidney stones as well- followed by Urology for this and has follow up scheduled next month Given hematuria and flank pain will order KUB and urine culture to rule out these etiologies UA and mico demonstrated many bacteria and gross hematuria  Recommend she stay well hydrated and avoid holding urine for prolonged period pending results  Follow up as needed     Relevant Orders   Urinalysis, Routine w reflex microscopic   DG Abd 1 View   Urine Culture   Flank pain     See A&P for hematuria.  May use Tylenol as needed for pain     Relevant Orders   DG Abd 1 View   Urine Culture        No follow-ups on file.   I, Emersyn Kotarski E Baileigh Modisette, PA-C, have reviewed all documentation for this visit. The documentation on 02/08/22 for the exam, diagnosis, procedures, and orders are all accurate and complete.   Talitha Givens, MHS, PA-C [facility]

## 2022-02-11 LAB — URINE CULTURE

## 2022-02-11 MED ORDER — SULFAMETHOXAZOLE-TRIMETHOPRIM 800-160 MG PO TABS: 1.0000 | ORAL_TABLET | Freq: Two times a day (BID) | ORAL | 0 refills | Status: AC

## 2022-02-11 NOTE — Addendum Note (Signed)
Addended by: Talitha Givens on: 02/11/2022 12:40 PM   Modules accepted: Orders

## 2022-02-18 ENCOUNTER — Ambulatory Visit
Admission: RE | Admit: 2022-02-18 | Discharge: 2022-02-18 | Disposition: A | Discharge status: Home / Self Care | Payer: Medicare Other | Source: Ambulatory Visit | Attending: Physician Assistant | Admitting: Physician Assistant

## 2022-02-18 ENCOUNTER — Ambulatory Visit
Admission: RE | Admit: 2022-02-18 | Discharge: 2022-02-18 | Disposition: A | Discharge status: Home / Self Care | Payer: Medicare Other | Source: Ambulatory Visit | Attending: Family Medicine | Admitting: Family Medicine

## 2022-02-18 DIAGNOSIS — M810 Age-related osteoporosis without current pathological fracture: Secondary | ICD-10-CM | POA: Insufficient documentation

## 2022-02-18 DIAGNOSIS — Z1231 Encounter for screening mammogram for malignant neoplasm of breast: Secondary | ICD-10-CM | POA: Insufficient documentation

## 2022-02-18 DIAGNOSIS — Z780 Asymptomatic menopausal state: Secondary | ICD-10-CM | POA: Diagnosis not present

## 2022-02-19 ENCOUNTER — Encounter: Payer: Self-pay | Admitting: Family Medicine

## 2022-02-23 ENCOUNTER — Ambulatory Visit: Payer: Medicare Other | Admitting: Physician Assistant

## 2022-02-24 ENCOUNTER — Ambulatory Visit: Payer: Medicare Other | Admitting: Physician Assistant

## 2022-02-24 ENCOUNTER — Ambulatory Visit (INDEPENDENT_AMBULATORY_CARE_PROVIDER_SITE_OTHER): Payer: Medicare Other | Admitting: Physician Assistant

## 2022-02-24 ENCOUNTER — Encounter: Payer: Self-pay | Admitting: Physician Assistant

## 2022-02-24 VITALS — BP 162/80 | HR 66 | Temp 98.1°F | Ht 67.01 in | Wt 120.7 lb

## 2022-02-24 DIAGNOSIS — R413 Other amnesia: Secondary | ICD-10-CM | POA: Diagnosis not present

## 2022-02-24 DIAGNOSIS — I10 Essential (primary) hypertension: Secondary | ICD-10-CM

## 2022-02-24 MED ORDER — AMLODIPINE BESYLATE 5 MG PO TABS: 5.0000 mg | ORAL_TABLET | Freq: Every day | ORAL | 0 refills | Status: DC

## 2022-02-24 NOTE — Patient Instructions (Signed)
I am starting you on a medication called Amlodipine 5 mg  Please take this once per day by mouth  Make sure you are checking your blood pressure daily and bring the records back to [place] so we can make sure your blood pressure is well controlled on this medication  Let us know if you have any side effects or concerns while taking the medication.

## 2022-02-24 NOTE — Assessment & Plan Note (Addendum)
Newly diagnosed  Will start Amlodipine 5 mg PO QD  Recommend she check BP at least every other day to monitor  Encouraged her to remain active and continue dancing, yardwork, etc.  Follow up in 6 weeks to discuss BP and response to Amlodipine

## 2022-02-24 NOTE — Assessment & Plan Note (Signed)
Ongoing concerns for memory deficit based on interactions with her during apts Completed MMSE today = 25/30 which is overall normal  Discussed importance of this with patient as she was very concerned and anxious about her memory.  Recommend continued repeat testing every 6-12 months for monitoring

## 2022-02-24 NOTE — Progress Notes (Signed)
Established Patient Office Visit  Name: Amanda Bruce   MRN: [id number]    DOB: [birth year]   Date:02/24/2022  Today's Provider: Talitha Givens, MHS, PA-C Introduced myself to the patient as a PA-C and provided education on APPs in clinical practice.         Subjective  Chief Complaint  Chief Complaint  Patient presents with   Hyperlipidemia   Dementia    HPI   Elevated BP   Readings at home: states she bought a BP machine at home and was going to bring it with her so we could show her how to use it but forgot it  Discussed that previous BP readings have been elevated significantly and we should start medication at this time She is amenable to this today  Discussed memory issues and her concerns around MMSE testing After explaining the importance of getting her baseline and tracking changes to monitor for dementia and cognitive decline she was amenable to MMSE completion  MMSE was 25/30     Patient Active Problem List   Diagnosis Date Noted   Primary hypertension 02/24/2022   Chronic diastolic CHF (congestive heart failure) ([place]) 32/95/1884   Acute diastolic CHF (congestive heart failure) ([place]) 07/10/2020   Protein-calorie malnutrition, severe 07/10/2020   Right leg weakness 07/08/2020   Ureteral obstruction, left 07/08/2020   Cardiomegaly 07/08/2020   Cough 07/03/2020   Memory deficit 06/14/2019   Leg cramps, sleep related 06/12/2018   Advanced care planning/counseling discussion 06/14/2017   Hypercholesteremia 06/08/2016   Osteoarthritis of both knees 06/05/2015   Osteoporosis     Past Surgical History:  Procedure Laterality Date   CYSTOSCOPY WITH STENT PLACEMENT Left 07/08/2020   Procedure: CYSTOSCOPY WITH STENT PLACEMENT;  Surgeon: Abbie Sons, MD;  Location: [facility];  Service: Urology;  Laterality: Left;   CYSTOSCOPY/RETROGRADE/URETEROSCOPY Left 08/12/2020   Procedure: CYSTOSCOPY WITH URETEROSCOPY, with STENT removal;  Surgeon: Abbie Sons, MD;  Location: [facility];  Service: Urology;  Laterality: Left;    Family History  Problem Relation Age of Onset   Hypertension Mother    Cancer Father    Hypertension Sister    Cancer Brother    Breast cancer Other     Social History   Tobacco Use   Smoking status: Never   Smokeless tobacco: Never  Substance Use Topics   Alcohol use: No     Current Outpatient Medications:    acetaminophen (TYLENOL) 325 MG tablet, Take 325-650 mg by mouth every 6 (six) hours as needed (headaches.)., Disp: , Rfl:    amLODipine (NORVASC) 5 MG tablet, Take 1 tablet (5 mg total) by mouth daily., Disp: 60 tablet, Rfl: 0   aspirin EC 81 MG tablet, Take 81 mg by mouth daily., Disp: , Rfl:    Biotin 5000 MCG TABS, Take 5,000 mcg by mouth daily., Disp: , Rfl:    cholecalciferol (VITAMIN D3) 25 MCG (1000 UNIT) tablet, Take 1,000 Units by mouth daily., Disp: , Rfl:    COLLAGEN PO, Take 1,000 mg by mouth daily., Disp: , Rfl:    nystatin cream (MYCOSTATIN), SMARTSIG:1 sparingly Topical Twice Daily, Disp: , Rfl:    tretinoin (RETIN-A) 0.05 % cream, Apply topically at bedtime. (Patient taking differently: Apply 1 application  topically 3 (three) times a week.), Disp: 45 g, Rfl: 0   Vitamin A 2400 MCG (8000 UT) CAPS, Take 2,400 mcg by mouth daily., Disp: , Rfl:    vitamin C (  ASCORBIC ACID) 500 MG tablet, Take 500 mg by mouth daily., Disp: , Rfl:    Vitamin E 670 MG (1000 UT) CAPS, Take 670 mg by mouth daily., Disp: , Rfl:   No Known Allergies  I personally reviewed active problem list, medication list, allergies, health maintenance, notes from last encounter, lab results with the patient/caregiver today.   Review of Systems  Eyes:  Negative for blurred vision and double vision.  Respiratory:  Negative for shortness of breath and wheezing.   Cardiovascular:  Negative for chest pain, palpitations and leg swelling.  Neurological:  Negative for dizziness and headaches.      Objective  Vitals:    02/24/22 0943 02/24/22 0946  BP: (!) 163/79 (!) 162/80  Pulse: 65 66  Temp: 98.1 F (36.7 C)   TempSrc: Oral   SpO2: 95%   Weight: 120 lb 11.2 oz (54.7 kg)   Height: 5' 7.01" (1.702 m)     Body mass index is 18.9 kg/m.  Physical Exam Vitals reviewed.  Constitutional:      General: She is awake.     Appearance: Normal appearance. She is well-developed, well-groomed and normal weight.  HENT:     Head: Normocephalic and atraumatic.  Pulmonary:     Effort: Pulmonary effort is normal.  Musculoskeletal:     Cervical back: Normal range of motion and neck supple.  Neurological:     General: No focal deficit present.     Mental Status: She is alert and oriented to person, place, and time.     GCS: GCS eye subscore is 4. GCS verbal subscore is 5. GCS motor subscore is 6.     Cranial Nerves: No dysarthria or facial asymmetry.  Psychiatric:        Attention and Perception: Attention and perception normal.        Mood and Affect: Affect normal. Mood is anxious.        Speech: Speech normal.        Behavior: Behavior normal. Behavior is cooperative.        Thought Content: Thought content normal.        Cognition and Memory: Cognition and memory normal.      Recent Results (from the past 2160 hour(s))  Urinalysis, Routine w reflex microscopic     Status: Abnormal   Collection Time: 12/28/21 11:27 AM  Result Value Ref Range   Specific Gravity, UA 1.025 1.005 - 1.030   pH, UA 6.0 5.0 - 7.5   Color, UA Yellow Yellow   Appearance Ur Clear Clear   Leukocytes,UA 2+ (A) Negative   Protein,UA 1+ (A) Negative/Trace   Glucose, UA Negative Negative   Ketones, UA Negative Negative   RBC, UA 1+ (A) Negative   Bilirubin, UA Negative Negative   Urobilinogen, Ur 0.2 0.2 - 1.0 mg/dL   Nitrite, UA Positive (A) Negative   Microscopic Examination See below:   Microscopic Examination     Status: Abnormal   Collection Time: 12/28/21 11:27 AM   Urine  Result Value Ref Range   WBC, UA  11-30 (A) 0 - 5 /hpf   RBC, Urine 3-10 (A) 0 - 2 /hpf   Epithelial Cells (non renal) 0-10 0 - 10 /hpf   Mucus, UA Present (A) Not Estab.   Bacteria, UA Many (A) None seen/Few  Urine Culture     Status: Abnormal   Collection Time: 12/28/21 11:56 AM   Specimen: Urine   UR  Result Value Ref Range  Urine Culture, Routine Final report (A)    Organism ID, Bacteria Escherichia coli (A)     Comment: Greater than 100,000 colony forming units per mL   Antimicrobial Susceptibility Comment     Comment:       ** S = Susceptible; I = Intermediate; R = Resistant **                    P = Positive; N = Negative             MICS are expressed in micrograms per mL    Antibiotic                 RSLT#1    RSLT#2    RSLT#3    RSLT#4 Amoxicillin/Clavulanic Acid    S Ampicillin                     R Cefepime                       S Ceftriaxone                    S Cefuroxime                     I Ciprofloxacin                  R Ertapenem                      S Gentamicin                     S Imipenem                       S Levofloxacin                   R Meropenem                      S Nitrofurantoin                 S Piperacillin/Tazobactam        S Tetracycline                   S Tobramycin                     S Trimethoprim/Sulfa             S   Lipid Profile     Status: Abnormal   Collection Time: 01/28/22  9:29 AM  Result Value Ref Range   Cholesterol, Total 208 (H) 100 - 199 mg/dL   Triglycerides 87 0 - 149 mg/dL   HDL 67 >39 mg/dL   VLDL Cholesterol Cal 15 5 - 40 mg/dL   LDL Chol Calc (NIH) 126 (H) 0 - 99 mg/dL   Chol/HDL Ratio 3.1 0.0 - 4.4 ratio    Comment:                                   T. Chol/HDL Ratio  Men  Women                               1/2 Avg.Risk  3.4    3.3                                   Avg.Risk  5.0    4.4                                2X Avg.Risk  9.6    7.1                                3X Avg.Risk 23.4    11.0   Comp Met (CMET)     Status: Abnormal   Collection Time: 01/28/22  9:29 AM  Result Value Ref Range   Glucose 81 70 - 99 mg/dL   BUN 24 8 - 27 mg/dL   Creatinine, Ser 0.61 0.57 - 1.00 mg/dL   eGFR 90 >59 mL/min/1.73   BUN/Creatinine Ratio 39 (H) 12 - 28   Sodium 147 (H) 134 - 144 mmol/L   Potassium 3.9 3.5 - 5.2 mmol/L   Chloride 107 (H) 96 - 106 mmol/L   CO2 25 20 - 29 mmol/L   Calcium 9.2 8.7 - 10.3 mg/dL   Total Protein 7.0 6.0 - 8.5 g/dL   Albumin 4.4 3.8 - 4.8 g/dL   Globulin, Total 2.6 1.5 - 4.5 g/dL   Albumin/Globulin Ratio 1.7 1.2 - 2.2   Bilirubin Total 0.5 0.0 - 1.2 mg/dL   Alkaline Phosphatase 121 44 - 121 IU/L   AST 19 0 - 40 IU/L   ALT 12 0 - 32 IU/L  CBC w/Diff     Status: None   Collection Time: 01/28/22  9:29 AM  Result Value Ref Range   WBC 4.9 3.4 - 10.8 x10E3/uL   RBC 4.04 3.77 - 5.28 x10E6/uL   Hemoglobin 12.9 11.1 - 15.9 g/dL   Hematocrit 38.0 34.0 - 46.6 %   MCV 94 79 - 97 fL   MCH 31.9 26.6 - 33.0 pg   MCHC 33.9 31.5 - 35.7 g/dL   RDW 13.6 11.7 - 15.4 %   Platelets 286 150 - 450 x10E3/uL   Neutrophils 61 Not Estab. %   Lymphs 25 Not Estab. %   Monocytes 9 Not Estab. %   Eos 4 Not Estab. %   Basos 1 Not Estab. %   Neutrophils Absolute 3.0 1.4 - 7.0 x10E3/uL   Lymphocytes Absolute 1.2 0.7 - 3.1 x10E3/uL   Monocytes Absolute 0.5 0.1 - 0.9 x10E3/uL   EOS (ABSOLUTE) 0.2 0.0 - 0.4 x10E3/uL   Basophils Absolute 0.1 0.0 - 0.2 x10E3/uL   Immature Granulocytes 0 Not Estab. %   Immature Grans (Abs) 0.0 0.0 - 0.1 x10E3/uL  Urinalysis, Routine w reflex microscopic     Status: Abnormal   Collection Time: 02/08/22  1:49 PM  Result Value Ref Range   Specific Gravity, UA 1.025 1.005 - 1.030   pH, UA 5.5 5.0 - 7.5   Color, UA Red (A) Yellow    Comment: Unable to read or assay due to color interference.   Appearance Ur Turbid (A) Clear   Protein,UA CANCELED  Comment: Test not performed  Result canceled by the ancillary.    Glucose, UA CANCELED      Comment: Test not performed  Result canceled by the ancillary.    Ketones, UA CANCELED     Comment: Test not performed  Result canceled by the ancillary.    Microscopic Examination See below:   Microscopic Examination     Status: Abnormal   Collection Time: 02/08/22  1:49 PM   Urine  Result Value Ref Range   WBC, UA >30 (H) 0 - 5 /hpf   RBC, Urine >30 (H) 0 - 2 /hpf   Epithelial Cells (non renal) None seen 0 - 10 /hpf   Bacteria, UA None seen None seen/Few  Urine Culture     Status: Abnormal   Collection Time: 02/08/22  4:35 PM   Specimen: Urine   UR  Result Value Ref Range   Urine Culture, Routine Final report (A)    Organism ID, Bacteria Escherichia coli (A)     Comment: Greater than 100,000 colony forming units per mL   Antimicrobial Susceptibility Comment     Comment:       ** S = Susceptible; I = Intermediate; R = Resistant **                    P = Positive; N = Negative             MICS are expressed in micrograms per mL    Antibiotic                 RSLT#1    RSLT#2    RSLT#3    RSLT#4 Amoxicillin/Clavulanic Acid    S Ampicillin                     R Cefepime                       S Ceftriaxone                    S Cefuroxime                     I Ciprofloxacin                  R Ertapenem                      S Gentamicin                     S Imipenem                       S Levofloxacin                   R Meropenem                      S Nitrofurantoin                 S Piperacillin/Tazobactam        S Tetracycline                   S Tobramycin                     S Trimethoprim/Sulfa             S  PHQ2/9:    02/08/2022    1:49 PM 01/28/2022    8:57 AM 12/28/2021   11:22 AM 07/03/2020    8:27 AM 06/14/2019    9:32 AM  Depression screen PHQ 2/9  Decreased Interest 0 0 3 0 0  Down, Depressed, Hopeless 0 0 0 0 0  PHQ - 2 Score 0 0 3 0 0  Altered sleeping 0 0 0  0  Tired, decreased energy 0 0 0  0  Change in appetite 0 0 0  0  Feeling bad  or failure about yourself  0 0 0  0  Trouble concentrating 0 0 0  0  Moving slowly or fidgety/restless 0 0 0  0  Suicidal thoughts 0 0 0  0  PHQ-9 Score 0 0 3  0  Difficult doing work/chores Not difficult at all Not difficult at all Not difficult at all  Not difficult at all      Fall Risk:    02/08/2022    1:49 PM 01/28/2022    8:57 AM 12/28/2021   11:21 AM 07/03/2020    8:27 AM 06/14/2019    9:32 AM  Fall Risk   Falls in the past year? 0 0 0 0 0  Number falls in past yr: 0 0 0  0  Injury with Fall? 0 0 0  0  Risk for fall due to : No Fall Risks No Fall Risks No Fall Risks    Follow up Falls evaluation completed Falls evaluation completed Falls evaluation completed        Functional Status Survey:      Assessment & Plan  Problem List Items Addressed This Visit       Cardiovascular and Mediastinum   Primary hypertension - Primary    Newly diagnosed  Will start Amlodipine 5 mg PO QD  Recommend she check BP at least every other day to monitor  Encouraged her to remain active and continue dancing, yardwork, etc.  Follow up in 6 weeks to discuss BP and response to Amlodipine        Relevant Medications   amLODipine (NORVASC) 5 MG tablet     Other   Memory deficit    Ongoing concerns for memory deficit based on interactions with her during apts Completed MMSE today = 25/30 which is overall normal  Discussed importance of this with patient as she was very concerned and anxious about her memory.  Recommend continued repeat testing every 6-12 months for monitoring         Return in about 6 weeks (around 04/07/2022) for HTN.   I, Assunta Pupo E Shaima Sardinas, PA-C, have reviewed all documentation for this visit. The documentation on 02/24/22 for the exam, diagnosis, procedures, and orders are all accurate and complete.   Talitha Givens, MHS, PA-C [facility]

## 2022-03-12 ENCOUNTER — Other Ambulatory Visit: Payer: Self-pay | Admitting: *Deleted

## 2022-03-12 DIAGNOSIS — N200 Calculus of kidney: Secondary | ICD-10-CM

## 2022-03-15 ENCOUNTER — Ambulatory Visit (INDEPENDENT_AMBULATORY_CARE_PROVIDER_SITE_OTHER): Payer: Medicare Other | Admitting: Urology

## 2022-03-15 ENCOUNTER — Encounter: Payer: Self-pay | Admitting: Urology

## 2022-03-15 VITALS — BP 153/83 | HR 85 | Ht 67.0 in | Wt 124.0 lb

## 2022-03-15 DIAGNOSIS — N200 Calculus of kidney: Secondary | ICD-10-CM | POA: Diagnosis not present

## 2022-03-15 NOTE — Addendum Note (Signed)
Addended by: Chrystie Nose on: 03/15/2022 02:02 PM   Modules accepted: Orders

## 2022-03-15 NOTE — Progress Notes (Signed)
03/15/2022 11:02 AM   Amanda Bruce 1940/12/11 [id number]  Referring provider: Charlynne Cousins, MD [address] [place],  [place] [address]  Chief Complaint  Patient presents with   Nephrolithiasis    Urologic history: 1.  Nephrolithiasis Sepsis secondary to obstructing 2 mm distal ureteral calculus January 2022-urgent stent placement Ureteroscopy 07/2020 showed no ureteral calculus CT with punctate bilateral renal calculi  HPI: 81 y.o. female presents for annual follow-up.  Saw her PCP 02/08/2022 complaining of low back pain and intermittent hematuria x4-5 days Had been treated for a UTI July 2023 with urine culture positive E. Coli UA 02/08/2022 with >30 RBC/>30 WBC.  Urine culture + E. Coli Symptoms resolved with antibiotic therapy and she is presently asymptomatic 8 KUB was ordered which showed bilateral renal calculi.  Question of a small calculus in the vicinity of the mid ureter Presently asymptomatic   PMH: Past Medical History:  Diagnosis Date   History of kidney stones    30+ years ago   Osteoporosis     Surgical History: Past Surgical History:  Procedure Laterality Date   CYSTOSCOPY WITH STENT PLACEMENT Left 07/08/2020   Procedure: CYSTOSCOPY WITH STENT PLACEMENT;  Surgeon: Abbie Sons, MD;  Location: [facility];  Service: Urology;  Laterality: Left;   CYSTOSCOPY/RETROGRADE/URETEROSCOPY Left 08/12/2020   Procedure: CYSTOSCOPY WITH URETEROSCOPY, with STENT removal;  Surgeon: Abbie Sons, MD;  Location: [facility];  Service: Urology;  Laterality: Left;    Home Medications:  Allergies as of 03/15/2022   No Known Allergies      Medication List        Accurate as of March 15, 2022 11:02 AM. If you have any questions, ask your nurse or doctor.          acetaminophen 325 MG tablet Commonly known as: TYLENOL Take 325-650 mg by mouth every 6 (six) hours as needed (headaches.).   amLODipine 5 MG tablet Commonly known as: NORVASC Take 1  tablet (5 mg total) by mouth daily.   ascorbic acid 500 MG tablet Commonly known as: VITAMIN C Take 500 mg by mouth daily.   aspirin EC 81 MG tablet Take 81 mg by mouth daily.   Biotin 5000 MCG Tabs Take 5,000 mcg by mouth daily.   cholecalciferol 25 MCG (1000 UNIT) tablet Commonly known as: VITAMIN D3 Take 1,000 Units by mouth daily.   COLLAGEN PO Take 1,000 mg by mouth daily.   nystatin cream Commonly known as: MYCOSTATIN SMARTSIG:1 sparingly Topical Twice Daily   tretinoin 0.05 % cream Commonly known as: RETIN-A Apply topically at bedtime. What changed:  how much to take when to take this   Vitamin A 2400 MCG (8000 UT) Caps Take 2,400 mcg by mouth daily.   Vitamin E 670 MG (1000 UT) Caps Take 670 mg by mouth daily.        Allergies: No Known Allergies  Family History: Family History  Problem Relation Age of Onset   Hypertension Mother    Cancer Father    Hypertension Sister    Cancer Brother    Breast cancer Other     Social History:  reports that she has never smoked. She has never used smokeless tobacco. She reports that she does not drink alcohol and does not use drugs.   Physical Exam: BP (!) 153/83   Pulse 85   Ht '5\' 7"'$  (1.702 m)   Wt 124 lb (56.2 kg)   BMI 19.42 kg/m   Constitutional:  Alert and  oriented, No acute distress. HEENT: [place] AT, moist mucus membranes.  Trachea midline, no masses. Cardiovascular: No clubbing, cyanosis, or edema. Respiratory: Normal respiratory effort, no increased work of breathing. Psychiatric: Normal mood and affect.   Pertinent Imaging: KUB images 03/13/21 were personally viewed and interpreted    Assessment & Plan:    1.  Bilateral nephrolithiasis We will calcifications on KUB Repeat UA ordered CTU/cystoscopy for persistent microhematuria   Abbie Sons, MD  [facility] [address], [place], [place] [address] [phone]

## 2022-03-15 NOTE — Addendum Note (Signed)
Addended by: Chrystie Nose on: 03/15/2022 01:05 PM   Modules accepted: Orders

## 2022-03-15 NOTE — Addendum Note (Signed)
Addended by: Chrystie Nose on: 03/15/2022 04:06 PM   Modules accepted: Orders

## 2022-03-16 ENCOUNTER — Other Ambulatory Visit: Payer: Medicare Other

## 2022-03-16 DIAGNOSIS — N200 Calculus of kidney: Secondary | ICD-10-CM

## 2022-03-16 LAB — MICROSCOPIC EXAMINATION
RBC, Urine: >30 /hpf — AB (ref 0–2)
WBC, UA: >30 /hpf — AB (ref 0–5)
WBC, UA: >30 /hpf — AB (ref 0–5)

## 2022-03-16 LAB — URINALYSIS, COMPLETE
Bilirubin, UA: NEGATIVE
Bilirubin, UA: NEGATIVE
Glucose, UA: NEGATIVE
Glucose, UA: NEGATIVE
Ketones, UA: NEGATIVE
Ketones, UA: NEGATIVE
Nitrite, UA: POSITIVE — AB
Nitrite, UA: POSITIVE — AB
Specific Gravity, UA: 1.02 (ref 1.005–1.030)
Specific Gravity, UA: 1.015 (ref 1.005–1.030)
Urobilinogen, Ur: 0.2 mg/dL (ref 0.2–1.0)
Urobilinogen, Ur: 0.2 mg/dL (ref 0.2–1.0)
pH, UA: 5.5 (ref 5.0–7.5)
pH, UA: 5.5 (ref 5.0–7.5)

## 2022-03-19 ENCOUNTER — Telehealth: Payer: Self-pay | Admitting: Urology

## 2022-03-19 LAB — CULTURE, URINE COMPREHENSIVE

## 2022-03-19 NOTE — Telephone Encounter (Signed)
Urine culture did grow bacteria.  If she is not having symptoms treatment is not recommended.  If she is having UTI symptoms send in Rx Septra DS 1 twice daily x7 days

## 2022-03-19 NOTE — Telephone Encounter (Signed)
Notified patient as instructed, patient states she is not having any symptom

## 2022-04-07 ENCOUNTER — Encounter: Payer: Self-pay | Admitting: Physician Assistant

## 2022-04-07 ENCOUNTER — Ambulatory Visit (INDEPENDENT_AMBULATORY_CARE_PROVIDER_SITE_OTHER): Payer: Medicare Other | Admitting: Physician Assistant

## 2022-04-07 VITALS — BP 127/76 | HR 67 | Temp 98.4°F | Ht 67.01 in | Wt 120.5 lb

## 2022-04-07 DIAGNOSIS — Z23 Encounter for immunization: Secondary | ICD-10-CM | POA: Diagnosis not present

## 2022-04-07 DIAGNOSIS — I10 Essential (primary) hypertension: Secondary | ICD-10-CM | POA: Diagnosis not present

## 2022-04-07 DIAGNOSIS — S81821A Laceration with foreign body, right lower leg, initial encounter: Secondary | ICD-10-CM

## 2022-04-07 MED ORDER — AMLODIPINE BESYLATE 5 MG PO TABS: 5.0000 mg | ORAL_TABLET | Freq: Every day | ORAL | 1 refills | Status: DC

## 2022-04-07 NOTE — Progress Notes (Signed)
Established Patient Office Visit  Name: Amanda Bruce   MRN: [id number]    DOB: May 01, 1941   Date:04/07/2022  Today's Provider: Talitha Givens, MHS, PA-C Introduced myself to the patient as a PA-C and provided education on APPs in clinical practice.         Subjective  Chief Complaint  Chief Complaint  Patient presents with   Hypertension    HPI   Hypertension: - Medications: Amlodipine 5 mg  - Compliance: excellent  - Checking BP at home: checking somewhat regularly - Denies any SOB, CP, vision changes, LE edema, medication SEs, or symptoms of hypotension - Diet: Overall normal dietary intake  - Exercise: active outside, dancing 2 nights per week  Patient works in her yard and has small dogs Either could be responsible for her scratch on her leg   Patient Active Problem List   Diagnosis Date Noted   Primary hypertension 02/24/2022   Chronic diastolic CHF (congestive heart failure) ([place]) 86/75/4492   Acute diastolic CHF (congestive heart failure) ([place]) 07/10/2020   Protein-calorie malnutrition, severe 07/10/2020   Right leg weakness 07/08/2020   Ureteral obstruction, left 07/08/2020   Cardiomegaly 07/08/2020   Cough 07/03/2020   Memory deficit 06/14/2019   Leg cramps, sleep related 06/12/2018   Advanced care planning/counseling discussion 06/14/2017   Hypercholesteremia 06/08/2016   Osteoarthritis of both knees 06/05/2015   Osteoporosis     Past Surgical History:  Procedure Laterality Date   CYSTOSCOPY WITH STENT PLACEMENT Left 07/08/2020   Procedure: CYSTOSCOPY WITH STENT PLACEMENT;  Surgeon: Abbie Sons, MD;  Location: [facility];  Service: Urology;  Laterality: Left;   CYSTOSCOPY/RETROGRADE/URETEROSCOPY Left 08/12/2020   Procedure: CYSTOSCOPY WITH URETEROSCOPY, with STENT removal;  Surgeon: Abbie Sons, MD;  Location: [facility];  Service: Urology;  Laterality: Left;    Family History  Problem Relation Age of Onset   Hypertension Mother     Cancer Father    Hypertension Sister    Cancer Brother    Breast cancer Other     Social History   Tobacco Use   Smoking status: Never   Smokeless tobacco: Never  Substance Use Topics   Alcohol use: No     Current Outpatient Medications:    acetaminophen (TYLENOL) 325 MG tablet, Take 325-650 mg by mouth every 6 (six) hours as needed (headaches.)., Disp: , Rfl:    aspirin EC 81 MG tablet, Take 81 mg by mouth daily., Disp: , Rfl:    Biotin 5000 MCG TABS, Take 5,000 mcg by mouth daily., Disp: , Rfl:    cholecalciferol (VITAMIN D3) 25 MCG (1000 UNIT) tablet, Take 1,000 Units by mouth daily., Disp: , Rfl:    COLLAGEN PO, Take 1,000 mg by mouth daily., Disp: , Rfl:    nystatin cream (MYCOSTATIN), SMARTSIG:1 sparingly Topical Twice Daily, Disp: , Rfl:    tretinoin (RETIN-A) 0.05 % cream, Apply topically at bedtime. (Patient taking differently: Apply 1 application  topically 3 (three) times a week.), Disp: 45 g, Rfl: 0   Vitamin A 2400 MCG (8000 UT) CAPS, Take 2,400 mcg by mouth daily., Disp: , Rfl:    vitamin C (ASCORBIC ACID) 500 MG tablet, Take 500 mg by mouth daily., Disp: , Rfl:    Vitamin E 670 MG (1000 UT) CAPS, Take 670 mg by mouth daily., Disp: , Rfl:    amLODipine (NORVASC) 5 MG tablet, Take 1 tablet (5 mg total) by mouth daily., Disp: 90 tablet, Rfl:  1  No Known Allergies  I personally reviewed active problem list, medication list, allergies, notes from last encounter, lab results with the patient/caregiver today.   Review of Systems  Eyes:  Negative for blurred vision and double vision.  Respiratory:  Negative for shortness of breath and wheezing.   Cardiovascular:  Negative for chest pain, palpitations and leg swelling.  Neurological:  Negative for dizziness and headaches.      Objective  Vitals:   04/07/22 0928  BP: 127/76  Pulse: 67  Temp: 98.4 F (36.9 C)  TempSrc: Oral  Weight: 120 lb 8 oz (54.7 kg)  Height: 5' 7.01" (1.702 m)    Body mass index is  18.87 kg/m.  Physical Exam Vitals reviewed.  Constitutional:      Appearance: Normal appearance.  HENT:     Head: Normocephalic and atraumatic.  Cardiovascular:     Rate and Rhythm: Normal rate and regular rhythm.     Pulses: Normal pulses.          Radial pulses are 2+ on the right side and 2+ on the left side.     Heart sounds: Normal heart sounds. No murmur heard.    No friction rub. No gallop.  Pulmonary:     Effort: Pulmonary effort is normal.     Breath sounds: No decreased air movement. No decreased breath sounds, wheezing, rhonchi or rales.  Musculoskeletal:     Right lower leg: No edema.     Left lower leg: No edema.  Neurological:     General: No focal deficit present.     Mental Status: She is alert. Mental status is at baseline.     GCS: GCS eye subscore is 4. GCS verbal subscore is 5. GCS motor subscore is 6.      Recent Results (from the past 2160 hour(s))  Lipid Profile     Status: Abnormal   Collection Time: 01/28/22  9:29 AM  Result Value Ref Range   Cholesterol, Total 208 (H) 100 - 199 mg/dL   Triglycerides 87 0 - 149 mg/dL   HDL 67 >39 mg/dL   VLDL Cholesterol Cal 15 5 - 40 mg/dL   LDL Chol Calc (NIH) 126 (H) 0 - 99 mg/dL   Chol/HDL Ratio 3.1 0.0 - 4.4 ratio    Comment:                                   T. Chol/HDL Ratio                                             Men  Women                               1/2 Avg.Risk  3.4    3.3                                   Avg.Risk  5.0    4.4                                2X Avg.Risk  9.6    7.1  3X Avg.Risk 23.4   11.0   Comp Met (CMET)     Status: Abnormal   Collection Time: 01/28/22  9:29 AM  Result Value Ref Range   Glucose 81 70 - 99 mg/dL   BUN 24 8 - 27 mg/dL   Creatinine, Ser 0.61 0.57 - 1.00 mg/dL   eGFR 90 >59 mL/min/1.73   BUN/Creatinine Ratio 39 (H) 12 - 28   Sodium 147 (H) 134 - 144 mmol/L   Potassium 3.9 3.5 - 5.2 mmol/L   Chloride 107 (H) 96 - 106 mmol/L    CO2 25 20 - 29 mmol/L   Calcium 9.2 8.7 - 10.3 mg/dL   Total Protein 7.0 6.0 - 8.5 g/dL   Albumin 4.4 3.8 - 4.8 g/dL   Globulin, Total 2.6 1.5 - 4.5 g/dL   Albumin/Globulin Ratio 1.7 1.2 - 2.2   Bilirubin Total 0.5 0.0 - 1.2 mg/dL   Alkaline Phosphatase 121 44 - 121 IU/L   AST 19 0 - 40 IU/L   ALT 12 0 - 32 IU/L  CBC w/Diff     Status: None   Collection Time: 01/28/22  9:29 AM  Result Value Ref Range   WBC 4.9 3.4 - 10.8 x10E3/uL   RBC 4.04 3.77 - 5.28 x10E6/uL   Hemoglobin 12.9 11.1 - 15.9 g/dL   Hematocrit 38.0 34.0 - 46.6 %   MCV 94 79 - 97 fL   MCH 31.9 26.6 - 33.0 pg   MCHC 33.9 31.5 - 35.7 g/dL   RDW 13.6 11.7 - 15.4 %   Platelets 286 150 - 450 x10E3/uL   Neutrophils 61 Not Estab. %   Lymphs 25 Not Estab. %   Monocytes 9 Not Estab. %   Eos 4 Not Estab. %   Basos 1 Not Estab. %   Neutrophils Absolute 3.0 1.4 - 7.0 x10E3/uL   Lymphocytes Absolute 1.2 0.7 - 3.1 x10E3/uL   Monocytes Absolute 0.5 0.1 - 0.9 x10E3/uL   EOS (ABSOLUTE) 0.2 0.0 - 0.4 x10E3/uL   Basophils Absolute 0.1 0.0 - 0.2 x10E3/uL   Immature Granulocytes 0 Not Estab. %   Immature Grans (Abs) 0.0 0.0 - 0.1 x10E3/uL  Urinalysis, Routine w reflex microscopic     Status: Abnormal   Collection Time: 02/08/22  1:49 PM  Result Value Ref Range   Specific Gravity, UA 1.025 1.005 - 1.030   pH, UA 5.5 5.0 - 7.5   Color, UA Red (A) Yellow    Comment: Unable to read or assay due to color interference.   Appearance Ur Turbid (A) Clear   Protein,UA CANCELED     Comment: Test not performed  Result canceled by the ancillary.    Glucose, UA CANCELED     Comment: Test not performed  Result canceled by the ancillary.    Ketones, UA CANCELED     Comment: Test not performed  Result canceled by the ancillary.    Microscopic Examination See below:   Microscopic Examination     Status: Abnormal   Collection Time: 02/08/22  1:49 PM   Urine  Result Value Ref Range   WBC, UA >30 (H) 0 - 5 /hpf   RBC, Urine >30 (H)  0 - 2 /hpf   Epithelial Cells (non renal) None seen 0 - 10 /hpf   Bacteria, UA None seen None seen/Few  Urine Culture     Status: Abnormal   Collection Time: 02/08/22  4:35 PM   Specimen: Urine   UR  Result  Value Ref Range   Urine Culture, Routine Final report (A)    Organism ID, Bacteria Escherichia coli (A)     Comment: Greater than 100,000 colony forming units per mL   Antimicrobial Susceptibility Comment     Comment:       ** S = Susceptible; I = Intermediate; R = Resistant **                    P = Positive; N = Negative             MICS are expressed in micrograms per mL    Antibiotic                 RSLT#1    RSLT#2    RSLT#3    RSLT#4 Amoxicillin/Clavulanic Acid    S Ampicillin                     R Cefepime                       S Ceftriaxone                    S Cefuroxime                     I Ciprofloxacin                  R Ertapenem                      S Gentamicin                     S Imipenem                       S Levofloxacin                   R Meropenem                      S Nitrofurantoin                 S Piperacillin/Tazobactam        S Tetracycline                   S Tobramycin                     S Trimethoprim/Sulfa             S   Urinalysis, Complete     Status: Abnormal   Collection Time: 03/15/22  1:38 PM  Result Value Ref Range   Specific Gravity, UA 1.020 1.005 - 1.030   pH, UA 5.5 5.0 - 7.5   Color, UA Yellow Yellow   Appearance Ur Hazy (A) Clear   Leukocytes,UA 2+ (A) Negative   Protein,UA 2+ (A) Negative/Trace   Glucose, UA Negative Negative   Ketones, UA Negative Negative   RBC, UA 2+ (A) Negative   Bilirubin, UA Negative Negative   Urobilinogen, Ur 0.2 0.2 - 1.0 mg/dL   Nitrite, UA Positive (A) Negative   Microscopic Examination See below:   Microscopic Examination     Status: Abnormal   Collection Time: 03/15/22  1:38 PM   Urine  Result Value Ref Range   WBC, UA >30 (A) 0 - 5 /hpf   RBC, Urine 11-30 (A)  0 - 2 /hpf    Epithelial Cells (non renal) 0-10 0 - 10 /hpf   Bacteria, UA Many (A) None seen/Few  CULTURE, URINE COMPREHENSIVE     Status: Abnormal   Collection Time: 03/16/22 10:18 AM   Specimen: Urine   UR  Result Value Ref Range   Urine Culture, Comprehensive Final report (A)    Organism ID, Bacteria Escherichia coli (A)     Comment: Greater than 100,000 colony forming units per mL Cefazolin <=4 ug/mL Cefazolin with an MIC <=16 predicts susceptibility to the oral agents cefaclor, cefdinir, cefpodoxime, cefprozil, cefuroxime, cephalexin, and loracarbef when used for therapy of uncomplicated urinary tract infections due to E. coli, Klebsiella pneumoniae, and Proteus mirabilis.    ANTIMICROBIAL SUSCEPTIBILITY Comment     Comment:       ** S = Susceptible; I = Intermediate; R = Resistant **                    P = Positive; N = Negative             MICS are expressed in micrograms per mL    Antibiotic                 RSLT#1    RSLT#2    RSLT#3    RSLT#4 Amoxicillin/Clavulanic Acid    S Ampicillin                     R Cefepime                       S Ceftriaxone                    S Cefuroxime                     I Ciprofloxacin                  R Ertapenem                      S Gentamicin                     S Imipenem                       S Levofloxacin                   R Meropenem                      S Nitrofurantoin                 S Piperacillin/Tazobactam        S Tetracycline                   S Tobramycin                     S Trimethoprim/Sulfa             S   Urinalysis, Complete     Status: Abnormal   Collection Time: 03/16/22 10:18 AM  Result Value Ref Range   Specific Gravity, UA 1.015 1.005 - 1.030   pH, UA 5.5 5.0 - 7.5   Color, UA Yellow Yellow   Appearance Ur Hazy (A) Clear   Leukocytes,UA 2+ (A) Negative   Protein,UA 2+ (A)  Negative/Trace   Glucose, UA Negative Negative   Ketones, UA Negative Negative   RBC, UA 2+ (A) Negative   Bilirubin, UA Negative  Negative   Urobilinogen, Ur 0.2 0.2 - 1.0 mg/dL   Nitrite, UA Positive (A) Negative   Microscopic Examination See below:   Microscopic Examination     Status: Abnormal   Collection Time: 03/16/22 10:18 AM   Urine  Result Value Ref Range   WBC, UA >30 (A) 0 - 5 /hpf   RBC, Urine >30 (A) 0 - 2 /hpf   Epithelial Cells (non renal) 0-10 0 - 10 /hpf   Bacteria, UA Many (A) None seen/Few     PHQ2/9:    02/08/2022    1:49 PM 01/28/2022    8:57 AM 12/28/2021   11:22 AM 07/03/2020    8:27 AM 06/14/2019    9:32 AM  Depression screen PHQ 2/9  Decreased Interest 0 0 3 0 0  Down, Depressed, Hopeless 0 0 0 0 0  PHQ - 2 Score 0 0 3 0 0  Altered sleeping 0 0 0  0  Tired, decreased energy 0 0 0  0  Change in appetite 0 0 0  0  Feeling bad or failure about yourself  0 0 0  0  Trouble concentrating 0 0 0  0  Moving slowly or fidgety/restless 0 0 0  0  Suicidal thoughts 0 0 0  0  PHQ-9 Score 0 0 3  0  Difficult doing work/chores Not difficult at all Not difficult at all Not difficult at all  Not difficult at all      Fall Risk:    02/08/2022    1:49 PM 01/28/2022    8:57 AM 12/28/2021   11:21 AM 07/03/2020    8:27 AM 06/14/2019    9:32 AM  Fall Risk   Falls in the past year? 0 0 0 0 0  Number falls in past yr: 0 0 0  0  Injury with Fall? 0 0 0  0  Risk for fall due to : No Fall Risks No Fall Risks No Fall Risks    Follow up Falls evaluation completed Falls evaluation completed Falls evaluation completed        Functional Status Survey:      Assessment & Plan  Problem List Items Addressed This Visit       Cardiovascular and Mediastinum   Primary hypertension    Chronic, newly diagnosed  She is taking Amlodipine 5 mg PO QD and appears to be tolerating well Returned today with 5 BP readings from home that appeared to demonstrate downward trend in BP  Reviewed importance of continuing Amlodipine daily and provided refills Reviewed importance of getting a BP cuff to use at home and  checking BP several times per week along with upper thresholds that would indicate need for sooner follow up- patient verbalized understanding and agreement- instructions provided in her AVS as well Continue Amlodipine 5 mg PO QD  Follow up in 6 months for monitoring or sooner if concerns arise.       Relevant Medications   amLODipine (NORVASC) 5 MG tablet   Other Visit Diagnoses     Need for influenza vaccination    -  Primary   Relevant Orders   Flu Vaccine QUAD High Dose(Fluad) (Completed)   Laceration with foreign body, right lower leg, initial encounter     Lower right leg has what appears to be scabbing 5 cm laceration  Appears well healing Will provide Tdap for coverage and reviewed home care measures to prevent infection.    Relevant Orders   Tdap vaccine greater than or equal to 7yo IM (Completed)        Return in about 6 months (around 10/07/2022) for HTN.   I, Robertine Kipper E Marta Bouie, PA-C, have reviewed all documentation for this visit. The documentation on 04/07/22 for the exam, diagnosis, procedures, and orders are all accurate and complete.   Talitha Givens, MHS, PA-C [facility]

## 2022-04-07 NOTE — Assessment & Plan Note (Signed)
Chronic, newly diagnosed  She is taking Amlodipine 5 mg PO QD and appears to be tolerating well Returned today with 5 BP readings from home that appeared to demonstrate downward trend in BP  Reviewed importance of continuing Amlodipine daily and provided refills Reviewed importance of getting a BP cuff to use at home and checking BP several times per week along with upper thresholds that would indicate need for sooner follow up- patient verbalized understanding and agreement- instructions provided in her AVS as well Continue Amlodipine 5 mg PO QD  Follow up in 6 months for monitoring or sooner if concerns arise.

## 2022-04-07 NOTE — Patient Instructions (Addendum)
Please continue to measure your blood pressure a few times per week- try to get a blood pressure cuff at the store so you can measure your blood pressure at home   If you notice your readings are getting higher than 135 on the top and higher than 90 on the bottom for several days in a row- please come back in and see [place] so we can discuss that.   Continue to take your Amlodipine 5 mg by mouth once per day   Please go to [place] for your COVID booster

## 2022-04-21 ENCOUNTER — Other Ambulatory Visit: Payer: Self-pay

## 2022-04-21 DIAGNOSIS — I10 Essential (primary) hypertension: Secondary | ICD-10-CM

## 2022-04-21 NOTE — Telephone Encounter (Unsigned)
Copied from [place] [id number]. Topic: General - Other >> Apr 21, 2022 10:16 AM Everette C wrote: Reason for CRM: Medication Refill - Medication: amLODipine (NORVASC) 5 MG tablet [[id number]]   Has the patient contacted their pharmacy? No. The patient was uncertain of the medication's status  (Agent: If no, request that the patient contact the pharmacy for the refill. If patient does not wish to contact the pharmacy document the reason why and proceed with request.) (Agent: If yes, when and what did the pharmacy advise?)  Preferred Pharmacy (with phone number or street name): [facility], [place] [address] Phone: [phone] Fax: [phone] Hours: Not open 24 hours   Has the patient been seen for an appointment in the last year OR does the patient have an upcoming appointment? Yes.    Agent: Please be advised that RX refills may take up to 3 business days. We ask that you follow-up with your pharmacy.

## 2022-04-22 NOTE — Telephone Encounter (Signed)
Called pharmacy to verify/ pt has refills for amlodipine already at the pharmacy / pt was not informed correctly when she called the pharmacy / no refill needed at this time

## 2022-05-28 DIAGNOSIS — N201 Calculus of ureter: Secondary | ICD-10-CM

## 2022-05-28 HISTORY — DX: Calculus of ureter: N20.1

## 2022-06-16 ENCOUNTER — Encounter
Admission: EM | Disposition: A | Discharge status: Home / Self Care | Payer: Self-pay | Source: Home / Self Care | Attending: Internal Medicine

## 2022-06-16 ENCOUNTER — Other Ambulatory Visit: Payer: Self-pay

## 2022-06-16 ENCOUNTER — Inpatient Hospital Stay: Payer: Medicare Other | Admitting: Anesthesiology

## 2022-06-16 ENCOUNTER — Inpatient Hospital Stay
Admission: EM | Admit: 2022-06-16 | Discharge: 2022-06-18 | DRG: 659 | Disposition: A | Discharge status: Home / Self Care | Payer: Medicare Other | Attending: Internal Medicine | Admitting: Internal Medicine

## 2022-06-16 ENCOUNTER — Encounter: Payer: Self-pay | Admitting: Internal Medicine

## 2022-06-16 ENCOUNTER — Emergency Department: Payer: Medicare Other

## 2022-06-16 ENCOUNTER — Inpatient Hospital Stay: Payer: Medicare Other

## 2022-06-16 DIAGNOSIS — N132 Hydronephrosis with renal and ureteral calculous obstruction: Secondary | ICD-10-CM | POA: Diagnosis present

## 2022-06-16 DIAGNOSIS — Z681 Body mass index (BMI) 19 or less, adult: Secondary | ICD-10-CM

## 2022-06-16 DIAGNOSIS — E43 Unspecified severe protein-calorie malnutrition: Secondary | ICD-10-CM | POA: Diagnosis not present

## 2022-06-16 DIAGNOSIS — R8281 Pyuria: Secondary | ICD-10-CM | POA: Diagnosis not present

## 2022-06-16 DIAGNOSIS — Z79899 Other long term (current) drug therapy: Secondary | ICD-10-CM

## 2022-06-16 DIAGNOSIS — I10 Essential (primary) hypertension: Secondary | ICD-10-CM | POA: Diagnosis present

## 2022-06-16 DIAGNOSIS — R110 Nausea: Secondary | ICD-10-CM | POA: Diagnosis not present

## 2022-06-16 DIAGNOSIS — N281 Cyst of kidney, acquired: Secondary | ICD-10-CM | POA: Diagnosis present

## 2022-06-16 DIAGNOSIS — N201 Calculus of ureter: Secondary | ICD-10-CM | POA: Diagnosis not present

## 2022-06-16 DIAGNOSIS — I959 Hypotension, unspecified: Secondary | ICD-10-CM | POA: Diagnosis not present

## 2022-06-16 DIAGNOSIS — N12 Tubulo-interstitial nephritis, not specified as acute or chronic: Secondary | ICD-10-CM | POA: Diagnosis present

## 2022-06-16 DIAGNOSIS — A419 Sepsis, unspecified organism: Secondary | ICD-10-CM | POA: Diagnosis not present

## 2022-06-16 DIAGNOSIS — I700 Atherosclerosis of aorta: Secondary | ICD-10-CM | POA: Diagnosis present

## 2022-06-16 DIAGNOSIS — Z1611 Resistance to penicillins: Secondary | ICD-10-CM | POA: Diagnosis present

## 2022-06-16 DIAGNOSIS — B9620 Unspecified Escherichia coli [E. coli] as the cause of diseases classified elsewhere: Secondary | ICD-10-CM | POA: Diagnosis present

## 2022-06-16 DIAGNOSIS — D72829 Elevated white blood cell count, unspecified: Secondary | ICD-10-CM | POA: Diagnosis not present

## 2022-06-16 DIAGNOSIS — I5032 Chronic diastolic (congestive) heart failure: Secondary | ICD-10-CM | POA: Diagnosis present

## 2022-06-16 DIAGNOSIS — I110 Hypertensive heart disease with heart failure: Secondary | ICD-10-CM | POA: Diagnosis present

## 2022-06-16 DIAGNOSIS — Z8249 Family history of ischemic heart disease and other diseases of the circulatory system: Secondary | ICD-10-CM | POA: Diagnosis not present

## 2022-06-16 DIAGNOSIS — Z7982 Long term (current) use of aspirin: Secondary | ICD-10-CM | POA: Diagnosis not present

## 2022-06-16 DIAGNOSIS — K449 Diaphragmatic hernia without obstruction or gangrene: Secondary | ICD-10-CM | POA: Diagnosis not present

## 2022-06-16 DIAGNOSIS — R310 Gross hematuria: Secondary | ICD-10-CM | POA: Diagnosis not present

## 2022-06-16 DIAGNOSIS — N202 Calculus of kidney with calculus of ureter: Secondary | ICD-10-CM | POA: Diagnosis present

## 2022-06-16 DIAGNOSIS — R000 Tachycardia, unspecified: Secondary | ICD-10-CM | POA: Diagnosis not present

## 2022-06-16 DIAGNOSIS — Z1623 Resistance to quinolones and fluoroquinolones: Secondary | ICD-10-CM | POA: Diagnosis present

## 2022-06-16 DIAGNOSIS — M549 Dorsalgia, unspecified: Secondary | ICD-10-CM | POA: Diagnosis not present

## 2022-06-16 DIAGNOSIS — N134 Hydroureter: Secondary | ICD-10-CM | POA: Diagnosis not present

## 2022-06-16 DIAGNOSIS — Z1152 Encounter for screening for COVID-19: Secondary | ICD-10-CM

## 2022-06-16 DIAGNOSIS — N136 Pyonephrosis: Principal | ICD-10-CM | POA: Diagnosis present

## 2022-06-16 DIAGNOSIS — R9431 Abnormal electrocardiogram [ECG] [EKG]: Secondary | ICD-10-CM | POA: Diagnosis not present

## 2022-06-16 DIAGNOSIS — E876 Hypokalemia: Secondary | ICD-10-CM | POA: Diagnosis not present

## 2022-06-16 DIAGNOSIS — N200 Calculus of kidney: Secondary | ICD-10-CM | POA: Diagnosis not present

## 2022-06-16 DIAGNOSIS — N1330 Unspecified hydronephrosis: Secondary | ICD-10-CM | POA: Diagnosis not present

## 2022-06-16 DIAGNOSIS — I5031 Acute diastolic (congestive) heart failure: Secondary | ICD-10-CM | POA: Diagnosis not present

## 2022-06-16 HISTORY — PX: CYSTOSCOPY/URETEROSCOPY/HOLMIUM LASER/STENT PLACEMENT: SHX6546

## 2022-06-16 LAB — URINALYSIS, ROUTINE W REFLEX MICROSCOPIC
Bacteria, UA: NONE SEEN
RBC / HPF: >50 RBC/hpf — ABNORMAL HIGH (ref 0–5)
Specific Gravity, Urine: 1.015 (ref 1.005–1.030)
Squamous Epithelial / HPF: NONE SEEN (ref 0–5)
WBC, UA: >50 WBC/hpf — ABNORMAL HIGH (ref 0–5)

## 2022-06-16 LAB — COMPREHENSIVE METABOLIC PANEL
ALT: 13 U/L (ref 0–44)
AST: 27 U/L (ref 15–41)
Albumin: 4 g/dL (ref 3.5–5.0)
Alkaline Phosphatase: 110 U/L (ref 38–126)
Anion gap: 8 (ref 5–15)
BUN: 30 mg/dL — ABNORMAL HIGH (ref 8–23)
CO2: 23 mmol/L (ref 22–32)
Calcium: 8.9 mg/dL (ref 8.9–10.3)
Chloride: 109 mmol/L (ref 98–111)
Creatinine, Ser: 0.51 mg/dL (ref 0.44–1.00)
GFR, Estimated: >60 mL/min (ref >60)
Glucose, Bld: 150 mg/dL — ABNORMAL HIGH (ref 70–99)
Potassium: 3.3 mmol/L — ABNORMAL LOW (ref 3.5–5.1)
Sodium: 140 mmol/L (ref 135–145)
Total Bilirubin: 1 mg/dL (ref 0.3–1.2)
Total Protein: 7.5 g/dL (ref 6.5–8.1)

## 2022-06-16 LAB — CBC WITH DIFFERENTIAL/PLATELET
Abs Immature Granulocytes: 0.07 10*3/uL (ref 0.00–0.07)
Basophils Absolute: 0.1 10*3/uL (ref 0.0–0.1)
Basophils Relative: 0 %
Eosinophils Absolute: 0 10*3/uL (ref 0.0–0.5)
Eosinophils Relative: 0 %
HCT: 36.6 % (ref 36.0–46.0)
Hemoglobin: 12.3 g/dL (ref 12.0–15.0)
Immature Granulocytes: 1 %
Lymphocytes Relative: 3 %
Lymphs Abs: 0.5 10*3/uL — ABNORMAL LOW (ref 0.7–4.0)
MCH: 32 pg (ref 26.0–34.0)
MCHC: 33.6 g/dL (ref 30.0–36.0)
MCV: 95.3 fL (ref 80.0–100.0)
Monocytes Absolute: 1.3 10*3/uL — ABNORMAL HIGH (ref 0.1–1.0)
Monocytes Relative: 9 %
Neutro Abs: 13.4 10*3/uL — ABNORMAL HIGH (ref 1.7–7.7)
Neutrophils Relative %: 87 %
Platelets: 286 10*3/uL (ref 150–400)
RBC: 3.84 MIL/uL — ABNORMAL LOW (ref 3.87–5.11)
RDW: 12.2 % (ref 11.5–15.5)
WBC: 15.3 10*3/uL — ABNORMAL HIGH (ref 4.0–10.5)
nRBC: 0 % (ref 0.0–0.2)

## 2022-06-16 LAB — RESP PANEL BY RT-PCR (RSV, FLU A&B, COVID)  RVPGX2
Influenza A by PCR: NEGATIVE
Influenza B by PCR: NEGATIVE
Resp Syncytial Virus by PCR: NEGATIVE
SARS Coronavirus 2 by RT PCR: NEGATIVE

## 2022-06-16 LAB — MAGNESIUM: Magnesium: 2.2 mg/dL (ref 1.7–2.4)

## 2022-06-16 LAB — LIPASE, BLOOD: Lipase: 33 U/L (ref 11–51)

## 2022-06-16 SURGERY — CYSTOSCOPY/URETEROSCOPY/HOLMIUM LASER/STENT PLACEMENT
Anesthesia: General | Laterality: Left

## 2022-06-16 MED ORDER — LACTATED RINGERS IV SOLN: INTRAVENOUS | Status: DC

## 2022-06-16 MED ORDER — LIDOCAINE HCL (CARDIAC) PF 100 MG/5ML IV SOSY
PREFILLED_SYRINGE | INTRAVENOUS | Status: DC | PRN
  Administered 2022-06-16: 40 mg via INTRAVENOUS

## 2022-06-16 MED ORDER — ONDANSETRON HCL 4 MG/2ML IJ SOLN
INTRAMUSCULAR | Status: AC
  Filled 2022-06-16: qty 2

## 2022-06-16 MED ORDER — FENTANYL CITRATE (PF) 100 MCG/2ML IJ SOLN
INTRAMUSCULAR | Status: AC
  Administered 2022-06-16: 25 ug via INTRAVENOUS
  Filled 2022-06-16: qty 2

## 2022-06-16 MED ORDER — FENTANYL CITRATE (PF) 100 MCG/2ML IJ SOLN
INTRAMUSCULAR | Status: AC
  Filled 2022-06-16: qty 2

## 2022-06-16 MED ORDER — PHENYLEPHRINE 80 MCG/ML (10ML) SYRINGE FOR IV PUSH (FOR BLOOD PRESSURE SUPPORT)
PREFILLED_SYRINGE | INTRAVENOUS | Status: DC | PRN
  Administered 2022-06-16 (×2): 40 ug via INTRAVENOUS
  Administered 2022-06-16: 80 ug via INTRAVENOUS

## 2022-06-16 MED ORDER — IOHEXOL 300 MG/ML  SOLN
100.0000 mL | Freq: Once | INTRAMUSCULAR | Status: AC | PRN
  Administered 2022-06-16: 100 mL via INTRAVENOUS

## 2022-06-16 MED ORDER — ONDANSETRON HCL 4 MG PO TABS: 4.0000 mg | ORAL_TABLET | Freq: Four times a day (QID) | ORAL | Status: DC | PRN

## 2022-06-16 MED ORDER — ACETAMINOPHEN 500 MG PO TABS: 500.0000 mg | ORAL_TABLET | Freq: Four times a day (QID) | ORAL | Status: DC | PRN

## 2022-06-16 MED ORDER — IOHEXOL 180 MG/ML  SOLN
INTRAMUSCULAR | Status: DC | PRN
  Administered 2022-06-16: 10 mL via INTRATHECAL

## 2022-06-16 MED ORDER — FENTANYL CITRATE (PF) 100 MCG/2ML IJ SOLN
INTRAMUSCULAR | Status: DC | PRN
  Administered 2022-06-16 (×4): 25 ug via INTRAVENOUS

## 2022-06-16 MED ORDER — DEXAMETHASONE SODIUM PHOSPHATE 10 MG/ML IJ SOLN
INTRAMUSCULAR | Status: DC | PRN
  Administered 2022-06-16: 4 mg via INTRAVENOUS

## 2022-06-16 MED ORDER — VITAMIN C 500 MG PO TABS
500.0000 mg | ORAL_TABLET | Freq: Every day | ORAL | Status: DC
  Administered 2022-06-17 – 2022-06-18 (×2): 500 mg via ORAL
  Filled 2022-06-16 (×2): qty 1

## 2022-06-16 MED ORDER — ONDANSETRON HCL 4 MG/2ML IJ SOLN: 4.0000 mg | Freq: Once | INTRAMUSCULAR | Status: DC | PRN

## 2022-06-16 MED ORDER — SODIUM CHLORIDE 0.9 % IR SOLN
Status: DC | PRN
  Administered 2022-06-16: 250 mL

## 2022-06-16 MED ORDER — PROPOFOL 10 MG/ML IV BOLUS
INTRAVENOUS | Status: AC
  Filled 2022-06-16: qty 40

## 2022-06-16 MED ORDER — ACETAMINOPHEN 500 MG PO TABS
ORAL_TABLET | ORAL | Status: AC
  Administered 2022-06-16: 500 mg via ORAL
  Filled 2022-06-16: qty 1

## 2022-06-16 MED ORDER — ONDANSETRON HCL 4 MG/2ML IJ SOLN: 4.0000 mg | Freq: Four times a day (QID) | INTRAMUSCULAR | Status: DC | PRN

## 2022-06-16 MED ORDER — LIDOCAINE HCL (PF) 2 % IJ SOLN
INTRAMUSCULAR | Status: AC
  Filled 2022-06-16: qty 5

## 2022-06-16 MED ORDER — MORPHINE SULFATE (PF) 4 MG/ML IV SOLN
4.0000 mg | Freq: Once | INTRAVENOUS | Status: AC
  Administered 2022-06-16: 4 mg via INTRAVENOUS
  Filled 2022-06-16: qty 1

## 2022-06-16 MED ORDER — ONDANSETRON HCL 4 MG/2ML IJ SOLN
4.0000 mg | Freq: Once | INTRAMUSCULAR | Status: AC
  Administered 2022-06-16: 4 mg via INTRAVENOUS
  Filled 2022-06-16: qty 2

## 2022-06-16 MED ORDER — PROPOFOL 10 MG/ML IV BOLUS
INTRAVENOUS | Status: DC | PRN
  Administered 2022-06-16: 40 mg via INTRAVENOUS
  Administered 2022-06-16: 50 mg via INTRAVENOUS
  Administered 2022-06-16: 20 mg via INTRAVENOUS

## 2022-06-16 MED ORDER — FENTANYL CITRATE (PF) 100 MCG/2ML IJ SOLN
25.0000 ug | INTRAMUSCULAR | Status: DC | PRN
  Administered 2022-06-16 (×3): 25 ug via INTRAVENOUS

## 2022-06-16 MED ORDER — SODIUM CHLORIDE 0.9 % IV SOLN
1.0000 g | INTRAVENOUS | Status: DC
  Administered 2022-06-17 – 2022-06-18 (×2): 1 g via INTRAVENOUS
  Filled 2022-06-16 (×2): qty 10

## 2022-06-16 MED ORDER — SODIUM CHLORIDE 0.9 % IV SOLN
1.0000 g | Freq: Once | INTRAVENOUS | Status: AC
  Administered 2022-06-16: 1 g via INTRAVENOUS
  Filled 2022-06-16: qty 10

## 2022-06-16 MED ORDER — POTASSIUM CHLORIDE CRYS ER 20 MEQ PO TBCR
40.0000 meq | EXTENDED_RELEASE_TABLET | Freq: Once | ORAL | Status: AC
  Administered 2022-06-16: 40 meq via ORAL
  Filled 2022-06-16: qty 2

## 2022-06-16 MED ORDER — LACTATED RINGERS IV SOLN: INTRAVENOUS | Status: DC | PRN

## 2022-06-16 MED ORDER — MORPHINE SULFATE (PF) 2 MG/ML IV SOLN
2.0000 mg | INTRAVENOUS | Status: DC | PRN
  Filled 2022-06-16: qty 1

## 2022-06-16 MED ORDER — ACETAMINOPHEN 325 MG PO TABS
325.0000 mg | ORAL_TABLET | Freq: Four times a day (QID) | ORAL | Status: DC | PRN
  Administered 2022-06-16: 650 mg via ORAL
  Filled 2022-06-16: qty 2

## 2022-06-16 MED ORDER — SODIUM CHLORIDE 0.9 % IV BOLUS
1000.0000 mL | Freq: Once | INTRAVENOUS | Status: AC
  Administered 2022-06-16: 1000 mL via INTRAVENOUS

## 2022-06-16 SURGICAL SUPPLY — 30 items
BAG DRAIN SIEMENS DORNER NS (MISCELLANEOUS) ×1 IMPLANT
BASKET ZERO TIP 1.9FR (BASKET) IMPLANT
BRUSH SCRUB EZ 1% IODOPHOR (MISCELLANEOUS) ×2 IMPLANT
CATH FOL 2WAY LX 16X30 (CATHETERS) IMPLANT
CATH URET FLEX-TIP 2 LUMEN 10F (CATHETERS) IMPLANT
CATH URETL OPEN 5X70 (CATHETERS) IMPLANT
CATH URETL OPEN END 6X70 (CATHETERS) IMPLANT
CNTNR SPEC 2.5X3XGRAD LEK (MISCELLANEOUS) ×1
CONT SPEC 4OZ STRL OR WHT (MISCELLANEOUS) ×1
CONTAINER SPEC 2.5X3XGRAD LEK (MISCELLANEOUS) IMPLANT
DRAPE UTILITY 15X26 TOWEL STRL (DRAPES) ×1 IMPLANT
FIBER LASER MOSES 200 DFL (Laser) IMPLANT
GLOVE SURG UNDER POLY LF SZ7.5 (GLOVE) ×2 IMPLANT
GOWN STRL REUS W/ TWL LRG LVL3 (GOWN DISPOSABLE) ×2 IMPLANT
GOWN STRL REUS W/ TWL XL LVL3 (GOWN DISPOSABLE) ×2 IMPLANT
GOWN STRL REUS W/TWL LRG LVL3 (GOWN DISPOSABLE) ×2
GOWN STRL REUS W/TWL XL LVL3 (GOWN DISPOSABLE) ×2
GUIDEWIRE STR DUAL SENSOR (WIRE) ×2 IMPLANT
IV NS IRRIG 3000ML ARTHROMATIC (IV SOLUTION) ×2 IMPLANT
KIT TURNOVER CYSTO (KITS) ×2 IMPLANT
PACK CYSTO AR (MISCELLANEOUS) ×2 IMPLANT
SET CYSTO W/LG BORE CLAMP LF (SET/KITS/TRAYS/PACK) ×2 IMPLANT
SHEATH NAVIGATOR HD 12/14X36 (SHEATH) IMPLANT
STENT URET 6FRX24 CONTOUR (STENTS) IMPLANT
STENT URET 6FRX26 CONTOUR (STENTS) IMPLANT
SURGILUBE 2OZ TUBE FLIPTOP (MISCELLANEOUS) ×2 IMPLANT
SYR 10ML LL (SYRINGE) ×1 IMPLANT
TRAP FLUID SMOKE EVACUATOR (MISCELLANEOUS) ×2 IMPLANT
VALVE UROSEAL ADJ ENDO (VALVE) IMPLANT
WATER STERILE IRR 500ML POUR (IV SOLUTION) ×2 IMPLANT

## 2022-06-16 NOTE — ED Notes (Signed)
Pt's son called for an update. Told him that his mom was going to be admitted to the hospital and that the hospitalist has been in to see the pt. I also informed him that we are waiting on her urologist to consult on next steps.

## 2022-06-16 NOTE — Transfer of Care (Signed)
Immediate Anesthesia Transfer of Care Note  Patient: Amanda Bruce  Procedure(s) Performed: CYSTOSCOPY/URETEROSCOPY/RETROGRADE PYELOGRAM/STENT PLACEMENT (Left)  Patient Location: PACU  Anesthesia Type:General  Level of Consciousness: awake, alert , and oriented  Airway & Oxygen Therapy: Patient Spontanous Breathing  Post-op Assessment: Report given to RN and Post -op Vital signs reviewed and stable  Post vital signs: Reviewed and stable  Last Vitals:  Vitals Value Taken Time  BP 131/66 06/16/22 1603  Temp 36.9 C 06/16/22 1603  Pulse 80 06/16/22 1603  Resp 18 06/16/22 1603  SpO2 97 % 06/16/22 1603    Last Pain:  Vitals:   06/16/22 1603  TempSrc: Oral  PainSc:       Patients Stated Pain Goal: 3 (05/69/79 4801)  Complications: No notable events documented.

## 2022-06-16 NOTE — ED Notes (Signed)
Pt. Up to bathroom indep. Pt. Gait steady, NAD.

## 2022-06-16 NOTE — ED Provider Notes (Signed)
Care assumed of patient from outgoing provider.  See their note for initial history, exam and plan.  Clinical Course as of 06/16/22 0824  Wed Jun 16, 2022  0710 History of kidney stones with b/l flank pain with hematuria.  Vitals reassuring. CT scan concern for hemorrhagic lesion? CT hematuria work up - Urology consulted and following. Given 1g IV ceftriaxone [SM]    Clinical Course User Index [SM] Nathaniel Man, MD   On chart review patient has had prior urine cultures that have been sensitive to ceftriaxone in the past.  CT scan hematuria protocol showed moderate left-sided hydronephrosis and hydroureter with perinephric edema with a 4 mm stone to the left UVJ -findings concerning for pyelonephritis.  2 nonenhancing hyperdensity filling defects to the left upper and lower collecting system concerning for possible blood clots -recommended follow-up to exclude underlying neoplasm.  Reengaged with urology given CT hematuria results.  Consulted hospitalist for admission.   Nathaniel Man, MD 06/16/22 [phone]

## 2022-06-16 NOTE — Discharge Instructions (Addendum)
Some PCP options in [place] area- not a comprehensive list  [facility]- [place]- [phone] [facility]- [place]- [place]- [phone]  or [facility] Physician Referral Line [phone]

## 2022-06-16 NOTE — H&P (Signed)
History and Physical    Patient: Amanda Bruce ERX:[id number] DOB: February 01, 1941 DOA: 06/16/2022 DOS: the patient was seen and examined on 06/16/2022 PCP: Pcp, No  Patient coming from: Home  Chief Complaint:  Chief Complaint  Patient presents with   Back Pain   HPI: Amanda Bruce is a 81 y.o. female with medical history significant for nephrolithiasis who presents to the ER for evaluation of low back pain mostly in the left lumbar area which she rates a 10 x 10 in intensity at its worst.  Pain is associated with nausea, vomiting, diarrhea, hematuria and chills.  She denies having any fever and is not on any anticoagulant therapy. She denies having any chest pain, no shortness of breath, no headache, no dizziness, no lightheadedness, no leg swelling, no cough, no blurred vision, no focal deficit. Significant labs include potassium of 3.3 Renal stone CT shows stable marked severity left-sided hydronephrosis and hydroureter, to the level of the distal left ureter, without visualization of an obstructing ureteral calculus. Findings which may represent acute blood within the left ureter and upper pole calyx of the left kidney. A hemorrhagic lesion within the upper pole of the left kidney cannot be excluded. Further evaluation with CT or MRI (without and with IV contrast) and urology consult is recommended. Numerous bilateral subcentimeter non-obstructing renal calculi. Large left renal cyst. This may represent a large simple cyst, however, given the additional findings stated above, an associated soft tissue component cannot be excluded. CT or MRI correlation is again recommended for improved characterization. Small hiatal hernia. Aortic atherosclerosis. CT hematuria workup shows moderate left hydronephrosis and hydroureter with diffuse perinephric edema. There is a 4 mm stone identified at the left UVJ. Striated nephrographic appearance of the left kidney is identified on the delayed images  which may reflect underlying pyelonephritis. There are two, nonenhancing hyperdense filling defects within the dilated left upper and lower pole collecting system. There is also increased density throughout the mid and distal left ureter without definite signs of enhancement. Imaging findings are favored to represent blood clot. Absence of contrast material within the left renal collecting system and ureter on the delayed images diminishes sensitivity for detecting underlying urothelial lesion. Follow-up imaging is recommended to ensure clearance of these hyperdense filling defects and to exclude underlying urothelial neoplasm. Bilateral nephrolithiasis. Small hiatal hernia. 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 Aortic Atherosclerosis  Urology has been consulted in the ER Patient received 1 L IV fluid bolus and a dose of Rocephin 1 g will be admitted to the hospital for further evaluation.    Review of Systems: As mentioned in the history of present illness. All other systems reviewed and are negative. Past Medical History:  Diagnosis Date   History of kidney stones    30+ years ago   Osteoporosis    Past Surgical History:  Procedure Laterality Date   CYSTOSCOPY WITH STENT PLACEMENT Left 07/08/2020   Procedure: CYSTOSCOPY WITH STENT PLACEMENT;  Surgeon: Abbie Sons, MD;  Location: [facility];  Service: Urology;  Laterality: Left;   CYSTOSCOPY/RETROGRADE/URETEROSCOPY Left 08/12/2020   Procedure: CYSTOSCOPY WITH URETEROSCOPY, with STENT removal;  Surgeon: Abbie Sons, MD;  Location: [facility];  Service: Urology;  Laterality: Left;   Social History:  reports that she has never smoked. She has never used smokeless tobacco. She reports that she does not drink alcohol and does not use drugs.  No Known Allergies  Family History  Problem Relation Age of Onset   Hypertension  Mother    Cancer Father    Hypertension Sister    Cancer Brother    Breast cancer Other      Prior to Admission medications   Medication Sig Start Date End Date Taking? Authorizing Provider  acetaminophen (TYLENOL) 325 MG tablet Take 325-650 mg by mouth every 6 (six) hours as needed (headaches.).   Yes [provider]  amLODipine (NORVASC) 5 MG tablet Take 1 tablet (5 mg total) by mouth daily. 04/07/22 10/04/22 Yes Mecum, Erin E, PA-C  aspirin EC 81 MG tablet Take 81 mg by mouth daily.   Yes [provider]  Biotin 5000 MCG TABS Take 5,000 mcg by mouth daily.   Yes [provider]  cholecalciferol (VITAMIN D3) 25 MCG (1000 UNIT) tablet Take 1,000 Units by mouth daily.   Yes [provider]  COLLAGEN PO Take 1,000 mg by mouth daily.   Yes [provider]  nystatin cream (MYCOSTATIN) SMARTSIG:1 sparingly Topical Twice Daily 10/13/21  Yes [provider]  tretinoin (RETIN-A) 0.05 % cream Apply topically at bedtime. Patient taking differently: Apply 1 application  topically 3 (three) times a week. 08/11/18  Yes Guadalupe Maple, MD  Vitamin A 2400 MCG (8000 UT) CAPS Take 2,400 mcg by mouth daily.   Yes [provider]  vitamin C (ASCORBIC ACID) 500 MG tablet Take 500 mg by mouth daily.   Yes [provider]  Vitamin E 670 MG (1000 UT) CAPS Take 670 mg by mouth daily.   Yes [provider]    Physical Exam: Vitals:   06/16/22 0610 06/16/22 0611 06/16/22 0649 06/16/22 0747  BP:   (!) 149/90 122/83  Pulse: 83 80 86 77  Resp: '17 17 16 15  '$ Temp:    98.6 F (37 C)  TempSrc:    Oral  SpO2: 94% 94% 98% 98%  Weight:      Height:       Physical Exam Vitals and nursing note reviewed.  Constitutional:      Comments: Acutely ill-appearing  HENT:     Head: Normocephalic and atraumatic.     Nose: Nose normal.     Mouth/Throat:     Mouth: Mucous membranes are dry.  Eyes:     Conjunctiva/sclera: Conjunctivae normal.  Cardiovascular:     Rate and Rhythm: Normal rate and regular rhythm.  Pulmonary:      Effort: Pulmonary effort is normal.     Breath sounds: Normal breath sounds.  Abdominal:     General: Abdomen is flat. Bowel sounds are normal.     Palpations: Abdomen is soft.  Genitourinary:    Comments: Lt CVA tenderness Musculoskeletal:        General: Normal range of motion.     Cervical back: Normal range of motion and neck supple.  Skin:    General: Skin is warm and dry.  Neurological:     General: No focal deficit present.     Mental Status: She is alert.     Motor: Weakness present.  Psychiatric:        Mood and Affect: Mood normal.        Behavior: Behavior normal.     Data Reviewed: Relevant notes from primary care and specialist visits, past discharge summaries as available in EHR, including Care Everywhere. Prior diagnostic testing as pertinent to current admission diagnoses Updated medications and problem lists for reconciliation ED course, including vitals, labs, imaging, treatment and response to treatment Triage notes, nursing and  pharmacy notes and ED provider's notes Notable results as noted in HPI Labs reviewed.  Sodium 140, potassium 3.3, chloride 109, bicarb 23, glucose 150, BUN 30, creatinine 0.51, calcium 8.9, total protein 7.5, albumin 4.0, AST 27, ALT 13, alkaline phosphatase 110, total bilirubin 1.0, white count 15.3, hemoglobin 12.3, hematocrit 36.6, platelet count 286 Twelve-lead EKG reviewed by me shows sinus rhythm with left axis deviation There are no new results to review at this time.  Assessment and Plan: * Pyelonephritis of left kidney Secondary to nephrolithiasis with obstruction resulting in moderate left hydronephrosis and hydroureter with diffuse perinephric edema. Patient has left CVA tenderness with marked leukocytosis Prior urine culture yielded E. Coli Continue empiric antibiotic therapy with Rocephin IV fluid hydration Urology consult Follow-up results of blood and urine culture   Hydronephrosis concurrent with and due to  calculi of kidney and ureter Consult urology for evaluation for possible stent placement to relieve obstruction Keep patient NPO until seen by urology Other treatment as outlined in 1  Hypokalemia Supplement potassium Check magnesium levels      Advance Care Planning:   Code Status: Full Code   Consults: Urology  Family Communication: Greater than 50% of time was spent discussing patient's condition and plan of care with patient at the bedside.  All questions and concerns have been addressed.  She verbalizes understanding and agrees with plan.  CODE STATUS was discussed and she wishes to be full code  Severity of Illness: The appropriate patient status for this patient is INPATIENT. Inpatient status is judged to be reasonable and necessary in order to provide the required intensity of service to ensure the patient's safety. The patient's presenting symptoms, physical exam findings, and initial radiographic and laboratory data in the context of their chronic comorbidities is felt to place them at high risk for further clinical deterioration. Furthermore, it is not anticipated that the patient will be medically stable for discharge from the hospital within 2 midnights of admission.   * I certify that at the point of admission it is my clinical judgment that the patient will require inpatient hospital care spanning beyond 2 midnights from the point of admission due to high intensity of service, high risk for further deterioration and high frequency of surveillance required.*  Author: Collier Bullock, MD 06/16/2022 10:59 AM  For on call review [email or web address].

## 2022-06-16 NOTE — ED Notes (Signed)
Pt to day surgery

## 2022-06-16 NOTE — Anesthesia Procedure Notes (Signed)
Procedure Name: LMA Insertion Date/Time: 06/16/2022 1:23 PM  Performed by: Adalberto Ill, CRNAPre-anesthesia Checklist: Emergency Drugs available, Suction available, Patient being monitored, Timeout performed and Patient identified Patient Re-evaluated:Patient Re-evaluated prior to induction Oxygen Delivery Method: Circle system utilized Preoxygenation: Pre-oxygenation with 100% oxygen Induction Type: IV induction Ventilation: Mask ventilation without difficulty LMA: LMA inserted LMA Size: 4.0 Tube type: Oral Number of attempts: 1 Placement Confirmation: positive ETCO2 and breath sounds checked- equal and bilateral Tube secured with: Tape Dental Injury: Teeth and Oropharynx as per pre-operative assessment

## 2022-06-16 NOTE — ED Triage Notes (Signed)
Pt BIB [facility] from home c/o lower back pain associated with NVD and hematuria that started yesterday night around 11 pm. EMS gave 4 zofran in route. 18G L Forearm. Hxk kidney stones. Denies Abdominal pain. No recent trauma/injury

## 2022-06-16 NOTE — Assessment & Plan Note (Addendum)
S/p cystoscopy and ureteral stent placement by urology. Patient still having gross hematuria.  Urology would like to keep the Foley catheter until urine clears and she is no more febrile. Patient will need a repeat procedure with ureteroscopy in 2 to 3 weeks which will be arranged as outpatient.

## 2022-06-16 NOTE — H&P (View-Only) (Signed)
Urology Consult  Requesting physician: Collier Bullock, MD   Reason for consultation: Obstructive kidney stone with left hydronephrosis  Chief Complaint: Kidney stone  History of Present Illness: Amanda Bruce is a 81 y.o. female who presented to the ED early this morning with severe left flank pain associated with nausea, vomiting, diarrhea, hematuria and chills.  CT remarkable for severe left hydronephrosis/hydroureter with a 4 mm left distal ureteral calculus.  Urinalysis with >50 WBC/RBC and leukocytosis at 15.3.  Prior history of stent placement January 2022 for an obstructing stone with sepsis.  Past Medical History:  Diagnosis Date   History of kidney stones    30+ years ago   Osteoporosis     Past Surgical History:  Procedure Laterality Date   CYSTOSCOPY WITH STENT PLACEMENT Left 07/08/2020   Procedure: CYSTOSCOPY WITH STENT PLACEMENT;  Surgeon: Abbie Sons, MD;  Location: [facility] ORS;  Service: Urology;  Laterality: Left;   CYSTOSCOPY/RETROGRADE/URETEROSCOPY Left 08/12/2020   Procedure: CYSTOSCOPY WITH URETEROSCOPY, with STENT removal;  Surgeon: Abbie Sons, MD;  Location: [facility] ORS;  Service: Urology;  Laterality: Left;    Home Medications:  Current Meds  Medication Sig   acetaminophen (TYLENOL) 325 MG tablet Take 325-650 mg by mouth every 6 (six) hours as needed (headaches.).   amLODipine (NORVASC) 5 MG tablet Take 1 tablet (5 mg total) by mouth daily.   aspirin EC 81 MG tablet Take 81 mg by mouth daily.   Biotin 5000 MCG TABS Take 5,000 mcg by mouth daily.   cholecalciferol (VITAMIN D3) 25 MCG (1000 UNIT) tablet Take 1,000 Units by mouth daily.   COLLAGEN PO Take 1,000 mg by mouth daily.   nystatin cream (MYCOSTATIN) SMARTSIG:1 sparingly Topical Twice Daily   tretinoin (RETIN-A) 0.05 % cream Apply topically at bedtime. (Patient taking differently: Apply 1 application  topically 3 (three) times a week.)   Vitamin A 2400 MCG (8000 UT) CAPS Take 2,400 mcg  by mouth daily.   vitamin C (ASCORBIC ACID) 500 MG tablet Take 500 mg by mouth daily.   Vitamin E 670 MG (1000 UT) CAPS Take 670 mg by mouth daily.    Allergies: No Known Allergies  Family History  Problem Relation Age of Onset   Hypertension Mother    Cancer Father    Hypertension Sister    Cancer Brother    Breast cancer Other     Social History:  reports that she has never smoked. She has never used smokeless tobacco. She reports that she does not drink alcohol and does not use drugs.  ROS: A complete review of systems was performed.  All systems are negative except for pertinent findings as noted.  Physical Exam:  Vital signs in last 24 hours: Temp:  [97.4 F (36.3 C)-98.6 F (37 C)] 98.6 F (37 C) (12/20 0747) Pulse Rate:  [77-86] 77 (12/20 0747) Resp:  [15-20] 15 (12/20 0747) BP: (122-152)/(83-106) 122/83 (12/20 0747) SpO2:  [94 %-99 %] 98 % (12/20 0747) Weight:  [54.7 kg] 54.7 kg (12/20 0439) Constitutional:  Alert, moderate distress secondary to flank pain HEENT: [place] AT Cardiovascular: Regular rate and rhythm Respiratory: Normal respiratory effort, lungs clear bilaterally GI: Abdomen is soft, nontender GU: Left CVA tenderness Skin: No rashes, bruises or suspicious lesions Lymph: No cervical or inguinal adenopathy Neurologic: Grossly intact, no focal deficits, moving all 4 extremities Psychiatric: Normal mood and affect   Laboratory Data:  Recent Labs    06/16/22 0445  WBC 15.3*  HGB 12.3  HCT 36.6   Recent Labs    06/16/22 0445  NA 140  K 3.3*  CL 109  CO2 23  GLUCOSE 150*  BUN 30*  CREATININE 0.51  CALCIUM 8.9   No results for input(s): "LABPT", "INR" in the last 72 hours. No results for input(s): "LABURIN" in the last 72 hours. Results for orders placed or performed during the hospital encounter of 06/16/22  Resp panel by RT-PCR (RSV, Flu A&B, Covid) Anterior Nasal Swab     Status: None   Collection Time: 06/16/22  4:45 AM   Specimen:  Anterior Nasal Swab  Result Value Ref Range Status   SARS Coronavirus 2 by RT PCR NEGATIVE NEGATIVE Final    Comment: (NOTE) SARS-CoV-2 target nucleic acids are NOT DETECTED.  The SARS-CoV-2 RNA is generally detectable in upper respiratory specimens during the acute phase of infection. The lowest concentration of SARS-CoV-2 viral copies this assay can detect is 138 copies/mL. A negative result does not preclude SARS-Cov-2 infection and should not be used as the sole basis for treatment or other patient management decisions. A negative result may occur with  improper specimen collection/handling, submission of specimen other than nasopharyngeal swab, presence of viral mutation(s) within the areas targeted by this assay, and inadequate number of viral copies(<138 copies/mL). A negative result must be combined with clinical observations, patient history, and epidemiological information. The expected result is Negative.  Fact Sheet for Patients:  EntrepreneurPulse.com.au  Fact Sheet for Healthcare Providers:  IncredibleEmployment.be  This test is no t yet approved or cleared by the [place] FDA and  has been authorized for detection and/or diagnosis of SARS-CoV-2 by FDA under an Emergency Use Authorization (EUA). This EUA will remain  in effect (meaning this test can be used) for the duration of the COVID-19 declaration under Section 564(b)(1) of the Act, 21 U.S.C.section 360bbb-3(b)(1), unless the authorization is terminated  or revoked sooner.       Influenza A by PCR NEGATIVE NEGATIVE Final   Influenza B by PCR NEGATIVE NEGATIVE Final    Comment: (NOTE) The Xpert Xpress SARS-CoV-2/FLU/RSV plus assay is intended as an aid in the diagnosis of influenza from Nasopharyngeal swab specimens and should not be used as a sole basis for treatment. Nasal washings and aspirates are unacceptable for Xpert Xpress SARS-CoV-2/FLU/RSV testing.  Fact  Sheet for Patients: EntrepreneurPulse.com.au  Fact Sheet for Healthcare Providers: IncredibleEmployment.be  This test is not yet approved or cleared by the [place] FDA and has been authorized for detection and/or diagnosis of SARS-CoV-2 by FDA under an Emergency Use Authorization (EUA). This EUA will remain in effect (meaning this test can be used) for the duration of the COVID-19 declaration under Section 564(b)(1) of the Act, 21 U.S.C. section 360bbb-3(b)(1), unless the authorization is terminated or revoked.     Resp Syncytial Virus by PCR NEGATIVE NEGATIVE Final    Comment: (NOTE) Fact Sheet for Patients: EntrepreneurPulse.com.au  Fact Sheet for Healthcare Providers: IncredibleEmployment.be  This test is not yet approved or cleared by the [place] FDA and has been authorized for detection and/or diagnosis of SARS-CoV-2 by FDA under an Emergency Use Authorization (EUA). This EUA will remain in effect (meaning this test can be used) for the duration of the COVID-19 declaration under Section 564(b)(1) of the Act, 21 U.S.C. section 360bbb-3(b)(1), unless the authorization is terminated or revoked.  Performed at [facility], [address] [place], [place] [address]      Radiologic Imaging: CT images personally reviewed  and interpreted  DG OR UROLOGY CYSTO IMAGE ([place])  Result Date: 06/16/2022 There is no interpretation for this exam.  This order is for images obtained during a surgical procedure.  Please See "Surgeries" Tab for more information regarding the procedure.   CT HEMATURIA WORKUP  Result Date: 06/16/2022 CLINICAL DATA:  Hematuria. EXAM: CT ABDOMEN AND PELVIS WITHOUT AND WITH CONTRAST TECHNIQUE: Multidetector CT imaging of the abdomen and pelvis was performed following the standard protocol before and following the bolus administration of intravenous contrast.  RADIATION DOSE REDUCTION: This exam was performed according to the departmental dose-optimization program which includes automated exposure control, adjustment of the mA and/or kV according to patient size and/or use of iterative reconstruction technique. CONTRAST:  161m OMNIPAQUE IOHEXOL 300 MG/ML  SOLN COMPARISON:  06/16/2022, earlier today. FINDINGS: Lower chest: No pleural fluid or airspace disease. Hepatobiliary: No suspicious liver lesion. Gallbladder appears normal. Fusiform scratch set mild fusiform increase caliber of the CBD measures up to 7 mm. Intrahepatic biliary prominence is noted. Pancreas: Unremarkable. No pancreatic ductal dilatation or surrounding inflammatory changes. Spleen: Normal in size without focal abnormality. Adrenals/Urinary Tract: Normal adrenal glands. Inferior pole right renal calculi are identified. Cluster of stones within the inferior pole measures up to 5 mm, image 38/5. No right-sided hydronephrosis or mass. Multiple left renal calcifications are identified. The largest is in the upper pole measuring 4 mm. Upper pole left kidney cyst is identified measuring 4.5 cm. Moderate left hydronephrosis and hydroureter with diffuse perinephric edema. Striated nephrographic appearance of the left renal cortex with delayed nephrogram noted. There is a stone(s) identified at the left UVJ which measures 4 mm, image 73/2. There is a hyperdense defect within the upper pole collecting system of the left kidney which measures 3.1 cm, image 28/10 and does not appear to enhance on the postcontrast images suggestive of underlying blood clot. Similarly, hyperdense filling defect within a posterior lower pole calyx is identified which also does not appear to enhance, image 28/2. There is increased density throughout the mid and distal dilated left ureter without definite signs of enhancement, image 54/2 through image 71/2. No suspicious filling defect identified within the urinary bladder.  Stomach/Bowel: Small hiatal hernia. Stomach appears normal. No dilated loops of large or small bowel. No bowel wall thickening or inflammation. Vascular/Lymphatic: Aortic atherosclerosis without aneurysm. No signs of abdominopelvic adenopathy. Reproductive: Uterus and bilateral adnexa are unremarkable. Other: No free fluid or fluid collections. Musculoskeletal: Severe degenerative changes are identified involving the right hip. No acute or suspicious osseous findings. Lumbar degenerative disc disease and scoliosis. IMPRESSION: 1. Moderate left hydronephrosis and hydroureter with diffuse perinephric edema. There is a 4 mm stone identified at the left UVJ. 2. Striated nephrographic appearance of the left kidney is identified on the delayed images which may reflect underlying pyelonephritis. 3. There are two, nonenhancing hyperdense filling defects within the dilated left upper and lower pole collecting system. There is also increased density throughout the mid and distal left ureter without definite signs of enhancement. Imaging findings are favored to represent blood clot. Absence of contrast material within the left renal collecting system and ureter on the delayed images diminishes sensitivity for detecting underlying urothelial lesion. Follow-up imaging is recommended to ensure clearance of these hyperdense filling defects and to exclude underlying urothelial neoplasm. 4. Bilateral nephrolithiasis. 5. Small hiatal hernia. 6. 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 7. Aortic Atherosclerosis (ICD10-I70.0). Electronically Signed   By: TKerby MoorsM.D.   On: 06/16/2022 08:13  CT Renal Stone Study  Result Date: 06/16/2022 CLINICAL DATA:  Back pain and hematuria. EXAM: CT ABDOMEN AND PELVIS WITHOUT CONTRAST TECHNIQUE: Multidetector CT imaging of the abdomen and pelvis was performed following the standard protocol without IV contrast. RADIATION DOSE REDUCTION: This exam was performed according to  the departmental dose-optimization program which includes automated exposure control, adjustment of the mA and/or kV according to patient size and/or use of iterative reconstruction technique. COMPARISON:  July 07, 2020 FINDINGS: Lower chest: No acute abnormality. Hepatobiliary: No focal liver abnormality is seen. No gallstones, gallbladder wall thickening, or biliary dilatation. Pancreas: Unremarkable. No pancreatic ductal dilatation or surrounding inflammatory changes. Spleen: Normal in size without focal abnormality. Adrenals/Urinary Tract: Adrenal glands are unremarkable. A 5.0 cm x 3.8 cm cyst is seen within the upper pole of the left kidney. This is mildly increased in size when compared to the prior study. Numerous bilateral subcentimeter non-obstructing renal calculi are present. Stable marked severity left-sided hydronephrosis and hydroureter are seen, to the level of the distal left ureter. Stable left-sided perinephric inflammatory fat stranding is also seen. A 14 mm x 12 mm x 28 mm area of increased attenuation (approximately 68.19 Hounsfield units) is seen within an upper pole calyx of the left kidney (axial CT images 24 through 30, CT series 2/coronal reformatted image 47/CT series 5). This extends to the level of the left renal pelvis and represents a new finding when compared to the prior study. A similar appearing area of increased attenuation (approximately 75.00 Hounsfield units) is seen throughout a large portion of the left ureter. An obstructing renal calculus is not clearly identified. Bladder is unremarkable. Stomach/Bowel: There is a small hiatal hernia. Appendix appears normal. Stool is seen throughout the large bowel. No evidence of bowel wall thickening, distention, or inflammatory changes. Vascular/Lymphatic: Aortic atherosclerosis. No enlarged abdominal or pelvic lymph nodes. Reproductive: Uterus and bilateral adnexa are unremarkable. Other: No abdominal wall hernia or abnormality.  No abdominopelvic ascites. Musculoskeletal: Multilevel degenerative changes seen throughout the lumbar spine. IMPRESSION: 1. Stable marked severity left-sided hydronephrosis and hydroureter, to the level of the distal left ureter, without visualization of an obstructing ureteral calculus. 2. Findings which may represent acute blood within the left ureter and upper pole calyx of the left kidney. A hemorrhagic lesion within the upper pole of the left kidney cannot be excluded. Further evaluation with CT or MRI (without and with IV contrast) and urology consult is recommended. 3. Numerous bilateral subcentimeter non-obstructing renal calculi. 4. Large left renal cyst. This may represent a large simple cyst, however, given the additional findings stated above, an associated soft tissue component cannot be excluded. CT or MRI correlation is again recommended for improved characterization. 5. Small hiatal hernia. 6. Aortic atherosclerosis. Aortic Atherosclerosis (ICD10-I70.0). Electronically Signed   By: Virgina Norfolk M.D.   On: 06/16/2022 05:58    Impression/Assessment:  81 y.o. female with an obstructing left distal ureteral calculus, leukocytosis and pyuria.  Prior history of sepsis secondary to an obstructing stone  Plan:  Add on cystoscopy with left ureteral stent placement.  The procedure was discussed in detail including potential risks of bleeding, worsening sepsis.  The possible need for percutaneous nephrostomy tube if stent placement unsuccessful was discussed though this is unlikely.  All questions were answered and she desires to proceed.    06/16/2022, 12:32 PM  [name],  MD

## 2022-06-16 NOTE — Assessment & Plan Note (Signed)
Secondary to nephrolithiasis with obstruction resulting in moderate left hydronephrosis and hydroureter with diffuse perinephric edema. Patient has left CVA tenderness with marked leukocytosis Urine cultures with 20,000 colonies of E. Coli Urology is on board Continue empiric antibiotic therapy with Rocephin IV fluid hydration

## 2022-06-16 NOTE — ED Notes (Signed)
Patient transported to CT 

## 2022-06-16 NOTE — ED Provider Notes (Addendum)
[facility] Provider Note    Event Date/Time   First MD Initiated Contact with Patient 06/16/22 [phone]     (approximate)   History   Chief Complaint: Back Pain   HPI  Amanda Bruce is a 81 y.o. female with a history of kidney stones, diastolic CHF who is brought to the ED due to lower back pain with nausea vomiting diarrhea and hematuria that started yesterday evening around 11 PM.  Denies chest pain shortness of breath fever or syncope.  No anticoagulants.  Reports a history of kidney stones.     Physical Exam   Triage Vital Signs: ED Triage Vitals  Enc Vitals Group     BP 06/16/22 0440 (!) 152/97     Pulse Rate 06/16/22 0440 82     Resp 06/16/22 0441 20     Temp 06/16/22 0444 (!) 97.4 F (36.3 C)     Temp Source 06/16/22 0444 Oral     SpO2 06/16/22 0440 99 %     Weight 06/16/22 0439 120 lb 9.5 oz (54.7 kg)     Height 06/16/22 0439 5' 7.01" (1.702 m)     Head Circumference --      Peak Flow --      Pain Score 06/16/22 0438 10     Pain Loc --      Pain Edu? --      Excl. in [place]? --     Most recent vital signs: Vitals:   06/16/22 0611 06/16/22 0649  BP:  (!) 149/90  Pulse: 80 86  Resp: 17 16  Temp:    SpO2: 94% 98%    General: Awake, no distress.  CV:  Good peripheral perfusion.  Regular rate rhythm Resp:  Normal effort.  Clear to auscultation bilaterally Abd:  No distention.  Soft with suprapubic tenderness Other:  No lower extremity edema.   ED Results / Procedures / Treatments   Labs (all labs ordered are listed, but only abnormal results are displayed) Labs Reviewed  COMPREHENSIVE METABOLIC PANEL - Abnormal; Notable for the following components:      Result Value   Potassium 3.3 (*)    Glucose, Bld 150 (*)    BUN 30 (*)    All other components within normal limits  CBC WITH DIFFERENTIAL/PLATELET - Abnormal; Notable for the following components:   WBC 15.3 (*)    RBC 3.84 (*)    Neutro Abs 13.4 (*)    Lymphs Abs  0.5 (*)    Monocytes Absolute 1.3 (*)    All other components within normal limits  URINALYSIS, ROUTINE W REFLEX MICROSCOPIC - Abnormal; Notable for the following components:   Color, Urine RED (*)    APPearance BLOODY (*)    Glucose, UA   (*)    Value: TEST NOT REPORTED DUE TO COLOR INTERFERENCE OF URINE PIGMENT   Hgb urine dipstick   (*)    Value: TEST NOT REPORTED DUE TO COLOR INTERFERENCE OF URINE PIGMENT   Bilirubin Urine   (*)    Value: TEST NOT REPORTED DUE TO COLOR INTERFERENCE OF URINE PIGMENT   Ketones, ur   (*)    Value: TEST NOT REPORTED DUE TO COLOR INTERFERENCE OF URINE PIGMENT   Protein, ur   (*)    Value: TEST NOT REPORTED DUE TO COLOR INTERFERENCE OF URINE PIGMENT   Nitrite   (*)    Value: TEST NOT REPORTED DUE TO COLOR INTERFERENCE OF URINE PIGMENT  Leukocytes,Ua   (*)    Value: TEST NOT REPORTED DUE TO COLOR INTERFERENCE OF URINE PIGMENT   RBC / HPF >50 (*)    WBC, UA >50 (*)    All other components within normal limits  RESP PANEL BY RT-PCR (RSV, FLU A&B, COVID)  [id number]  URINE CULTURE  LIPASE, BLOOD     EKG    RADIOLOGY CT abdomen pelvis without contrast interpreted by me, negative for kidney stone or diverticulitis.  Radiology report reviewed noting possible hemorrhagic lesion in the left kidney.   PROCEDURES:  Procedures   MEDICATIONS ORDERED IN ED: Medications  cefTRIAXone (ROCEPHIN) 1 g in sodium chloride 0.9 % 100 mL IVPB (1 g Intravenous New Bag/Given 06/16/22 0631)  sodium chloride 0.9 % bolus 1,000 mL (0 mLs Intravenous Stopped 06/16/22 0606)  ondansetron (ZOFRAN) injection 4 mg (4 mg Intravenous Given 06/16/22 0648)  morphine (PF) 4 MG/ML injection 4 mg (4 mg Intravenous Given 06/16/22 0648)     IMPRESSION / MDM / ASSESSMENT AND PLAN / ED COURSE  I reviewed the triage vital signs and the nursing notes.                              Differential diagnosis includes, but is not limited to, kidney stone, cystitis/pyelonephritis,  diverticulitis, intra-abdominal abscess  Patient's presentation is most consistent with acute presentation with potential threat to life or bodily function.  Patient presents with hematuria, suprapubic tenderness.  Vital signs unremarkable.  She does have a history of complicated ureteral obstructions requiring stents in the past.  With flank pain, worry for more complicated urologic disease.  Labs show leukocytosis of 15,000.  CT renal study negative for stone but does show suggest a possible hemorrhagic lesion of the left kidney, hematuria workup imaging advised.  Discussed with urology who recommends CT hematuria protocol.  Will obtain CT, give rocephin, and callback urology Dr. Bernardo Heater for advice on CT result.     FINAL CLINICAL IMPRESSION(S) / ED DIAGNOSES   Final diagnoses:  Gross hematuria     Rx / DC Orders   ED Discharge Orders     None        Note:  This document was prepared using Dragon voice recognition software and may include unintentional dictation errors.   Carrie Mew, MD 06/16/22 6283    Carrie Mew, MD 06/16/22 0700

## 2022-06-16 NOTE — Anesthesia Preprocedure Evaluation (Signed)
Anesthesia Evaluation  Patient identified by MRN, date of birth, ID band Patient awake    Reviewed: Allergy & Precautions, H&P , NPO status , Patient's Chart, lab work & pertinent test results  History of Anesthesia Complications Negative for: history of anesthetic complications  Airway Mallampati: II  TM Distance: >3 FB Neck ROM: full    Dental  (+) Poor Dentition, Partial Lower, Partial Upper, Dental Advidsory Given   Pulmonary neg pulmonary ROS, neg shortness of breath, neg sleep apnea, neg recent URI   Pulmonary exam normal breath sounds clear to auscultation       Cardiovascular Exercise Tolerance: Good hypertension, On Medications (-) angina +CHF  (-) Past MI, (-) Cardiac Stents and (-) DOE Normal cardiovascular exam(-) dysrhythmias (-) Valvular Problems/Murmurs Rhythm:Regular Rate:Normal  TTE 2022: 1. Left ventricular ejection fraction, by estimation, is 55 to 60%. The  left ventricle has normal function. The left ventricle has no regional  wall motion abnormalities. Left ventricular diastolic parameters are  consistent with Grade I diastolic  dysfunction (impaired relaxation).  2. Right ventricular systolic function is normal. The right ventricular  size is normal. There is mildly elevated pulmonary artery systolic  pressure.  3. The mitral valve is normal in structure. Mild mitral valve  regurgitation.  4. The aortic valve is tricuspid. Aortic valve regurgitation is not  visualized.  5. The inferior vena cava is dilated in size with <50% respiratory  variability, suggesting right atrial pressure of 15 mmHg.    Neuro/Psych Seizures: .  negative neurological ROS  negative psych ROS   GI/Hepatic negative GI ROS, Neg liver ROS,neg GERD  ,,  Endo/Other  negative endocrine ROS    Renal/GU Renal disease (kidney stones)     Musculoskeletal  (+) Arthritis ,    Abdominal   Peds  Hematology negative  hematology ROS (+)   Anesthesia Other Findings  Past Medical History: No date: History of kidney stones     Comment:  30+ years ago No date: Osteoporosis  History reviewed. No pertinent surgical history.  BMI    Body Mass Index: 17.23 kg/m      Reproductive/Obstetrics negative OB ROS                             Anesthesia Physical Anesthesia Plan  ASA: 2  Anesthesia Plan: General   Post-op Pain Management:    Induction: Intravenous  PONV Risk Score and Plan: 4 or greater and Propofol infusion, TIVA and Treatment may vary due to age or medical condition  Airway Management Planned: Oral ETT  Additional Equipment: None  Intra-op Plan:   Post-operative Plan: Extubation in OR  Informed Consent: I have reviewed the patients History and Physical, chart, labs and discussed the procedure including the risks, benefits and alternatives for the proposed anesthesia with the patient or authorized representative who has indicated his/her understanding and acceptance.     Dental Advisory Given  Plan Discussed with: Anesthesiologist, CRNA and Surgeon  Anesthesia Plan Comments: (Patient consented for risks of anesthesia including but not limited to:  - adverse reactions to medications - damage to eyes, teeth, lips or other oral mucosa - nerve damage due to positioning  - sore throat or hoarseness - Damage to heart, brain, nerves, lungs, other parts of body or loss of life  Patient voiced understanding.)        Anesthesia Quick Evaluation

## 2022-06-16 NOTE — Consult Note (Signed)
Urology Consult  Requesting physician: Collier Bullock, MD   Reason for consultation: Obstructive kidney stone with left hydronephrosis  Chief Complaint: Kidney stone  History of Present Illness: Amanda Bruce is a 81 y.o. female who presented to the ED early this morning with severe left flank pain associated with nausea, vomiting, diarrhea, hematuria and chills.  CT remarkable for severe left hydronephrosis/hydroureter with a 4 mm left distal ureteral calculus.  Urinalysis with >50 WBC/RBC and leukocytosis at 15.3.  Prior history of stent placement January 2022 for an obstructing stone with sepsis.  Past Medical History:  Diagnosis Date   History of kidney stones    30+ years ago   Osteoporosis     Past Surgical History:  Procedure Laterality Date   CYSTOSCOPY WITH STENT PLACEMENT Left 07/08/2020   Procedure: CYSTOSCOPY WITH STENT PLACEMENT;  Surgeon: Abbie Sons, MD;  Location: [facility] ORS;  Service: Urology;  Laterality: Left;   CYSTOSCOPY/RETROGRADE/URETEROSCOPY Left 08/12/2020   Procedure: CYSTOSCOPY WITH URETEROSCOPY, with STENT removal;  Surgeon: Abbie Sons, MD;  Location: [facility] ORS;  Service: Urology;  Laterality: Left;    Home Medications:  Current Meds  Medication Sig   acetaminophen (TYLENOL) 325 MG tablet Take 325-650 mg by mouth every 6 (six) hours as needed (headaches.).   amLODipine (NORVASC) 5 MG tablet Take 1 tablet (5 mg total) by mouth daily.   aspirin EC 81 MG tablet Take 81 mg by mouth daily.   Biotin 5000 MCG TABS Take 5,000 mcg by mouth daily.   cholecalciferol (VITAMIN D3) 25 MCG (1000 UNIT) tablet Take 1,000 Units by mouth daily.   COLLAGEN PO Take 1,000 mg by mouth daily.   nystatin cream (MYCOSTATIN) SMARTSIG:1 sparingly Topical Twice Daily   tretinoin (RETIN-A) 0.05 % cream Apply topically at bedtime. (Patient taking differently: Apply 1 application  topically 3 (three) times a week.)   Vitamin A 2400 MCG (8000 UT) CAPS Take 2,400 mcg  by mouth daily.   vitamin C (ASCORBIC ACID) 500 MG tablet Take 500 mg by mouth daily.   Vitamin E 670 MG (1000 UT) CAPS Take 670 mg by mouth daily.    Allergies: No Known Allergies  Family History  Problem Relation Age of Onset   Hypertension Mother    Cancer Father    Hypertension Sister    Cancer Brother    Breast cancer Other     Social History:  reports that she has never smoked. She has never used smokeless tobacco. She reports that she does not drink alcohol and does not use drugs.  ROS: A complete review of systems was performed.  All systems are negative except for pertinent findings as noted.  Physical Exam:  Vital signs in last 24 hours: Temp:  [97.4 F (36.3 C)-98.6 F (37 C)] 98.6 F (37 C) (12/20 0747) Pulse Rate:  [77-86] 77 (12/20 0747) Resp:  [15-20] 15 (12/20 0747) BP: (122-152)/(83-106) 122/83 (12/20 0747) SpO2:  [94 %-99 %] 98 % (12/20 0747) Weight:  [54.7 kg] 54.7 kg (12/20 0439) Constitutional:  Alert, moderate distress secondary to flank pain HEENT: [place] AT Cardiovascular: Regular rate and rhythm Respiratory: Normal respiratory effort, lungs clear bilaterally GI: Abdomen is soft, nontender GU: Left CVA tenderness Skin: No rashes, bruises or suspicious lesions Lymph: No cervical or inguinal adenopathy Neurologic: Grossly intact, no focal deficits, moving all 4 extremities Psychiatric: Normal mood and affect   Laboratory Data:  Recent Labs    06/16/22 0445  WBC 15.3*  HGB 12.3  HCT 36.6   Recent Labs    06/16/22 0445  NA 140  K 3.3*  CL 109  CO2 23  GLUCOSE 150*  BUN 30*  CREATININE 0.51  CALCIUM 8.9   No results for input(s): "LABPT", "INR" in the last 72 hours. No results for input(s): "LABURIN" in the last 72 hours. Results for orders placed or performed during the hospital encounter of 06/16/22  Resp panel by RT-PCR (RSV, Flu A&B, Covid) Anterior Nasal Swab     Status: None   Collection Time: 06/16/22  4:45 AM   Specimen:  Anterior Nasal Swab  Result Value Ref Range Status   SARS Coronavirus 2 by RT PCR NEGATIVE NEGATIVE Final    Comment: (NOTE) SARS-CoV-2 target nucleic acids are NOT DETECTED.  The SARS-CoV-2 RNA is generally detectable in upper respiratory specimens during the acute phase of infection. The lowest concentration of SARS-CoV-2 viral copies this assay can detect is 138 copies/mL. A negative result does not preclude SARS-Cov-2 infection and should not be used as the sole basis for treatment or other patient management decisions. A negative result may occur with  improper specimen collection/handling, submission of specimen other than nasopharyngeal swab, presence of viral mutation(s) within the areas targeted by this assay, and inadequate number of viral copies(<138 copies/mL). A negative result must be combined with clinical observations, patient history, and epidemiological information. The expected result is Negative.  Fact Sheet for Patients:  EntrepreneurPulse.com.au  Fact Sheet for Healthcare Providers:  IncredibleEmployment.be  This test is no t yet approved or cleared by the [place] FDA and  has been authorized for detection and/or diagnosis of SARS-CoV-2 by FDA under an Emergency Use Authorization (EUA). This EUA will remain  in effect (meaning this test can be used) for the duration of the COVID-19 declaration under Section 564(b)(1) of the Act, 21 U.S.C.section 360bbb-3(b)(1), unless the authorization is terminated  or revoked sooner.       Influenza A by PCR NEGATIVE NEGATIVE Final   Influenza B by PCR NEGATIVE NEGATIVE Final    Comment: (NOTE) The Xpert Xpress SARS-CoV-2/FLU/RSV plus assay is intended as an aid in the diagnosis of influenza from Nasopharyngeal swab specimens and should not be used as a sole basis for treatment. Nasal washings and aspirates are unacceptable for Xpert Xpress SARS-CoV-2/FLU/RSV testing.  Fact  Sheet for Patients: EntrepreneurPulse.com.au  Fact Sheet for Healthcare Providers: IncredibleEmployment.be  This test is not yet approved or cleared by the [place] FDA and has been authorized for detection and/or diagnosis of SARS-CoV-2 by FDA under an Emergency Use Authorization (EUA). This EUA will remain in effect (meaning this test can be used) for the duration of the COVID-19 declaration under Section 564(b)(1) of the Act, 21 U.S.C. section 360bbb-3(b)(1), unless the authorization is terminated or revoked.     Resp Syncytial Virus by PCR NEGATIVE NEGATIVE Final    Comment: (NOTE) Fact Sheet for Patients: EntrepreneurPulse.com.au  Fact Sheet for Healthcare Providers: IncredibleEmployment.be  This test is not yet approved or cleared by the [place] FDA and has been authorized for detection and/or diagnosis of SARS-CoV-2 by FDA under an Emergency Use Authorization (EUA). This EUA will remain in effect (meaning this test can be used) for the duration of the COVID-19 declaration under Section 564(b)(1) of the Act, 21 U.S.C. section 360bbb-3(b)(1), unless the authorization is terminated or revoked.  Performed at [facility], [address], [place], [place] [address]      Radiologic Imaging: CT images personally reviewed  and interpreted  DG OR UROLOGY CYSTO IMAGE ([place])  Result Date: 06/16/2022 There is no interpretation for this exam.  This order is for images obtained during a surgical procedure.  Please See "Surgeries" Tab for more information regarding the procedure.   CT HEMATURIA WORKUP  Result Date: 06/16/2022 CLINICAL DATA:  Hematuria. EXAM: CT ABDOMEN AND PELVIS WITHOUT AND WITH CONTRAST TECHNIQUE: Multidetector CT imaging of the abdomen and pelvis was performed following the standard protocol before and following the bolus administration of intravenous contrast.  RADIATION DOSE REDUCTION: This exam was performed according to the departmental dose-optimization program which includes automated exposure control, adjustment of the mA and/or kV according to patient size and/or use of iterative reconstruction technique. CONTRAST:  122m OMNIPAQUE IOHEXOL 300 MG/ML  SOLN COMPARISON:  06/16/2022, earlier today. FINDINGS: Lower chest: No pleural fluid or airspace disease. Hepatobiliary: No suspicious liver lesion. Gallbladder appears normal. Fusiform scratch set mild fusiform increase caliber of the CBD measures up to 7 mm. Intrahepatic biliary prominence is noted. Pancreas: Unremarkable. No pancreatic ductal dilatation or surrounding inflammatory changes. Spleen: Normal in size without focal abnormality. Adrenals/Urinary Tract: Normal adrenal glands. Inferior pole right renal calculi are identified. Cluster of stones within the inferior pole measures up to 5 mm, image 38/5. No right-sided hydronephrosis or mass. Multiple left renal calcifications are identified. The largest is in the upper pole measuring 4 mm. Upper pole left kidney cyst is identified measuring 4.5 cm. Moderate left hydronephrosis and hydroureter with diffuse perinephric edema. Striated nephrographic appearance of the left renal cortex with delayed nephrogram noted. There is a stone(s) identified at the left UVJ which measures 4 mm, image 73/2. There is a hyperdense defect within the upper pole collecting system of the left kidney which measures 3.1 cm, image 28/10 and does not appear to enhance on the postcontrast images suggestive of underlying blood clot. Similarly, hyperdense filling defect within a posterior lower pole calyx is identified which also does not appear to enhance, image 28/2. There is increased density throughout the mid and distal dilated left ureter without definite signs of enhancement, image 54/2 through image 71/2. No suspicious filling defect identified within the urinary bladder.  Stomach/Bowel: Small hiatal hernia. Stomach appears normal. No dilated loops of large or small bowel. No bowel wall thickening or inflammation. Vascular/Lymphatic: Aortic atherosclerosis without aneurysm. No signs of abdominopelvic adenopathy. Reproductive: Uterus and bilateral adnexa are unremarkable. Other: No free fluid or fluid collections. Musculoskeletal: Severe degenerative changes are identified involving the right hip. No acute or suspicious osseous findings. Lumbar degenerative disc disease and scoliosis. IMPRESSION: 1. Moderate left hydronephrosis and hydroureter with diffuse perinephric edema. There is a 4 mm stone identified at the left UVJ. 2. Striated nephrographic appearance of the left kidney is identified on the delayed images which may reflect underlying pyelonephritis. 3. There are two, nonenhancing hyperdense filling defects within the dilated left upper and lower pole collecting system. There is also increased density throughout the mid and distal left ureter without definite signs of enhancement. Imaging findings are favored to represent blood clot. Absence of contrast material within the left renal collecting system and ureter on the delayed images diminishes sensitivity for detecting underlying urothelial lesion. Follow-up imaging is recommended to ensure clearance of these hyperdense filling defects and to exclude underlying urothelial neoplasm. 4. Bilateral nephrolithiasis. 5. Small hiatal hernia. 6. 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 7. Aortic Atherosclerosis (ICD10-I70.0). Electronically Signed   By: TKerby MoorsM.D.   On: 06/16/2022 08:13  CT Renal Stone Study  Result Date: 06/16/2022 CLINICAL DATA:  Back pain and hematuria. EXAM: CT ABDOMEN AND PELVIS WITHOUT CONTRAST TECHNIQUE: Multidetector CT imaging of the abdomen and pelvis was performed following the standard protocol without IV contrast. RADIATION DOSE REDUCTION: This exam was performed according to  the departmental dose-optimization program which includes automated exposure control, adjustment of the mA and/or kV according to patient size and/or use of iterative reconstruction technique. COMPARISON:  July 07, 2020 FINDINGS: Lower chest: No acute abnormality. Hepatobiliary: No focal liver abnormality is seen. No gallstones, gallbladder wall thickening, or biliary dilatation. Pancreas: Unremarkable. No pancreatic ductal dilatation or surrounding inflammatory changes. Spleen: Normal in size without focal abnormality. Adrenals/Urinary Tract: Adrenal glands are unremarkable. A 5.0 cm x 3.8 cm cyst is seen within the upper pole of the left kidney. This is mildly increased in size when compared to the prior study. Numerous bilateral subcentimeter non-obstructing renal calculi are present. Stable marked severity left-sided hydronephrosis and hydroureter are seen, to the level of the distal left ureter. Stable left-sided perinephric inflammatory fat stranding is also seen. A 14 mm x 12 mm x 28 mm area of increased attenuation (approximately 68.19 Hounsfield units) is seen within an upper pole calyx of the left kidney (axial CT images 24 through 30, CT series 2/coronal reformatted image 47/CT series 5). This extends to the level of the left renal pelvis and represents a new finding when compared to the prior study. A similar appearing area of increased attenuation (approximately 75.00 Hounsfield units) is seen throughout a large portion of the left ureter. An obstructing renal calculus is not clearly identified. Bladder is unremarkable. Stomach/Bowel: There is a small hiatal hernia. Appendix appears normal. Stool is seen throughout the large bowel. No evidence of bowel wall thickening, distention, or inflammatory changes. Vascular/Lymphatic: Aortic atherosclerosis. No enlarged abdominal or pelvic lymph nodes. Reproductive: Uterus and bilateral adnexa are unremarkable. Other: No abdominal wall hernia or abnormality.  No abdominopelvic ascites. Musculoskeletal: Multilevel degenerative changes seen throughout the lumbar spine. IMPRESSION: 1. Stable marked severity left-sided hydronephrosis and hydroureter, to the level of the distal left ureter, without visualization of an obstructing ureteral calculus. 2. Findings which may represent acute blood within the left ureter and upper pole calyx of the left kidney. A hemorrhagic lesion within the upper pole of the left kidney cannot be excluded. Further evaluation with CT or MRI (without and with IV contrast) and urology consult is recommended. 3. Numerous bilateral subcentimeter non-obstructing renal calculi. 4. Large left renal cyst. This may represent a large simple cyst, however, given the additional findings stated above, an associated soft tissue component cannot be excluded. CT or MRI correlation is again recommended for improved characterization. 5. Small hiatal hernia. 6. Aortic atherosclerosis. Aortic Atherosclerosis (ICD10-I70.0). Electronically Signed   By: Virgina Norfolk M.D.   On: 06/16/2022 05:58    Impression/Assessment:  81 y.o. female with an obstructing left distal ureteral calculus, leukocytosis and pyuria.  Prior history of sepsis secondary to an obstructing stone  Plan:  Add on cystoscopy with left ureteral stent placement.  The procedure was discussed in detail including potential risks of bleeding, worsening sepsis.  The possible need for percutaneous nephrostomy tube if stent placement unsuccessful was discussed though this is unlikely.  All questions were answered and she desires to proceed.    06/16/2022, 12:32 PM  [name],  MD

## 2022-06-16 NOTE — Assessment & Plan Note (Addendum)
Supplement potassium Check magnesium levels 

## 2022-06-17 ENCOUNTER — Encounter: Payer: Self-pay | Admitting: Urology

## 2022-06-17 ENCOUNTER — Other Ambulatory Visit: Payer: Self-pay | Admitting: Physician Assistant

## 2022-06-17 DIAGNOSIS — N201 Calculus of ureter: Secondary | ICD-10-CM

## 2022-06-17 DIAGNOSIS — N132 Hydronephrosis with renal and ureteral calculous obstruction: Secondary | ICD-10-CM

## 2022-06-17 DIAGNOSIS — R310 Gross hematuria: Secondary | ICD-10-CM | POA: Diagnosis not present

## 2022-06-17 DIAGNOSIS — E876 Hypokalemia: Secondary | ICD-10-CM

## 2022-06-17 DIAGNOSIS — I10 Essential (primary) hypertension: Secondary | ICD-10-CM

## 2022-06-17 DIAGNOSIS — E43 Unspecified severe protein-calorie malnutrition: Secondary | ICD-10-CM

## 2022-06-17 DIAGNOSIS — N12 Tubulo-interstitial nephritis, not specified as acute or chronic: Secondary | ICD-10-CM | POA: Diagnosis not present

## 2022-06-17 DIAGNOSIS — A419 Sepsis, unspecified organism: Secondary | ICD-10-CM

## 2022-06-17 LAB — CBC
HCT: 31.6 % — ABNORMAL LOW (ref 36.0–46.0)
Hemoglobin: 10.2 g/dL — ABNORMAL LOW (ref 12.0–15.0)
MCH: 31.2 pg (ref 26.0–34.0)
MCHC: 32.3 g/dL (ref 30.0–36.0)
MCV: 96.6 fL (ref 80.0–100.0)
Platelets: 202 10*3/uL (ref 150–400)
RBC: 3.27 MIL/uL — ABNORMAL LOW (ref 3.87–5.11)
RDW: 12.3 % (ref 11.5–15.5)
WBC: 10.6 10*3/uL — ABNORMAL HIGH (ref 4.0–10.5)
nRBC: 0 % (ref 0.0–0.2)

## 2022-06-17 LAB — BASIC METABOLIC PANEL
Anion gap: 7 (ref 5–15)
BUN: 19 mg/dL (ref 8–23)
CO2: 26 mmol/L (ref 22–32)
Calcium: 8.2 mg/dL — ABNORMAL LOW (ref 8.9–10.3)
Chloride: 107 mmol/L (ref 98–111)
Creatinine, Ser: 0.54 mg/dL (ref 0.44–1.00)
GFR, Estimated: >60 mL/min (ref >60)
Glucose, Bld: 100 mg/dL — ABNORMAL HIGH (ref 70–99)
Potassium: 3.8 mmol/L (ref 3.5–5.1)
Sodium: 140 mmol/L (ref 135–145)

## 2022-06-17 NOTE — TOC Initial Note (Signed)
Transition of Care [facility]) - Initial/Assessment Note    Patient Details  Name: Amanda Bruce MRN: [id number] Date of Birth: December 03, 1940  [facility]) CM/SW Contact:    Beverly Sessions, RN Phone Number: 06/17/2022, 1:45 PM  Clinical Narrative:                   Transition of Care [facility]) Screening Note   Patient Details  Name: Amanda Bruce Date of Birth: [birth year]   Transition of Care [facility]) CM/SW Contact:    Beverly Sessions, RN Phone Number: 06/17/2022, 1:45 PM    Transition of Care Department [facility]) has reviewed patient and no TOC needs have been identified at this time. We will continue to monitor patient advancement through interdisciplinary progression rounds. If new patient transition needs arise, please place a TOC consult.   No PCP noted on file.  List of Local PCP added to AVS       Patient Goals and CMS Choice            Expected Discharge Plan and Services                                                Prior Living Arrangements/Services                       Activities of Daily Living Home Assistive Devices/Equipment: None ADL Screening (condition at time of admission) Patient's cognitive ability adequate to safely complete daily activities?: Yes Is the patient deaf or have difficulty hearing?: No Does the patient have difficulty seeing, even when wearing glasses/contacts?: No Does the patient have difficulty concentrating, remembering, or making decisions?: No Patient able to express need for assistance with ADLs?: Yes Does the patient have difficulty dressing or bathing?: No Independently performs ADLs?: Yes (appropriate for developmental age) Does the patient have difficulty walking or climbing stairs?: No Weakness of Legs: None Weakness of Arms/Hands: None  Permission Sought/Granted                  Emotional Assessment              Admission diagnosis:  Gross hematuria  [R31.0] Pyelonephritis of left kidney [N12] Patient Active Problem List   Diagnosis Date Noted   Pyelonephritis of left kidney 06/16/2022   Hydronephrosis concurrent with and due to calculi of kidney and ureter 06/16/2022   Hypokalemia 06/16/2022   Primary hypertension 02/24/2022   Chronic diastolic CHF (congestive heart failure) ([place]) 09/32/6712   Acute diastolic CHF (congestive heart failure) ([place]) 07/10/2020   Protein-calorie malnutrition, severe 07/10/2020   Right leg weakness 07/08/2020   Ureteral obstruction, left 07/08/2020   Cardiomegaly 07/08/2020   Cough 07/03/2020   Memory deficit 06/14/2019   Leg cramps, sleep related 06/12/2018   Advanced care planning/counseling discussion 06/14/2017   Hypercholesteremia 06/08/2016   Osteoarthritis of both knees 06/05/2015   Osteoporosis    PCP:  Pcp, No Pharmacy:   [facility], [place] - [place] [address] [place] [address] Phone: [phone] Fax: [phone]     Social Determinants of Health (SDOH) Social History: SDOH Screenings   Food Insecurity: No Food Insecurity (06/16/2022)  Housing: Low Risk  (06/16/2022)  Transportation Needs: No Transportation Needs (06/16/2022)  Utilities: Not At Risk (06/16/2022)  Depression (PHQ2-9): Low Risk  (02/08/2022)  Tobacco Use: Low Risk  (06/17/2022)   SDOH Interventions:     Readmission Risk Interventions     No data to display

## 2022-06-17 NOTE — Progress Notes (Signed)
Urology Inpatient Progress Note  Subjective: No acute events overnight.  She is afebrile, VSS. Creatinine stable, 0.54.  WBC count down, 10.6.  Urine culture pending, on antibiotics as below. Today she reports she is feeling better with no pain. Foley catheter in place draining thin, red-brown urine.  Anti-infectives: Anti-infectives (From admission, onward)    Start     Dose/Rate Route Frequency Ordered Stop   06/17/22 0600  cefTRIAXone (ROCEPHIN) 1 g in sodium chloride 0.9 % 100 mL IVPB        1 g 200 mL/hr over 30 Minutes Intravenous Every 24 hours 06/16/22 1033     06/16/22 0630  cefTRIAXone (ROCEPHIN) 1 g in sodium chloride 0.9 % 100 mL IVPB        1 g 200 mL/hr over 30 Minutes Intravenous  Once 06/16/22 0175 06/16/22 1025       Current Facility-Administered Medications  Medication Dose Route Frequency Provider Last Rate Last Admin   acetaminophen (TYLENOL) tablet 325-650 mg  325-650 mg Oral Q6H PRN Agbata, Tochukwu, MD   650 mg at 06/16/22 2021   ascorbic acid (VITAMIN C) tablet 500 mg  500 mg Oral Daily Agbata, Tochukwu, MD       cefTRIAXone (ROCEPHIN) 1 g in sodium chloride 0.9 % 100 mL IVPB  1 g Intravenous Q24H Agbata, Tochukwu, MD 200 mL/hr at 06/17/22 0517 1 g at 06/17/22 0517   lactated ringers infusion   Intravenous Continuous Agbata, Tochukwu, MD 125 mL/hr at 06/17/22 0052 New Bag at 06/17/22 0052   morphine (PF) 2 MG/ML injection 2 mg  2 mg Intravenous Q4H PRN Agbata, Tochukwu, MD       ondansetron (ZOFRAN) tablet 4 mg  4 mg Oral Q6H PRN Agbata, Tochukwu, MD       Or   ondansetron (ZOFRAN) injection 4 mg  4 mg Intravenous Q6H PRN Agbata, Tochukwu, MD       Objective: Vital signs in last 24 hours: Temp:  [98.3 F (36.8 C)-100.7 F (38.2 C)] 98.9 F (37.2 C) (12/21 0516) Pulse Rate:  [80-115] 80 (12/21 0516) Resp:  [12-35] 18 (12/21 0516) BP: (104-154)/(63-91) 124/71 (12/21 0516) SpO2:  [92 %-98 %] 98 % (12/21 0516)  Intake/Output from previous day: 12/20  0701 - 12/21 0700 In: 2539 [P.O.:360; I.V.:2079; IV Piggyback:100] Out: 1100 [Urine:1100] Intake/Output this shift: No intake/output data recorded.  Physical Exam Vitals and nursing note reviewed.  Constitutional:      General: She is not in acute distress.    Appearance: She is not ill-appearing, toxic-appearing or diaphoretic.  HENT:     Head: Normocephalic and atraumatic.  Pulmonary:     Effort: Pulmonary effort is normal. No respiratory distress.  Skin:    General: Skin is warm and dry.  Neurological:     Mental Status: She is alert and oriented to person, place, and time.  Psychiatric:        Mood and Affect: Mood normal.        Behavior: Behavior normal.    Lab Results:  Recent Labs    06/16/22 0445 06/17/22 0630  WBC 15.3* 10.6*  HGB 12.3 10.2*  HCT 36.6 31.6*  PLT 286 202   BMET Recent Labs    06/16/22 0445 06/17/22 0630  NA 140 140  K 3.3* 3.8  CL 109 107  CO2 23 26  GLUCOSE 150* 100*  BUN 30* 19  CREATININE 0.51 0.54  CALCIUM 8.9 8.2*   Assessment & Plan: 81 year old female with PMH nephrolithiasis  and urosepsis currently admitted with urosepsis associated with a 4 mm distal left ureteral stone, s/p left ureteral stent placement with Dr. Bernardo Heater yesterday.  She is clinically improving today on empiric antibiotics and tolerating her stent well, though she is having some gross hematuria that is consistent with her ureteral stent.  We discussed the need for follow-up ureteroscopy with laser lithotripsy and stent exchange in 2 to 3 weeks after completion of culture appropriate antibiotics.  She expressed understanding of the plan.  Recommendations: -Discontinue Foley catheter once afebrile x24 hours -Continue empiric antibiotics and follow cultures for total of 14 days of culture appropriate therapy -Consider starting Flomax 0.4 mg daily and oxybutynin 5 mg every 8 hours as needed if she develops stent discomfort, though no evidence of this at this  time -Outpatient ureteroscopy with laser lithotripsy and stent exchange with Dr. Bernardo Heater in 2 to 3 weeks  Debroah Loop, PA-C 06/17/2022

## 2022-06-17 NOTE — Hospital Course (Addendum)
Taken from H&P.  Amanda Bruce is a 81 y.o. female with medical history significant for nephrolithiasis who presents to the ER for evaluation of low back pain mostly in the left lumbar area which she rates a 10 x 10 in intensity at its worst.  Pain is associated with nausea, vomiting, diarrhea, hematuria and chills.  She denies having any fever and is not on any anticoagulant therapy.   ED course.  Hemodynamically stable.  Labs pertinent for potassium of 3.3.  Leukocytosis at 15.3, hemoglobin 12.3. EKG with sinus rhythm and left axis deviation. Renal stone CT shows stable marked severity left-sided hydronephrosis and hydroureter, to the level of the distal left ureter, without visualization of an obstructing ureteral calculus. Numerous bilateral subcentimeter non-obstructing renal calculi. Large left renal cyst. This may represent a large simple cyst, however, given the additional findings stated above, an associated soft tissue component cannot be excluded. CT or MRI correlation is again recommended for improved characterization.  CT hematuria workup shows moderate left hydronephrosis and hydroureter with diffuse perinephric edema. There is a 4 mm stone identified at the left UVJ. Striated nephrographic appearance of the left kidney is identified on the delayed images which may reflect underlying pyelonephritis. There are two, nonenhancing hyperdense filling defects within the dilated left upper and lower pole collecting system. There is also increased density throughout the mid and distal left ureter without definite signs of enhancement. Imaging findings are favored to represent blood clot. Absence of contrast material within the left renal collecting system and ureter on the delayed images diminishes sensitivity for detecting underlying urothelial lesion. Follow-up imaging is recommended to ensure clearance of these hyperdense filling defects and to exclude underlying urothelial neoplasm.  Bilateral nephrolithiasis. Small hiatal hernia. 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 Aortic Atherosclerosis . Urology was consulted and she was taken to the OR for cystoscopy and stent placement. She was started on ceftriaxone.  12/21: Afebrile this morning, maximum recorded temperature of 100.7 within the past 24 hours.  CBC with improving leukocytosis at 10.6 but all cell lines decreased so may be some dilutional effect.  Po kalemia has been resolved with potassium of 3.8.  Preliminary cystoscopy cultures with no organisms.  COVID-19, influenza and RSV PCR negative.  Urine cultures with E. coli-pending susceptibility. Per urology's note they would like to keep the Foley catheter until urine clears and she does not have fever in the past 24-hour.  Still having gross hematuria.  12/22: Remained afebrile over the past 24 hours.  Labs with mild hypokalemia at 3.4 which is being repleted.  Leukocytosis has been resolved.  Urine cultures with E. coli which shows resistance to ampicillin, ciprofloxacin and intermediate to Augmentin. Recent urine culture with E. coli resistant to fluoroquinolones and ampicillin and intermediate to cefuroxime. Hematuria also improved.  Discussed with urology as patient wants to go home and they advised discharging on cefuroxime for 2 weeks and they will follow-up as an outpatient for definitive management of her renal stones after clearing of this infection in 2 to 3 weeks.  Foley catheter was removed.  She will continue with rest of her home medications and need to have a close follow-up with her providers for further recommendations.

## 2022-06-17 NOTE — Plan of Care (Signed)
  Problem: Clinical Measurements: Goal: Ability to maintain clinical measurements within normal limits will improve Outcome: Progressing   Problem: Clinical Measurements: Goal: Will remain free from infection Outcome: Progressing   Problem: Clinical Measurements: Goal: Diagnostic test results will improve Outcome: Progressing   

## 2022-06-17 NOTE — Progress Notes (Signed)
Progress Note   Patient: Amanda Bruce HUD:[id number] DOB: 01-21-1941 DOA: 06/16/2022     1 DOS: the patient was seen and examined on 06/17/2022   Brief hospital course: Taken from H&P.  PAULINE PEGUES is a 81 y.o. female with medical history significant for nephrolithiasis who presents to the ER for evaluation of low back pain mostly in the left lumbar area which she rates a 10 x 10 in intensity at its worst.  Pain is associated with nausea, vomiting, diarrhea, hematuria and chills.  She denies having any fever and is not on any anticoagulant therapy.   ED course.  Hemodynamically stable.  Labs pertinent for potassium of 3.3.  Leukocytosis at 15.3, hemoglobin 12.3. EKG with sinus rhythm and left axis deviation. Renal stone CT shows stable marked severity left-sided hydronephrosis and hydroureter, to the level of the distal left ureter, without visualization of an obstructing ureteral calculus. Numerous bilateral subcentimeter non-obstructing renal calculi. Large left renal cyst. This may represent a large simple cyst, however, given the additional findings stated above, an associated soft tissue component cannot be excluded. CT or MRI correlation is again recommended for improved characterization.  CT hematuria workup shows moderate left hydronephrosis and hydroureter with diffuse perinephric edema. There is a 4 mm stone identified at the left UVJ. Striated nephrographic appearance of the left kidney is identified on the delayed images which may reflect underlying pyelonephritis. There are two, nonenhancing hyperdense filling defects within the dilated left upper and lower pole collecting system. There is also increased density throughout the mid and distal left ureter without definite signs of enhancement. Imaging findings are favored to represent blood clot. Absence of contrast material within the left renal collecting system and ureter on the delayed images diminishes sensitivity for  detecting underlying urothelial lesion. Follow-up imaging is recommended to ensure clearance of these hyperdense filling defects and to exclude underlying urothelial neoplasm. Bilateral nephrolithiasis. Small hiatal hernia. 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 Aortic Atherosclerosis . Urology was consulted and she was taken to the OR for cystoscopy and stent placement. She was started on ceftriaxone.  12/21: Afebrile this morning, maximum recorded temperature of 100.7 within the past 24 hours.  CBC with improving leukocytosis at 10.6 but all cell lines decreased so may be some dilutional effect.  Po kalemia has been resolved with potassium of 3.8.  Preliminary cystoscopy cultures with no organisms.  COVID-19, influenza and RSV PCR negative.  Urine cultures with E. coli-pending susceptibility. Per urology's note they would like to keep the Foley catheter until urine clears and she does not have fever in the past 24-hour.  Still having gross hematuria.      Assessment and Plan: * Pyelonephritis of left kidney Secondary to nephrolithiasis with obstruction resulting in moderate left hydronephrosis and hydroureter with diffuse perinephric edema. Patient has left CVA tenderness with marked leukocytosis Urine cultures with 20,000 colonies of E. Coli Urology is on board Continue empiric antibiotic therapy with Rocephin IV fluid hydration    Hydronephrosis concurrent with and due to calculi of kidney and ureter S/p cystoscopy and ureteral stent placement by urology. Patient still having gross hematuria.  Urology would like to keep the Foley catheter until urine clears and she is no more febrile. Patient will need a repeat procedure with ureteroscopy in 2 to 3 weeks which will be arranged as outpatient.  Hypokalemia Resolved with repletion.  Magnesium within normal limit  Primary hypertension Blood pressure within goal. -Keep holding home amlodipine  Protein-calorie  malnutrition, severe Estimated body mass index is 18.88 kg/m as calculated from the following:   Height as of this encounter: 5' 7.01" (1.702 m).   Weight as of this encounter: 54.7 kg.   -Dietitian consult.   Subjective: Patient was seen and examined today.  Pain has been improved.  Continues to have gross hematuria.  Physical Exam: Vitals:   06/16/22 1959 06/17/22 0516 06/17/22 1151 06/17/22 1536  BP: 104/78 124/71 124/71 (!) 118/59  Pulse: 83 80 80 87  Resp: '18 18 18 18  '$ Temp: 98.8 F (37.1 C) 98.9 F (37.2 C) 98.9 F (37.2 C) 98.5 F (36.9 C)  TempSrc: Oral Oral Oral   SpO2: 98% 98% 98% 92%  Weight:      Height:       General.  Frail and malnourished elderly lady, in no acute distress. Pulmonary.  Lungs clear bilaterally, normal respiratory effort. CV.  Regular rate and rhythm, no JVD, rub or murmur. Abdomen.  Soft, nontender, nondistended, BS positive. CNS.  Alert and oriented .  No focal neurologic deficit. Extremities.  No edema, no cyanosis, pulses intact and symmetrical. Psychiatry.  Judgment and insight appears normal.   Data Reviewed: Prior data reviewed  Family Communication: Talked with son on phone.  Disposition: Status is: Inpatient Remains inpatient appropriate because: Severity of illness  Planned Discharge Destination: Home  Time spent: 50 minutes  This record has been created using Systems analyst. Errors have been sought and corrected,but may not always be located. Such creation errors do not reflect on the standard of care.   Author: Lorella Nimrod, MD 06/17/2022 4:59 PM  For on call review [email or web address].

## 2022-06-17 NOTE — Progress Notes (Signed)
Surgical Physician Order Form [facility] [place]  Dr. Bernardo Heater * Scheduling expectation :  2-3 weeks  *Length of Case:   *Clearance needed: no  *Anticoagulation Instructions: N/A  *Aspirin Instructions: Ok to continue Aspirin  *Post-op visit Date/Instructions:   TBD  *Diagnosis: Left Ureteral Stone  *Procedure: left  Ureteroscopy w/laser lithotripsy & stent exchange (17915)   Additional orders: N/A  -Admit type: OUTpatient  -Anesthesia: General  -VTE Prophylaxis Standing Order SCD's       Other:   -Standing Lab Orders Per Anesthesia    Lab other: None  -Standing Test orders EKG/Chest x-ray per Anesthesia       Test other:   - Medications:   TBD, urine culture pending  -Other orders:  N/A

## 2022-06-17 NOTE — Plan of Care (Signed)
  Problem: Clinical Measurements: Goal: Will remain free from infection Outcome: Progressing   Problem: Clinical Measurements: Goal: Cardiovascular complication will be avoided Outcome: Progressing   Problem: Activity: Goal: Risk for activity intolerance will decrease Outcome: Progressing   Problem: Nutrition: Goal: Adequate nutrition will be maintained Outcome: Progressing   Problem: Elimination: Goal: Will not experience complications related to bowel motility Outcome: Progressing   Problem: Elimination: Goal: Will not experience complications related to urinary retention Outcome: Progressing   Problem: Pain Managment: Goal: General experience of comfort will improve Outcome: Progressing   Problem: Safety: Goal: Ability to remain free from injury will improve Outcome: Progressing   Problem: Skin Integrity: Goal: Risk for impaired skin integrity will decrease Outcome: Progressing

## 2022-06-17 NOTE — Anesthesia Postprocedure Evaluation (Signed)
Anesthesia Post Note  Patient: Amanda Bruce  Procedure(s) Performed: CYSTOSCOPY/URETEROSCOPY/RETROGRADE PYELOGRAM/STENT PLACEMENT (Left)  Patient location during evaluation: PACU Anesthesia Type: General Level of consciousness: awake and alert Pain management: pain level controlled Vital Signs Assessment: post-procedure vital signs reviewed and stable Respiratory status: spontaneous breathing, nonlabored ventilation, respiratory function stable and patient connected to nasal cannula oxygen Cardiovascular status: blood pressure returned to baseline and stable Postop Assessment: no apparent nausea or vomiting Anesthetic complications: no   No notable events documented.   Last Vitals:  Vitals:   06/17/22 0516 06/17/22 1151  BP: 124/71 124/71  Pulse: 80 80  Resp: 18 18  Temp: 37.2 C 37.2 C  SpO2: 98% 98%    Last Pain:  Vitals:   06/17/22 1151  TempSrc: Oral  PainSc:                  [name]

## 2022-06-17 NOTE — Assessment & Plan Note (Signed)
Blood pressure within goal. -Keep holding home amlodipine

## 2022-06-17 NOTE — Assessment & Plan Note (Signed)
Estimated body mass index is 18.88 kg/m as calculated from the following:   Height as of this encounter: 5' 7.01" (1.702 m).   Weight as of this encounter: 54.7 kg.   -Dietitian consult.

## 2022-06-18 DIAGNOSIS — R310 Gross hematuria: Secondary | ICD-10-CM | POA: Diagnosis not present

## 2022-06-18 DIAGNOSIS — N132 Hydronephrosis with renal and ureteral calculous obstruction: Secondary | ICD-10-CM | POA: Diagnosis not present

## 2022-06-18 DIAGNOSIS — E43 Unspecified severe protein-calorie malnutrition: Secondary | ICD-10-CM | POA: Diagnosis not present

## 2022-06-18 DIAGNOSIS — N12 Tubulo-interstitial nephritis, not specified as acute or chronic: Secondary | ICD-10-CM | POA: Diagnosis not present

## 2022-06-18 LAB — BASIC METABOLIC PANEL
Anion gap: 7 (ref 5–15)
BUN: 13 mg/dL (ref 8–23)
CO2: 27 mmol/L (ref 22–32)
Calcium: 8.6 mg/dL — ABNORMAL LOW (ref 8.9–10.3)
Chloride: 105 mmol/L (ref 98–111)
Creatinine, Ser: 0.47 mg/dL (ref 0.44–1.00)
GFR, Estimated: >60 mL/min (ref >60)
Glucose, Bld: 98 mg/dL (ref 70–99)
Potassium: 3.4 mmol/L — ABNORMAL LOW (ref 3.5–5.1)
Sodium: 139 mmol/L (ref 135–145)

## 2022-06-18 LAB — CBC
HCT: 32.8 % — ABNORMAL LOW (ref 36.0–46.0)
Hemoglobin: 10.7 g/dL — ABNORMAL LOW (ref 12.0–15.0)
MCH: 31.1 pg (ref 26.0–34.0)
MCHC: 32.6 g/dL (ref 30.0–36.0)
MCV: 95.3 fL (ref 80.0–100.0)
Platelets: 214 10*3/uL (ref 150–400)
RBC: 3.44 MIL/uL — ABNORMAL LOW (ref 3.87–5.11)
RDW: 12.2 % (ref 11.5–15.5)
WBC: 7.3 10*3/uL (ref 4.0–10.5)
nRBC: 0 % (ref 0.0–0.2)

## 2022-06-18 LAB — URINE CULTURE: Culture: 20000 — AB

## 2022-06-18 MED ORDER — CEFUROXIME AXETIL 500 MG PO TABS: 500.0000 mg | ORAL_TABLET | Freq: Two times a day (BID) | ORAL | Status: DC

## 2022-06-18 MED ORDER — CEFUROXIME AXETIL 500 MG PO TABS: 500.0000 mg | ORAL_TABLET | Freq: Two times a day (BID) | ORAL | 0 refills | Status: AC

## 2022-06-18 MED ORDER — CHLORHEXIDINE GLUCONATE CLOTH 2 % EX PADS
6.0000 | MEDICATED_PAD | Freq: Every day | CUTANEOUS | Status: DC
  Administered 2022-06-18: 6 via TOPICAL

## 2022-06-18 MED ORDER — POTASSIUM CHLORIDE CRYS ER 20 MEQ PO TBCR
20.0000 meq | EXTENDED_RELEASE_TABLET | Freq: Once | ORAL | Status: AC
  Administered 2022-06-18: 20 meq via ORAL
  Filled 2022-06-18: qty 1

## 2022-06-18 NOTE — Discharge Summary (Signed)
Physician Discharge Summary   Patient: Amanda Bruce MRN: [id number] DOB: Dec 17, 1940  Admit date:     06/16/2022  Discharge date: 06/18/22  Discharge Physician: Lorella Nimrod   PCP: Pcp, No   Recommendations at discharge:  Please obtain CBC and BMP in 1 week Please ensure the completion of 2 weeks of antibiotic Follow-up with urology in 2 to 3 weeks Follow-up with primary care provider within a week  Discharge Diagnoses: Principal Problem:   Pyelonephritis of left kidney Active Problems:   Hydronephrosis concurrent with and due to calculi of kidney and ureter   Hypokalemia   Primary hypertension   Protein-calorie malnutrition, severe   Gross hematuria   Hospital Course: Taken from H&P.  Amanda Bruce is a 81 y.o. female with medical history significant for nephrolithiasis who presents to the ER for evaluation of low back pain mostly in the left lumbar area which she rates a 10 x 10 in intensity at its worst.  Pain is associated with nausea, vomiting, diarrhea, hematuria and chills.  She denies having any fever and is not on any anticoagulant therapy.   ED course.  Hemodynamically stable.  Labs pertinent for potassium of 3.3.  Leukocytosis at 15.3, hemoglobin 12.3. EKG with sinus rhythm and left axis deviation. Renal stone CT shows stable marked severity left-sided hydronephrosis and hydroureter, to the level of the distal left ureter, without visualization of an obstructing ureteral calculus. Numerous bilateral subcentimeter non-obstructing renal calculi. Large left renal cyst. This may represent a large simple cyst, however, given the additional findings stated above, an associated soft tissue component cannot be excluded. CT or MRI correlation is again recommended for improved characterization.  CT hematuria workup shows moderate left hydronephrosis and hydroureter with diffuse perinephric edema. There is a 4 mm stone identified at the left UVJ. Striated nephrographic  appearance of the left kidney is identified on the delayed images which may reflect underlying pyelonephritis. There are two, nonenhancing hyperdense filling defects within the dilated left upper and lower pole collecting system. There is also increased density throughout the mid and distal left ureter without definite signs of enhancement. Imaging findings are favored to represent blood clot. Absence of contrast material within the left renal collecting system and ureter on the delayed images diminishes sensitivity for detecting underlying urothelial lesion. Follow-up imaging is recommended to ensure clearance of these hyperdense filling defects and to exclude underlying urothelial neoplasm. Bilateral nephrolithiasis. Small hiatal hernia. 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 Aortic Atherosclerosis . Urology was consulted and she was taken to the OR for cystoscopy and stent placement. She was started on ceftriaxone.  12/21: Afebrile this morning, maximum recorded temperature of 100.7 within the past 24 hours.  CBC with improving leukocytosis at 10.6 but all cell lines decreased so may be some dilutional effect.  Po kalemia has been resolved with potassium of 3.8.  Preliminary cystoscopy cultures with no organisms.  COVID-19, influenza and RSV PCR negative.  Urine cultures with E. coli-pending susceptibility. Per urology's note they would like to keep the Foley catheter until urine clears and she does not have fever in the past 24-hour.  Still having gross hematuria.  12/22: Remained afebrile over the past 24 hours.  Labs with mild hypokalemia at 3.4 which is being repleted.  Leukocytosis has been resolved.  Urine cultures with E. coli which shows resistance to ampicillin, ciprofloxacin and intermediate to Augmentin. Recent urine culture with E. coli resistant to fluoroquinolones and ampicillin and intermediate to cefuroxime. Hematuria also  improved.  Discussed with urology as patient  wants to go home and they advised discharging on cefuroxime for 2 weeks and they will follow-up as an outpatient for definitive management of her renal stones after clearing of this infection in 2 to 3 weeks.  Foley catheter was removed.  She will continue with rest of her home medications and need to have a close follow-up with her providers for further recommendations.  Assessment and Plan: * Pyelonephritis of left kidney Secondary to nephrolithiasis with obstruction resulting in moderate left hydronephrosis and hydroureter with diffuse perinephric edema. Patient has left CVA tenderness with marked leukocytosis Urine cultures with 20,000 colonies of E. Coli Urology is on board Continue empiric antibiotic therapy with Rocephin IV fluid hydration    Hydronephrosis concurrent with and due to calculi of kidney and ureter S/p cystoscopy and ureteral stent placement by urology. Patient still having gross hematuria.  Urology would like to keep the Foley catheter until urine clears and she is no more febrile. Patient will need a repeat procedure with ureteroscopy in 2 to 3 weeks which will be arranged as outpatient.  Hypokalemia Resolved with repletion.  Magnesium within normal limit  Primary hypertension Blood pressure within goal. -Keep holding home amlodipine  Protein-calorie malnutrition, severe Estimated body mass index is 18.88 kg/m as calculated from the following:   Height as of this encounter: 5' 7.01" (1.702 m).   Weight as of this encounter: 54.7 kg.   -Dietitian consult.   Consultants: Urology Procedures performed: Cystoscopy and ureteral stent placement Disposition: Home Diet recommendation:  Discharge Diet Orders (From admission, onward)     Start     Ordered   06/18/22 0000  Diet - low sodium heart healthy        06/18/22 1453           Regular diet DISCHARGE MEDICATION: Allergies as of 06/18/2022   No Known Allergies      Medication List      TAKE these medications    acetaminophen 325 MG tablet Commonly known as: TYLENOL Take 325-650 mg by mouth every 6 (six) hours as needed (headaches.).   amLODipine 5 MG tablet Commonly known as: NORVASC Take 1 tablet (5 mg total) by mouth daily.   ascorbic acid 500 MG tablet Commonly known as: VITAMIN C Take 500 mg by mouth daily.   aspirin EC 81 MG tablet Take 81 mg by mouth daily.   Biotin 5000 MCG Tabs Take 5,000 mcg by mouth daily.   cefUROXime 500 MG tablet Commonly known as: CEFTIN Take 1 tablet (500 mg total) by mouth 2 (two) times daily with a meal for 14 days. Start taking on: June 19, 2022   cholecalciferol 25 MCG (1000 UNIT) tablet Commonly known as: VITAMIN D3 Take 1,000 Units by mouth daily.   COLLAGEN PO Take 1,000 mg by mouth daily.   nystatin cream Commonly known as: MYCOSTATIN SMARTSIG:1 sparingly Topical Twice Daily   tretinoin 0.05 % cream Commonly known as: RETIN-A Apply topically at bedtime. What changed:  how much to take when to take this   Vitamin A 2400 MCG (8000 UT) Caps Take 2,400 mcg by mouth daily.   Vitamin E 670 MG (1000 UT) Caps Take 670 mg by mouth daily.        Discharge Exam: Filed Weights   06/16/22 0439  Weight: 54.7 kg   General.  Frail elderly lady, in no acute distress. Pulmonary.  Lungs clear bilaterally, normal respiratory effort. CV.  Regular rate  and rhythm, no JVD, rub or murmur. Abdomen.  Soft, nontender, nondistended, BS positive. CNS.  Alert and oriented .  No focal neurologic deficit. Extremities.  No edema, no cyanosis, pulses intact and symmetrical. Psychiatry.  Judgment and insight appears normal.   Condition at discharge: stable  The results of significant diagnostics from this hospitalization (including imaging, microbiology, ancillary and laboratory) are listed below for reference.   Imaging Studies: DG OR UROLOGY CYSTO IMAGE ([facility] ONLY)  Result Date: 06/16/2022 There is no  interpretation for this exam.  This order is for images obtained during a surgical procedure.  Please See "Surgeries" Tab for more information regarding the procedure.   CT HEMATURIA WORKUP  Result Date: 06/16/2022 CLINICAL DATA:  Hematuria. EXAM: CT ABDOMEN AND PELVIS WITHOUT AND WITH CONTRAST TECHNIQUE: Multidetector CT imaging of the abdomen and pelvis was performed following the standard protocol before and following the bolus administration of intravenous contrast. RADIATION DOSE REDUCTION: This exam was performed according to the departmental dose-optimization program which includes automated exposure control, adjustment of the mA and/or kV according to patient size and/or use of iterative reconstruction technique. CONTRAST:  152m OMNIPAQUE IOHEXOL 300 MG/ML  SOLN COMPARISON:  06/16/2022, earlier today. FINDINGS: Lower chest: No pleural fluid or airspace disease. Hepatobiliary: No suspicious liver lesion. Gallbladder appears normal. Fusiform scratch set mild fusiform increase caliber of the CBD measures up to 7 mm. Intrahepatic biliary prominence is noted. Pancreas: Unremarkable. No pancreatic ductal dilatation or surrounding inflammatory changes. Spleen: Normal in size without focal abnormality. Adrenals/Urinary Tract: Normal adrenal glands. Inferior pole right renal calculi are identified. Cluster of stones within the inferior pole measures up to 5 mm, image 38/5. No right-sided hydronephrosis or mass. Multiple left renal calcifications are identified. The largest is in the upper pole measuring 4 mm. Upper pole left kidney cyst is identified measuring 4.5 cm. Moderate left hydronephrosis and hydroureter with diffuse perinephric edema. Striated nephrographic appearance of the left renal cortex with delayed nephrogram noted. There is a stone(s) identified at the left UVJ which measures 4 mm, image 73/2. There is a hyperdense defect within the upper pole collecting system of the left kidney which measures  3.1 cm, image 28/10 and does not appear to enhance on the postcontrast images suggestive of underlying blood clot. Similarly, hyperdense filling defect within a posterior lower pole calyx is identified which also does not appear to enhance, image 28/2. There is increased density throughout the mid and distal dilated left ureter without definite signs of enhancement, image 54/2 through image 71/2. No suspicious filling defect identified within the urinary bladder. Stomach/Bowel: Small hiatal hernia. Stomach appears normal. No dilated loops of large or small bowel. No bowel wall thickening or inflammation. Vascular/Lymphatic: Aortic atherosclerosis without aneurysm. No signs of abdominopelvic adenopathy. Reproductive: Uterus and bilateral adnexa are unremarkable. Other: No free fluid or fluid collections. Musculoskeletal: Severe degenerative changes are identified involving the right hip. No acute or suspicious osseous findings. Lumbar degenerative disc disease and scoliosis. IMPRESSION: 1. Moderate left hydronephrosis and hydroureter with diffuse perinephric edema. There is a 4 mm stone identified at the left UVJ. 2. Striated nephrographic appearance of the left kidney is identified on the delayed images which may reflect underlying pyelonephritis. 3. There are two, nonenhancing hyperdense filling defects within the dilated left upper and lower pole collecting system. There is also increased density throughout the mid and distal left ureter without definite signs of enhancement. Imaging findings are favored to represent blood clot. Absence of contrast material within the  left renal collecting system and ureter on the delayed images diminishes sensitivity for detecting underlying urothelial lesion. Follow-up imaging is recommended to ensure clearance of these hyperdense filling defects and to exclude underlying urothelial neoplasm. 4. Bilateral nephrolithiasis. 5. Small hiatal hernia. 6. Severe degenerative changes  involving the right hip. 7. Aortic Atherosclerosis (ICD10-I70.0). Electronically Signed   By: Kerby Moors M.D.   On: 06/16/2022 08:13   CT Renal Stone Study  Result Date: 06/16/2022 CLINICAL DATA:  Back pain and hematuria. EXAM: CT ABDOMEN AND PELVIS WITHOUT CONTRAST TECHNIQUE: Multidetector CT imaging of the abdomen and pelvis was performed following the standard protocol without IV contrast. RADIATION DOSE REDUCTION: This exam was performed according to the departmental dose-optimization program which includes automated exposure control, adjustment of the mA and/or kV according to patient size and/or use of iterative reconstruction technique. COMPARISON:  July 07, 2020 FINDINGS: Lower chest: No acute abnormality. Hepatobiliary: No focal liver abnormality is seen. No gallstones, gallbladder wall thickening, or biliary dilatation. Pancreas: Unremarkable. No pancreatic ductal dilatation or surrounding inflammatory changes. Spleen: Normal in size without focal abnormality. Adrenals/Urinary Tract: Adrenal glands are unremarkable. A 5.0 cm x 3.8 cm cyst is seen within the upper pole of the left kidney. This is mildly increased in size when compared to the prior study. Numerous bilateral subcentimeter non-obstructing renal calculi are present. Stable marked severity left-sided hydronephrosis and hydroureter are seen, to the level of the distal left ureter. Stable left-sided perinephric inflammatory fat stranding is also seen. A 14 mm x 12 mm x 28 mm area of increased attenuation (approximately 68.19 Hounsfield units) is seen within an upper pole calyx of the left kidney (axial CT images 24 through 30, CT series 2/coronal reformatted image 47/CT series 5). This extends to the level of the left renal pelvis and represents a new finding when compared to the prior study. A similar appearing area of increased attenuation (approximately 75.00 Hounsfield units) is seen throughout a large portion of the left ureter.  An obstructing renal calculus is not clearly identified. Bladder is unremarkable. Stomach/Bowel: There is a small hiatal hernia. Appendix appears normal. Stool is seen throughout the large bowel. No evidence of bowel wall thickening, distention, or inflammatory changes. Vascular/Lymphatic: Aortic atherosclerosis. No enlarged abdominal or pelvic lymph nodes. Reproductive: Uterus and bilateral adnexa are unremarkable. Other: No abdominal wall hernia or abnormality. No abdominopelvic ascites. Musculoskeletal: Multilevel degenerative changes seen throughout the lumbar spine. IMPRESSION: 1. Stable marked severity left-sided hydronephrosis and hydroureter, to the level of the distal left ureter, without visualization of an obstructing ureteral calculus. 2. Findings which may represent acute blood within the left ureter and upper pole calyx of the left kidney. A hemorrhagic lesion within the upper pole of the left kidney cannot be excluded. Further evaluation with CT or MRI (without and with IV contrast) and urology consult is recommended. 3. Numerous bilateral subcentimeter non-obstructing renal calculi. 4. Large left renal cyst. This may represent a large simple cyst, however, given the additional findings stated above, an associated soft tissue component cannot be excluded. CT or MRI correlation is again recommended for improved characterization. 5. Small hiatal hernia. 6. Aortic atherosclerosis. Aortic Atherosclerosis (ICD10-I70.0). Electronically Signed   By: Virgina Norfolk M.D.   On: 06/16/2022 05:58    Microbiology: Results for orders placed or performed during the hospital encounter of 06/16/22  Resp panel by RT-PCR (RSV, Flu A&B, Covid) Anterior Nasal Swab     Status: None   Collection Time: 06/16/22  4:45 AM  Specimen: Anterior Nasal Swab  Result Value Ref Range Status   SARS Coronavirus 2 by RT PCR NEGATIVE NEGATIVE Final    Comment: (NOTE) SARS-CoV-2 target nucleic acids are NOT DETECTED.  The  SARS-CoV-2 RNA is generally detectable in upper respiratory specimens during the acute phase of infection. The lowest concentration of SARS-CoV-2 viral copies this assay can detect is 138 copies/mL. A negative result does not preclude SARS-Cov-2 infection and should not be used as the sole basis for treatment or other patient management decisions. A negative result may occur with  improper specimen collection/handling, submission of specimen other than nasopharyngeal swab, presence of viral mutation(s) within the areas targeted by this assay, and inadequate number of viral copies(<138 copies/mL). A negative result must be combined with clinical observations, patient history, and epidemiological information. The expected result is Negative.  Fact Sheet for Patients:  EntrepreneurPulse.com.au  Fact Sheet for Healthcare Providers:  IncredibleEmployment.be  This test is no t yet approved or cleared by the [place] FDA and  has been authorized for detection and/or diagnosis of SARS-CoV-2 by FDA under an Emergency Use Authorization (EUA). This EUA will remain  in effect (meaning this test can be used) for the duration of the COVID-19 declaration under Section 564(b)(1) of the Act, 21 U.S.C.section 360bbb-3(b)(1), unless the authorization is terminated  or revoked sooner.       Influenza A by PCR NEGATIVE NEGATIVE Final   Influenza B by PCR NEGATIVE NEGATIVE Final    Comment: (NOTE) The Xpert Xpress SARS-CoV-2/FLU/RSV plus assay is intended as an aid in the diagnosis of influenza from Nasopharyngeal swab specimens and should not be used as a sole basis for treatment. Nasal washings and aspirates are unacceptable for Xpert Xpress SARS-CoV-2/FLU/RSV testing.  Fact Sheet for Patients: EntrepreneurPulse.com.au  Fact Sheet for Healthcare Providers: IncredibleEmployment.be  This test is not yet approved or  cleared by the [place] FDA and has been authorized for detection and/or diagnosis of SARS-CoV-2 by FDA under an Emergency Use Authorization (EUA). This EUA will remain in effect (meaning this test can be used) for the duration of the COVID-19 declaration under Section 564(b)(1) of the Act, 21 U.S.C. section 360bbb-3(b)(1), unless the authorization is terminated or revoked.     Resp Syncytial Virus by PCR NEGATIVE NEGATIVE Final    Comment: (NOTE) Fact Sheet for Patients: EntrepreneurPulse.com.au  Fact Sheet for Healthcare Providers: IncredibleEmployment.be  This test is not yet approved or cleared by the [place] FDA and has been authorized for detection and/or diagnosis of SARS-CoV-2 by FDA under an Emergency Use Authorization (EUA). This EUA will remain in effect (meaning this test can be used) for the duration of the COVID-19 declaration under Section 564(b)(1) of the Act, 21 U.S.C. section 360bbb-3(b)(1), unless the authorization is terminated or revoked.  Performed at [facility], [address], [place], [place] [address]   Urine Culture     Status: Abnormal   Collection Time: 06/16/22  4:45 AM   Specimen: Urine, Clean Catch  Result Value Ref Range Status   Specimen Description   Final    URINE, CLEAN CATCH Performed at [facility], [address], [place], [place] [address]    Special Requests   Final    NONE Performed at [facility], [place], [place] [address]    Culture 20,000 COLONIES/mL ESCHERICHIA COLI (A)  Final   Report Status 06/18/2022 FINAL  Final   Organism ID, Bacteria ESCHERICHIA COLI (A)  Final  Susceptibility   Escherichia coli - MIC*    AMPICILLIN >=32 RESISTANT Resistant     CEFAZOLIN <=4 SENSITIVE Sensitive     CEFEPIME <=0.12 SENSITIVE Sensitive     CEFTRIAXONE <=0.25 SENSITIVE Sensitive     CIPROFLOXACIN >=4 RESISTANT Resistant     GENTAMICIN  <=1 SENSITIVE Sensitive     IMIPENEM <=0.25 SENSITIVE Sensitive     NITROFURANTOIN <=16 SENSITIVE Sensitive     TRIMETH/SULFA <=20 SENSITIVE Sensitive     AMPICILLIN/SULBACTAM 16 INTERMEDIATE Intermediate     PIP/TAZO <=4 SENSITIVE Sensitive     * 20,000 COLONIES/mL ESCHERICHIA COLI  Body fluid culture w Gram Stain     Status: None (Preliminary result)   Collection Time: 06/16/22  1:32 PM   Specimen: PATH Other; Body Fluid  Result Value Ref Range Status   Specimen Description   Final    CYSTOSCOPY Performed at [facility], [address], [place], [place] [address]    Special Requests   Final    URINE FROM CYSTOSCOPY Performed at [facility], [address] [place], [place] [address]    Gram Stain   Final    MODERATE WBC PRESENT,BOTH PMN AND MONONUCLEAR NO ORGANISMS SEEN    Culture   Final    RARE GRAM NEGATIVE RODS IDENTIFICATION AND SUSCEPTIBILITIES TO FOLLOW CRITICAL RESULT CALLED TO, READ BACK BY AND VERIFIED WITH: RN Stephan Minister [phone] AT 1048 BY EC Performed at [facility], [place] [address], [place],  [address]    Report Status PENDING  Incomplete    Labs: CBC: Recent Labs  Lab 06/16/22 0445 06/17/22 0630 06/18/22 0520  WBC 15.3* 10.6* 7.3  NEUTROABS 13.4*  --   --   HGB 12.3 10.2* 10.7*  HCT 36.6 31.6* 32.8*  MCV 95.3 96.6 95.3  PLT 286 202 735   Basic Metabolic Panel: Recent Labs  Lab 06/16/22 0445 06/17/22 0630 06/18/22 0520  NA 140 140 139  K 3.3* 3.8 3.4*  CL 109 107 105  CO2 '23 26 27  '$ GLUCOSE 150* 100* 98  BUN 30* 19 13  CREATININE 0.51 0.54 0.47  CALCIUM 8.9 8.2* 8.6*  MG 2.2  --   --    Liver Function Tests: Recent Labs  Lab 06/16/22 0445  AST 27  ALT 13  ALKPHOS 110  BILITOT 1.0  PROT 7.5  ALBUMIN 4.0   CBG: No results for input(s): "GLUCAP" in the last 168 hours.  Discharge time spent: greater than 30 minutes.  This record has been created using Systems analyst. Errors  have been sought and corrected,but may not always be located. Such creation errors do not reflect on the standard of care.   Signed: Lorella Nimrod, MD [facility] 06/18/2022

## 2022-06-18 NOTE — Care Management Important Message (Signed)
Important Message  Patient Details  Name: Amanda Bruce MRN: [id number] Date of Birth: 1941-01-27   Medicare Important Message Given:  Yes     Juliann Pulse A Veroncia Jezek 06/18/2022, 2:36 PM

## 2022-06-18 NOTE — Progress Notes (Signed)
Initial Nutrition Assessment  DOCUMENTATION CODES:   Non-severe (moderate) malnutrition in context of social or environmental circumstances  INTERVENTION:   -Ensure Enlive po BID, each supplement provides 350 kcal and 20 grams of protein -MVI with minerals daily  NUTRITION DIAGNOSIS:   Moderate Malnutrition related to social / environmental circumstances as evidenced by mild fat depletion, moderate fat depletion, moderate muscle depletion, mild muscle depletion.  GOAL:   Patient will meet greater than or equal to 90% of their needs  MONITOR:   PO intake, Supplement acceptance  REASON FOR ASSESSMENT:   Consult Assessment of nutrition requirement/status  ASSESSMENT:   Pt with medical history significant for nephrolithiasis who presents for evaluation of low back pain mostly in the left lumbar area which she rates a 10 x 10 in intensity at its worst.  Pt admitted with pyelonephritis of lt kidney.   12/20- s/p lt uretal stent placement  Reviewed I/O's: -1.1 L x 24 hours and +348 ml since admission  UOP: 3.3 L x 24 hours   Spoke with pt at bedside, who reports feeling better today. She shares she has a fair appetite and consumed about 50% of her breakfast. Pt shares that she is "not a big eater" and usually consumes 1 meal per day, which consists of either canned soup or a TV dinner. Pt also snacks on cake, cookies, and soft drinks throughout the day. Noted meal completions 60-100%.   Pt denies any weight loss. Per shares her UBW is around 115-120#. Pt shares that she tries to watch what she eats, as she is a lover of fashion and has tried to maintain a certain dress size since her husband passed away 10 years ago. Reviewed wt hx; wt has been stable over the past 6 months.   Pt son called during visit and provided update per her request. Pt son confirmed above history and reports concern over minimal intake. Discussed importance of good meal and supplement intake to promote  healing. Pt amenable to supplements.   Medications reviewed and include vitamin C.   Labs reviewed: K: 3.4.  NUTRITION - FOCUSED PHYSICAL EXAM:  Flowsheet Row Most Recent Value  Orbital Region Moderate depletion  Upper Arm Region Moderate depletion  Thoracic and Lumbar Region Mild depletion  Buccal Region Mild depletion  Temple Region Mild depletion  Clavicle Bone Region Severe depletion  Clavicle and Acromion Bone Region Severe depletion  Scapular Bone Region Severe depletion  Dorsal Hand Moderate depletion  Patellar Region Moderate depletion  Anterior Thigh Region Moderate depletion  Posterior Calf Region Moderate depletion  Edema (RD Assessment) None  Hair Reviewed  Eyes Reviewed  Mouth Reviewed  Skin Reviewed  Nails Reviewed       Diet Order:   Diet Order             Diet - low sodium heart healthy           Diet regular Room service appropriate? Yes; Fluid consistency: Thin  Diet effective now                   EDUCATION NEEDS:   Education needs have been addressed  Skin:  Skin Assessment: Skin Integrity Issues: Skin Integrity Issues:: Incisions Incisions: closed vagina  Last BM:  06/15/22  Height:   Ht Readings from Last 1 Encounters:  06/16/22 5' 7.01" (1.702 m)    Weight:   Wt Readings from Last 1 Encounters:  06/16/22 54.7 kg    Ideal Body Weight:  61.4  kg  BMI:  Body mass index is 18.88 kg/m.  Estimated Nutritional Needs:   Kcal:  1650-1850  Protein:  85-100 grams  Fluid:  > 1.6 L    Loistine Chance, RD, LDN, [place] Registered Dietitian II Certified Diabetes Care and Education Specialist Please refer to [facility] for RD and/or RD on-call/weekend/after hours pager

## 2022-06-18 NOTE — Progress Notes (Signed)
Pt discharged home in stable condition. Pt has voided since discontinuation of foley catheter. Discharge instructions given to pt and son Ron. No immediate questions or concerns at this time. Pt discharged from unit via wheelchair. Scripts sent to pharmacy of choice. Discharged from unit via wheelchair

## 2022-06-18 NOTE — Progress Notes (Signed)
   06/18/22 1153  Provider Notification  Provider Name/Title Dr. Lorella Nimrod  Date Provider Notified 06/18/22  Time Provider Notified 1050  Method of Notification Page  Notification Reason Critical Result (Blood cx+ Gram neg rods)  Test performed and critical result Blood Cx  +  Date Critical Result Received 06/18/22  Time Critical Result Received 1048  Provider response Other (Comment) (MD already aware)  Date of Provider Response 06/18/22  Time of Provider Response 1115

## 2022-06-18 NOTE — Plan of Care (Signed)

## 2022-06-18 NOTE — Progress Notes (Signed)
Mobility Specialist - Progress Note   06/18/22 1449  Mobility  Activity Ambulated independently in hallway  Level of Assistance Modified independent, requires aide device or extra time  Assistive Device None  Distance Ambulated (ft) 320 ft  Activity Response Tolerated well  Mobility Referral Yes  $Mobility charge 1 Mobility   Pt supine upon entry, utilizing RA. Pt completed bed mob to EOB and STS ModI (extra time for each step). Pt ambulated two laps around NS without AD (pushed IV pole), tolerated well. During amb MS noticed a limp with Pt favoring her left leg, when asked about the limp Pt stated "its been that way for some time" and that her right leg is the reason for the limp. Pt returned to room, left amb around the room with needs within reach.   [place]ie [place] Mobility Specialist 06/18/22 3:02 PM

## 2022-06-19 LAB — BODY FLUID CULTURE W GRAM STAIN

## 2022-06-20 NOTE — Op Note (Signed)
Preoperative diagnosis:  Obstructing left distal ureteral calculus with infection  Postoperative diagnosis:  Same  Procedure:  Cystoscopy Left ureteral stent placement (41F/24 cm)  Left retrograde pyelography with interpretation   Surgeon: Nicki Reaper C. Charlet Harr, M.D.  Anesthesia: General  Complications: None  Intraoperative findings:  Persistent left nephrogram and left hydronephrosis seen on fluoroscopy secondary to prior IV contrast Urine aspirated left renal pelvis consistent with old blood; almost black in appearance Bladder mucosa without solid or papillary lesions.  Scattered mucosal erythema  EBL: Minimal  Specimens: Urine left renal pelvis for culture  Indication: Amanda Bruce is a 81 y.o. female presented to ED with left flank pain.  CT remarkable for left hydronephrosis with perinephric stranding.  She had leukocytosis and prior history of sepsis from an obstructing stone.  After reviewing the management options for treatment, he elected to proceed with the above surgical procedure(s). We have discussed the potential benefits and risks of the procedure, side effects of the proposed treatment, the likelihood of the patient achieving the goals of the procedure, and any potential problems that might occur during the procedure or recuperation. Informed consent has been obtained.  Description of procedure:  The patient was taken to the operating room and general anesthesia was induced.  The patient was placed in the dorsal lithotomy position, prepped and draped in the usual sterile fashion, and preoperative antibiotics were administered. A preoperative time-out was performed.   A 21 French cystoscope with obturator was lubricated and passed per urethra.  Panendoscopy was performed with findings as described above.  A 0. 038 Sensor guidewire was then advanced up the left ureter into the renal pelvis under fluoroscopic guidance.  A 6 French open-ended ureteral catheter was  advanced over the wire to the region of the renal pelvis.  The guidewire was removed and urine was aspirated as described above.  The urine was sent for culture.  Contrast was then instilled to the catheter to better opacify the renal pelvis.  The wire was replaced and the ureteral catheter was removed.    A 41F/24 cm Contour ureteral stent was then advanced over the guidewire.  The proximal curl was positioned in the renal pelvis under fluoroscopy.  The distal curl was well-positioned in the bladder.  A 16 French Foley catheter was placed and the balloon inflated with 10 mL of sterile water.  After anesthetic reversal she was transported to the PACU in stable condition.   Recommendation: Foley catheter drainage 24-48 hours Continue IV antibiotics   [name], MD

## 2022-06-22 ENCOUNTER — Telehealth: Payer: Self-pay | Admitting: *Deleted

## 2022-06-22 NOTE — Patient Outreach (Signed)
  Care Coordination [facility] Note Transition Care Management Unsuccessful Follow-up Telephone Call  Date of discharge and from where:  [facility] [id number]  Attempts:  1st Attempt  Reason for unsuccessful TCM follow-up call:  Left voice message  [place] Care Management [phone]

## 2022-06-23 ENCOUNTER — Telehealth: Payer: Self-pay

## 2022-06-23 NOTE — Telephone Encounter (Signed)
I spoke with Amanda Bruce. We have discussed possible surgery dates and Tuesday January 9th, 2024 was agreed upon by all parties. Patient given information about surgery date, what to expect pre-operatively and post operatively.  We discussed that a Pre-Admission Testing office will be calling to set up the pre-op visit that will take place prior to surgery, and that these appointments are typically done over the phone with a Pre-Admissions RN. Informed patient that our office will communicate any additional care to be provided after surgery. Patients questions or concerns were discussed during our call. Advised to call our office should there be any additional information, questions or concerns that arise. Patient verbalized understanding.

## 2022-06-23 NOTE — Patient Outreach (Signed)
  Care Coordination [facility] Note Transition Care Management Unsuccessful Follow-up Telephone Call  Date of discharge and from where:  [facility] 06/18/22  Attempts:  2nd Attempt  Reason for unsuccessful TCM follow-up call:  No answer/busy  Johnney Killian, RN, BSN, CCM Care Management Coordinator [facility] Phone: [phone]: [phone]

## 2022-06-23 NOTE — Progress Notes (Signed)
   [facility] From  Surgery Date: Date: 07/06/2022  Surgeon: Dr. [name], MD  Inpt ( No  )   Outpt (Yes)   Obs ( No  )   Diagnosis: N20.1 Left Ureteral Stone   -CPT: [phone]  Surgery: Left Ureteroscopy with laser lithotripsy and stent exchange  Stop Anticoagulations: No, may continue ASA  Cardiac/Medical/Pulmonary Clearance needed: no  *Orders entered into EPIC  Date: 06/23/22   *Case booked in [place]  Date: 06/23/22  *Notified pt of Surgery: Date: 06/23/22  PRE-OP UA & CX: no  *Placed into Prior Authorization Work [place] Date: 06/23/22  Assistant/laser/rep:No

## 2022-06-24 ENCOUNTER — Telehealth: Payer: Self-pay

## 2022-06-24 NOTE — Patient Outreach (Signed)
  Care Coordination [facility] Note Transition Care Management Unsuccessful Follow-up Telephone Call  Date of discharge and from where:  [facility] 06/18/22  Attempts:  3rd Attempt  Reason for unsuccessful TCM follow-up call:  No answer/busy  Johnney Killian, RN, BSN, CCM Care Management Coordinator [facility] Phone: [phone]: [phone]

## 2022-06-30 ENCOUNTER — Encounter
Admission: RE | Admit: 2022-06-30 | Discharge: 2022-06-30 | Disposition: A | Discharge status: Home / Self Care | Payer: Medicare Other | Source: Ambulatory Visit | Attending: Urology | Admitting: Urology

## 2022-06-30 ENCOUNTER — Telehealth: Payer: Self-pay

## 2022-06-30 HISTORY — DX: Essential (primary) hypertension: I10

## 2022-06-30 HISTORY — DX: Other amnesia: R41.3

## 2022-06-30 HISTORY — DX: Chronic diastolic (congestive) heart failure: I50.32

## 2022-06-30 HISTORY — DX: Bilateral primary osteoarthritis of knee: M17.0

## 2022-06-30 HISTORY — DX: Pure hypercholesterolemia, unspecified: E78.00

## 2022-06-30 MED ORDER — CEFUROXIME AXETIL 500 MG PO TABS: 500.0000 mg | ORAL_TABLET | Freq: Every day | ORAL | 0 refills | Status: DC

## 2022-06-30 NOTE — Telephone Encounter (Signed)
Patient son (per DPR) called in today and would like to know if patient should remain on antibiotics prior to surgery to decrease bacterial load. Also was concerned about blood being present in the urine still. Advised that blood in urine with ureteral stent present is common and expected. Went over many details of surgery with him as well. Spoke with Dr. Bernardo Heater whom agreed to keep patient on antibiotics until surgery. I have sent in Ceftin '500mg'$  once a day to cover her until surgery to [facility] per request. Patient advised.

## 2022-06-30 NOTE — Patient Instructions (Addendum)
Your procedure is scheduled on: Tuesday, January 9 Report to the Registration Desk on the 1st floor of the [facility]. To find out your arrival time, please call [phone] between 1PM - 3PM on: Monday, January 8 If your arrival time is 6:00 am, do not arrive prior to that time as the [facility] entrance doors do not open until 6:00 am.  REMEMBER: Instructions that are not followed completely may result in serious medical risk, up to and including death; or upon the discretion of your surgeon and anesthesiologist your surgery may need to be rescheduled.  Do not eat or drink after midnight the night before surgery.  No gum chewing, lozengers or hard candies.  TAKE THESE MEDICATIONS THE MORNING OF SURGERY WITH A SIP OF WATER:  Amlodipine  One week prior to surgery: starting today, January 3 Stop aspirin and Anti-inflammatories (NSAIDS) such as Advil, Aleve, Ibuprofen, Motrin, Naproxen, Naprosyn and Aspirin based products such as Excedrin, Goodys Powder, BC Powder. Stop ANY OVER THE COUNTER supplements until after surgery. Stop all vitamins You may however, continue to take Tylenol if needed for pain up until the day of surgery.  No Alcohol for 24 hours before or after surgery.  No Smoking including e-cigarettes for 24 hours prior to surgery.  No chewable tobacco products for at least 6 hours prior to surgery.  No nicotine patches on the day of surgery.  Do not use any "recreational" drugs for at least a week prior to your surgery.  Please be advised that the combination of cocaine and anesthesia may have negative outcomes, up to and including death. If you test positive for cocaine, your surgery will be cancelled.  On the morning of surgery brush your teeth with toothpaste and water, you may rinse your mouth with mouthwash if you wish. Do not swallow any toothpaste or mouthwash.  Do not wear jewelry, make-up, hairpins, clips or nail polish.  Do not wear lotions, powders, or  perfumes.   Do not shave body from the neck down 48 hours prior to surgery just in case you cut yourself which could leave a site for infection.   Contact lenses, hearing aids and dentures may not be worn into surgery.  Do not bring valuables to the hospital. [facility] is not responsible for any missing/lost belongings or valuables.   Notify your doctor if there is any change in your medical condition (cold, fever, infection).  Wear comfortable clothing (specific to your surgery type) to the hospital.  After surgery, you can help prevent lung complications by doing breathing exercises.  Take deep breaths and cough every 1-2 hours. Your doctor may order a device called an Incentive Spirometer to help you take deep breaths.  If you are being discharged the day of surgery, you will not be allowed to drive home. You will need a responsible adult (18 years or older) to drive you home and stay with you that night.   If you are taking public transportation, you will need to have a responsible adult (18 years or older) with you. Please confirm with your physician that it is acceptable to use public transportation.   Please call the [place] Dept. at [phone] if you have any questions about these instructions.  Surgery Visitation Policy:  Patients undergoing a surgery or procedure may have two family members or support persons with them as long as the person is not COVID-19 positive or experiencing its symptoms.

## 2022-07-06 ENCOUNTER — Encounter: Payer: Self-pay | Admitting: Urology

## 2022-07-06 ENCOUNTER — Other Ambulatory Visit: Payer: Self-pay

## 2022-07-06 ENCOUNTER — Ambulatory Visit: Payer: Medicare Other | Admitting: Certified Registered Nurse Anesthetist

## 2022-07-06 ENCOUNTER — Ambulatory Visit
Admission: RE | Admit: 2022-07-06 | Discharge: 2022-07-06 | Disposition: A | Discharge status: Home / Self Care | Payer: Medicare Other | Attending: Urology | Admitting: Urology

## 2022-07-06 ENCOUNTER — Ambulatory Visit: Payer: Medicare Other

## 2022-07-06 ENCOUNTER — Encounter
Admission: RE | Disposition: A | Discharge status: Home / Self Care | Payer: Self-pay | Source: Home / Self Care | Attending: Urology

## 2022-07-06 DIAGNOSIS — Z79899 Other long term (current) drug therapy: Secondary | ICD-10-CM | POA: Diagnosis not present

## 2022-07-06 DIAGNOSIS — I110 Hypertensive heart disease with heart failure: Secondary | ICD-10-CM | POA: Insufficient documentation

## 2022-07-06 DIAGNOSIS — I509 Heart failure, unspecified: Secondary | ICD-10-CM | POA: Diagnosis not present

## 2022-07-06 DIAGNOSIS — N201 Calculus of ureter: Secondary | ICD-10-CM

## 2022-07-06 DIAGNOSIS — E7800 Pure hypercholesterolemia, unspecified: Secondary | ICD-10-CM | POA: Diagnosis not present

## 2022-07-06 DIAGNOSIS — I5033 Acute on chronic diastolic (congestive) heart failure: Secondary | ICD-10-CM | POA: Diagnosis not present

## 2022-07-06 DIAGNOSIS — N200 Calculus of kidney: Secondary | ICD-10-CM

## 2022-07-06 DIAGNOSIS — N202 Calculus of kidney with calculus of ureter: Secondary | ICD-10-CM | POA: Diagnosis not present

## 2022-07-06 HISTORY — PX: CYSTOSCOPY/URETEROSCOPY/HOLMIUM LASER/STENT PLACEMENT: SHX6546

## 2022-07-06 SURGERY — CYSTOSCOPY/URETEROSCOPY/HOLMIUM LASER/STENT PLACEMENT
Anesthesia: General | Site: Ureter | Laterality: Left

## 2022-07-06 MED ORDER — ACETAMINOPHEN 10 MG/ML IV SOLN: 1000.0000 mg | Freq: Once | INTRAVENOUS | Status: DC | PRN

## 2022-07-06 MED ORDER — ROCURONIUM BROMIDE 100 MG/10ML IV SOLN
INTRAVENOUS | Status: DC | PRN
  Administered 2022-07-06: 40 mg via INTRAVENOUS
  Administered 2022-07-06: 5 mg via INTRAVENOUS

## 2022-07-06 MED ORDER — DEXAMETHASONE SODIUM PHOSPHATE 10 MG/ML IJ SOLN
INTRAMUSCULAR | Status: DC | PRN
  Administered 2022-07-06: 8 mg via INTRAVENOUS

## 2022-07-06 MED ORDER — CEFAZOLIN SODIUM-DEXTROSE 2-4 GM/100ML-% IV SOLN
INTRAVENOUS | Status: AC
  Filled 2022-07-06: qty 100

## 2022-07-06 MED ORDER — ACETAMINOPHEN 10 MG/ML IV SOLN
INTRAVENOUS | Status: AC
  Filled 2022-07-06: qty 100

## 2022-07-06 MED ORDER — CHLORHEXIDINE GLUCONATE 0.12 % MT SOLN: 15.0000 mL | Freq: Once | OROMUCOSAL | Status: AC

## 2022-07-06 MED ORDER — CHLORHEXIDINE GLUCONATE 0.12 % MT SOLN
OROMUCOSAL | Status: AC
  Administered 2022-07-06: 15 mL via OROMUCOSAL
  Filled 2022-07-06: qty 15

## 2022-07-06 MED ORDER — OXYBUTYNIN CHLORIDE 5 MG PO TABS: ORAL_TABLET | ORAL | 0 refills | Status: AC

## 2022-07-06 MED ORDER — IOHEXOL 180 MG/ML  SOLN
INTRAMUSCULAR | Status: DC | PRN
  Administered 2022-07-06: 10 mL

## 2022-07-06 MED ORDER — ROCURONIUM BROMIDE 10 MG/ML (PF) SYRINGE
PREFILLED_SYRINGE | INTRAVENOUS | Status: AC
  Filled 2022-07-06: qty 10

## 2022-07-06 MED ORDER — FAMOTIDINE 20 MG PO TABS
ORAL_TABLET | ORAL | Status: AC
  Administered 2022-07-06: 20 mg via ORAL
  Filled 2022-07-06: qty 1

## 2022-07-06 MED ORDER — SODIUM CHLORIDE 0.9 % IR SOLN
Status: DC | PRN
  Administered 2022-07-06: 3000 mL via INTRAVESICAL

## 2022-07-06 MED ORDER — ONDANSETRON HCL 4 MG/2ML IJ SOLN: 4.0000 mg | Freq: Once | INTRAMUSCULAR | Status: DC | PRN

## 2022-07-06 MED ORDER — ACETAMINOPHEN 10 MG/ML IV SOLN
INTRAVENOUS | Status: DC | PRN
  Administered 2022-07-06: 1000 mg via INTRAVENOUS

## 2022-07-06 MED ORDER — ORAL CARE MOUTH RINSE: 15.0000 mL | Freq: Once | OROMUCOSAL | Status: AC

## 2022-07-06 MED ORDER — FENTANYL CITRATE (PF) 100 MCG/2ML IJ SOLN
INTRAMUSCULAR | Status: AC
  Filled 2022-07-06: qty 2

## 2022-07-06 MED ORDER — LACTATED RINGERS IV SOLN: INTRAVENOUS | Status: DC

## 2022-07-06 MED ORDER — LIDOCAINE HCL (CARDIAC) PF 100 MG/5ML IV SOSY
PREFILLED_SYRINGE | INTRAVENOUS | Status: DC | PRN
  Administered 2022-07-06: 60 mg via INTRAVENOUS

## 2022-07-06 MED ORDER — FAMOTIDINE 20 MG PO TABS: 20.0000 mg | ORAL_TABLET | Freq: Once | ORAL | Status: AC

## 2022-07-06 MED ORDER — PHENYLEPHRINE 80 MCG/ML (10ML) SYRINGE FOR IV PUSH (FOR BLOOD PRESSURE SUPPORT)
PREFILLED_SYRINGE | INTRAVENOUS | Status: AC
  Filled 2022-07-06: qty 10

## 2022-07-06 MED ORDER — CEFAZOLIN SODIUM-DEXTROSE 2-4 GM/100ML-% IV SOLN
2.0000 g | INTRAVENOUS | Status: AC
  Administered 2022-07-06: 2 g via INTRAVENOUS

## 2022-07-06 MED ORDER — PROPOFOL 10 MG/ML IV BOLUS
INTRAVENOUS | Status: DC | PRN
  Administered 2022-07-06: 120 mg via INTRAVENOUS

## 2022-07-06 MED ORDER — ONDANSETRON HCL 4 MG/2ML IJ SOLN
INTRAMUSCULAR | Status: DC | PRN
  Administered 2022-07-06: 4 mg via INTRAVENOUS

## 2022-07-06 MED ORDER — OXYCODONE HCL 5 MG PO TABS: 5.0000 mg | ORAL_TABLET | Freq: Once | ORAL | Status: DC | PRN

## 2022-07-06 MED ORDER — OXYCODONE HCL 5 MG/5ML PO SOLN: 5.0000 mg | Freq: Once | ORAL | Status: DC | PRN

## 2022-07-06 MED ORDER — FENTANYL CITRATE (PF) 100 MCG/2ML IJ SOLN
INTRAMUSCULAR | Status: DC | PRN
  Administered 2022-07-06 (×4): 25 ug via INTRAVENOUS

## 2022-07-06 MED ORDER — FENTANYL CITRATE (PF) 100 MCG/2ML IJ SOLN: 25.0000 ug | INTRAMUSCULAR | Status: DC | PRN

## 2022-07-06 MED ORDER — SUGAMMADEX SODIUM 200 MG/2ML IV SOLN
INTRAVENOUS | Status: DC | PRN
  Administered 2022-07-06: 200 mg via INTRAVENOUS

## 2022-07-06 SURGICAL SUPPLY — 28 items
BAG DRAIN SIEMENS DORNER NS (MISCELLANEOUS) ×1 IMPLANT
BAG DRN NS LF (MISCELLANEOUS) ×1
BASKET ZERO TIP 1.9FR (BASKET) IMPLANT
BRUSH SCRUB EZ 1% IODOPHOR (MISCELLANEOUS) ×1 IMPLANT
BSKT STON RTRVL ZERO TP 1.9FR (BASKET) ×1
CATH URET FLEX-TIP 2 LUMEN 10F (CATHETERS) IMPLANT
CATH URETL OPEN END 6X70 (CATHETERS) IMPLANT
CNTNR SPEC 2.5X3XGRAD LEK (MISCELLANEOUS)
CONT SPEC 4OZ STRL OR WHT (MISCELLANEOUS)
CONTAINER SPEC 2.5X3XGRAD LEK (MISCELLANEOUS) IMPLANT
DRAPE UTILITY 15X26 TOWEL STRL (DRAPES) ×1 IMPLANT
FIBER LASER MOSES 200 DFL (Laser) IMPLANT
GLOVE SURG UNDER POLY LF SZ7.5 (GLOVE) ×1 IMPLANT
GOWN STRL REUS W/ TWL LRG LVL3 (GOWN DISPOSABLE) ×1 IMPLANT
GOWN STRL REUS W/ TWL XL LVL3 (GOWN DISPOSABLE) ×1 IMPLANT
GOWN STRL REUS W/TWL LRG LVL3 (GOWN DISPOSABLE) ×1
GOWN STRL REUS W/TWL XL LVL3 (GOWN DISPOSABLE) ×1
GUIDEWIRE STR DUAL SENSOR (WIRE) ×1 IMPLANT
IV NS IRRIG 3000ML ARTHROMATIC (IV SOLUTION) ×1 IMPLANT
KIT TURNOVER CYSTO (KITS) ×1 IMPLANT
PACK CYSTO AR (MISCELLANEOUS) ×1 IMPLANT
SET CYSTO W/LG BORE CLAMP LF (SET/KITS/TRAYS/PACK) ×1 IMPLANT
SHEATH NAVIGATOR HD 12/14X36 (SHEATH) IMPLANT
STENT URET 6FRX24 CONTOUR (STENTS) IMPLANT
STENT URET 6FRX26 CONTOUR (STENTS) IMPLANT
SURGILUBE 2OZ TUBE FLIPTOP (MISCELLANEOUS) ×1 IMPLANT
VALVE UROSEAL ADJ ENDO (VALVE) IMPLANT
WATER STERILE IRR 500ML POUR (IV SOLUTION) ×1 IMPLANT

## 2022-07-06 NOTE — Transfer of Care (Signed)
Immediate Anesthesia Transfer of Care Note  Patient: Amanda Bruce  Procedure(s) Performed: CYSTOSCOPY/URETEROSCOPY/HOLMIUM LASER/STENT EXCHANGE (Left: Ureter)  Patient Location: PACU  Anesthesia Type:General  Level of Consciousness: awake, alert , and oriented  Airway & Oxygen Therapy: Patient Spontanous Breathing  Post-op Assessment: Report given to RN and Post -op Vital signs reviewed and stable  Post vital signs: Reviewed and stable  Last Vitals:  Vitals Value Taken Time  BP 152/71   Temp    Pulse 72 07/06/22 0913  Resp 12 07/06/22 0913  SpO2 97 % 07/06/22 0913  Vitals shown include unvalidated device data.  Last Pain:  Vitals:   07/06/22 0727  TempSrc: Temporal  PainSc: 0-No pain         Complications: No notable events documented.

## 2022-07-06 NOTE — Anesthesia Preprocedure Evaluation (Signed)
Anesthesia Evaluation  Patient identified by MRN, date of birth, ID band Patient awake  General Assessment Comment:  Patient says she has a hx of PONV, she says "but it's been a long time since I had anesthesia", however her most recent anesthetic was less than a month ago. No documented rescue antiemetics for her two recent procedures.  Reviewed: Allergy & Precautions, H&P , NPO status , Patient's Chart, lab work & pertinent test results  History of Anesthesia Complications Negative for: history of anesthetic complications  Airway Mallampati: II  TM Distance: >3 FB Neck ROM: full    Dental  (+) Poor Dentition, Partial Lower, Partial Upper, Dental Advidsory Given   Pulmonary neg pulmonary ROS, neg shortness of breath, neg sleep apnea, neg recent URI   Pulmonary exam normal breath sounds clear to auscultation       Cardiovascular Exercise Tolerance: Good hypertension, On Medications (-) angina +CHF  (-) Past MI, (-) Cardiac Stents and (-) DOE Normal cardiovascular exam(-) dysrhythmias (-) Valvular Problems/Murmurs Rhythm:Regular Rate:Normal  TTE 2022:  1. Left ventricular ejection fraction, by estimation, is 55 to 60%. The  left ventricle has normal function. The left ventricle has no regional  wall motion abnormalities. Left ventricular diastolic parameters are  consistent with Grade I diastolic  dysfunction (impaired relaxation).   2. Right ventricular systolic function is normal. The right ventricular  size is normal. There is mildly elevated pulmonary artery systolic  pressure.   3. The mitral valve is normal in structure. Mild mitral valve  regurgitation.   4. The aortic valve is tricuspid. Aortic valve regurgitation is not  visualized.   5. The inferior vena cava is dilated in size with <50% respiratory  variability, suggesting right atrial pressure of 15 mmHg.    Neuro/Psych Seizures: .  negative neurological ROS   negative psych ROS   GI/Hepatic negative GI ROS, Neg liver ROS,neg GERD  ,,  Endo/Other  negative endocrine ROS    Renal/GU Renal disease (kidney stones)     Musculoskeletal  (+) Arthritis ,    Abdominal   Peds  Hematology negative hematology ROS (+)   Anesthesia Other Findings  Past Medical History: No date: History of kidney stones     Comment:  30+ years ago No date: Osteoporosis  History reviewed. No pertinent surgical history.  BMI    Body Mass Index: 17.23 kg/m      Reproductive/Obstetrics negative OB ROS                              Anesthesia Physical Anesthesia Plan  ASA: 2  Anesthesia Plan: General   Post-op Pain Management:    Induction: Intravenous  PONV Risk Score and Plan: 4 or greater and Propofol infusion, TIVA, Treatment may vary due to age or medical condition and Ondansetron  Airway Management Planned: LMA  Additional Equipment: None  Intra-op Plan:   Post-operative Plan: Extubation in OR  Informed Consent: I have reviewed the patients History and Physical, chart, labs and discussed the procedure including the risks, benefits and alternatives for the proposed anesthesia with the patient or authorized representative who has indicated his/her understanding and acceptance.     Dental Advisory Given  Plan Discussed with: Anesthesiologist, CRNA and Surgeon  Anesthesia Plan Comments: (Patient consented for risks of anesthesia including but not limited to:  - adverse reactions to medications - damage to eyes, teeth, lips or other oral mucosa - nerve damage due  to positioning  - sore throat or hoarseness - Damage to heart, brain, nerves, lungs, other parts of body or loss of life  Patient voiced understanding.)         Anesthesia Quick Evaluation

## 2022-07-06 NOTE — Interval H&P Note (Signed)
History and Physical Interval Note:  Status post left ureteral stent placement 06/16/2022 for an obstructing stone with infection.  Presents today for definitive stone treatment with ureteroscopic removal.  CV: RRR Lungs: Clear  07/06/2022 8:08 AM  Amanda Bruce  has presented today for surgery, with the diagnosis of Left Ureteral Stone.  The various methods of treatment have been discussed with the patient and family. After consideration of risks, benefits and other options for treatment, the patient has consented to  Procedure(s): CYSTOSCOPY/URETEROSCOPY/HOLMIUM LASER/STENT EXCHANGE (Left) as a surgical intervention.  The patient's history has been reviewed, patient examined, no change in status, stable for surgery.  I have reviewed the patient's chart and labs.  Questions were answered to the patient's satisfaction.     [place]

## 2022-07-06 NOTE — Anesthesia Procedure Notes (Signed)
Procedure Name: Intubation Date/Time: 07/06/2022 8:36 AM  Performed by: Demetrius Charity, CRNAPre-anesthesia Checklist: Patient identified, Patient being monitored, Timeout performed, Emergency Drugs available and Suction available Patient Re-evaluated:Patient Re-evaluated prior to induction Oxygen Delivery Method: Circle system utilized Preoxygenation: Pre-oxygenation with 100% oxygen Induction Type: IV induction Ventilation: Mask ventilation without difficulty Laryngoscope Size: 3 and McGraph Grade View: Grade I Tube type: Oral Tube size: 6.5 mm Number of attempts: 1 Airway Equipment and Method: Stylet Placement Confirmation: ETT inserted through vocal cords under direct vision, positive ETCO2 and breath sounds checked- equal and bilateral Secured at: 22 cm Tube secured with: Tape Dental Injury: Teeth and Oropharynx as per pre-operative assessment

## 2022-07-06 NOTE — Progress Notes (Signed)
Patient and pt's friend, Sonny Dandy), agreed for her friend Jackelyn Poling to stay with her overnight for the next 24 hrs post procedure after the procedure. Continue to monitor.

## 2022-07-06 NOTE — Discharge Instructions (Addendum)
AMBULATORY SURGERY  DISCHARGE INSTRUCTIONS   The drugs that you were given will stay in your system until tomorrow so for the next 24 hours you should not:  Drive an automobile Make any legal decisions Drink any alcoholic beverage   You may resume regular meals tomorrow.  Today it is better to start with liquids and gradually work up to solid foods.  You may eat anything you prefer, but it is better to start with liquids, then soup and crackers, and gradually work up to solid foods.   Please notify your doctor immediately if you have any unusual bleeding, trouble breathing, redness and pain at the surgery site, drainage, fever, or pain not relieved by medication.    Additional Instructions:   DISCHARGE INSTRUCTIONS FOR KIDNEY STONE/URETERAL STENT   MEDICATIONS:  1. Resume all your other meds from home.  2.  AZO (over-the-counter) can help with the burning/stinging when you urinate. 3.  Oxybutynin is for bladder/stent irritation.  Rx was sent to your pharmacy.  ACTIVITY:  1. May resume regular activities in 24 hours. 2. No driving while on narcotic pain medications  3. Drink plenty of water  4. Continue to walk at home - you can still get blood clots when you are at home, so keep active, but don't over do it.  5. May return to work/school tomorrow or when you feel ready   BATHING:  1. You can shower. 2. You have a string coming from your urethra: The stent string is attached to your ureteral stent. Do not pull on this.   SIGNS/SYMPTOMS TO CALL:  Common postoperative symptoms include urinary frequency, urgency, bladder spasm and blood in the urine  Please call us if you have a fever greater than 101.5, uncontrolled nausea/vomiting, uncontrolled pain, dizziness, unable to urinate, excessively bloody urine, chest pain, shortness of breath, leg swelling, leg pain, or any other concerns or questions.   You can reach [place] at [phone].   FOLLOW-UP:  1. You will be contacted  for follow-up appointment in approximately 1 month 2. You have a string attached to your stent, you may remove it on Thursday, 07/08/2022. To do this, pull the string until the stent is completely removed. You may feel an odd sensation in your back.  If you prefer to have it removed in our office please call and time will be arranged for you to come in to have the stent removed      Please contact your physician with any problems or Same Day Surgery at [phone], Monday through Friday 6 am to 4 pm, or [place] at [facility] number at [phone].

## 2022-07-06 NOTE — Anesthesia Postprocedure Evaluation (Signed)
Anesthesia Post Note  Patient: Amanda Bruce  Procedure(s) Performed: CYSTOSCOPY/URETEROSCOPY/HOLMIUM LASER/STENT EXCHANGE (Left: Ureter)  Patient location during evaluation: PACU Anesthesia Type: General Level of consciousness: awake and alert Pain management: pain level controlled Vital Signs Assessment: post-procedure vital signs reviewed and stable Respiratory status: spontaneous breathing, nonlabored ventilation, respiratory function stable and patient connected to nasal cannula oxygen Cardiovascular status: blood pressure returned to baseline and stable Postop Assessment: no apparent nausea or vomiting Anesthetic complications: no  No notable events documented.   Last Vitals:  Vitals:   07/06/22 0942 07/06/22 0956  BP: (!) 145/74 (!) 160/78  Pulse: 70 78  Resp: (!) 23 16  Temp: 36.4 C 36.4 C  SpO2: 93% 100%    Last Pain:  Vitals:   07/06/22 0956  TempSrc: Temporal  PainSc: 0-No pain                 Dimas Millin

## 2022-07-06 NOTE — Op Note (Signed)
   Preoperative diagnosis:  Left distal ureteral calculus Left nephrolithiasis  Postoperative diagnosis:  Passed ureteral calculus Left nephrolithiasis  Procedure: Cystoscopy with left ureteral stent exchange Left ureteroscopy with stone removal  Surgeon: Abbie Sons, MD  Anesthesia: General  Complications: None  Intraoperative findings:  4 mm distal ureteral calculus was not present on ureteroscopy Multiple Randall's plaques.  2 and 3 mm lower calyceal calculi removed with basket  EBL: Minimal  Specimens: Stone for analysis  Indication: Amanda Bruce is a 82 y.o. female with a history of recurrent stone disease.  She underwent placement of a left ureteral stent for an obstructing 4 mm distal ureteral calculus with infection 06/16/2022.  She presents today for definitive stone management.  CT did show nonobstructing renal calculi after reviewing the management options for treatment, she elected to proceed with the above surgical procedure(s). We have discussed the potential benefits and risks of the procedure, side effects of the proposed treatment, the likelihood of the patient achieving the goals of the procedure, and any potential problems that might occur during the procedure or recuperation. Informed consent has been obtained.  Description of procedure:  The patient was taken to the operating room and general anesthesia was induced.  The patient was placed in the dorsal lithotomy position, prepped and draped in the usual sterile fashion, and preoperative antibiotics were administered. A preoperative time-out was performed.   A 21 French cystoscope was lubricated and passed per urethra.  The left ureteral stent was grasped with endoscopic forceps and brought out through the urethral meatus.  A 0.038 Sensor wire was placed through the stent and advanced into the region of the left renal pelvis under fluoroscopic guidance.  The cystoscope was removed and a 4.5 French  semirigid ureteroscope was passed per urethra.  The ureteroscope was easily advanced into the left ureter alongside the guidewire and advanced to the proximal ureter with no stone identified.  No mucosal abnormalities were identified.  The semirigid scope was removed and an 8 [place] single channel digital flexible ureteroscope was passed per urethra.  The ureteroscope was easily advanced into the left ureter alongside the guidewire and advanced into the renal pelvis under direct vision.  Retrograde pyelogram was performed and all calyces were examined with findings as described above.  2 small lower calyceal calculi were identified and removed with a 1.9 French 0 tip nitinol basket.  A 1F/24 cm Contour ureteral stent was then placed under fluoroscopic guidance.  The bladder was emptied with the cystoscope sheath.  After anesthetic reversal she was transported to the PACU in stable condition.  Plan: The stent was left attached to a tether and she was instructed to remove on Thursday, 07/08/2022 Postop follow-up 1 month to discuss stone analysis and metabolic evaluation   Abbie Sons, M.D.

## 2022-07-13 LAB — CALCULI, WITH PHOTOGRAPH (CLINICAL LAB)
Calcium Oxalate Dihydrate: 20 %
Calcium Oxalate Monohydrate: 80 %
Weight Calculi: 6 mg

## 2022-08-11 ENCOUNTER — Ambulatory Visit (INDEPENDENT_AMBULATORY_CARE_PROVIDER_SITE_OTHER): Payer: Medicare Other | Admitting: Urology

## 2022-08-11 ENCOUNTER — Encounter: Payer: Self-pay | Admitting: Urology

## 2022-08-11 VITALS — BP 147/80 | HR 99 | Ht 66.0 in | Wt 118.0 lb

## 2022-08-11 DIAGNOSIS — N201 Calculus of ureter: Secondary | ICD-10-CM

## 2022-08-11 DIAGNOSIS — Z87442 Personal history of urinary calculi: Secondary | ICD-10-CM

## 2022-08-11 DIAGNOSIS — N200 Calculus of kidney: Secondary | ICD-10-CM

## 2022-08-11 NOTE — Patient Instructions (Signed)

## 2022-08-11 NOTE — Progress Notes (Signed)
08/11/2022 10:43 AM   Amanda Bruce 1940/09/28 [id number]  Referring provider: No referring provider defined for this encounter.  Chief Complaint  Patient presents with   Nephrolithiasis    HPI: 82 y.o. female presents for postop follow-up.  History sepsis from urinary source secondary to an obstructing 4 mm distal calculus status post stent placement 06/16/2022 Underwent stent removal ureteroscopy 07/06/2022.  The distal calculus was no longer present.  Nonobstructing renal calculi were treated Stone analysis mixed calcium oxalate 80/20 She has had dull intermittent left flank pain.  No fever, chills or voiding symptoms.  PMH: Past Medical History:  Diagnosis Date   Chronic diastolic CHF (congestive heart failure) (HCC)    History of kidney stones    30+ years ago   Hypercholesterolemia    Left ureteral stone 05/2022   Memory deficit    Osteoarthritis of both knees    Osteoporosis    Primary hypertension     Surgical History: Past Surgical History:  Procedure Laterality Date   CYSTOSCOPY WITH STENT PLACEMENT Left 07/08/2020   Procedure: CYSTOSCOPY WITH STENT PLACEMENT;  Surgeon: Abbie Sons, MD;  Location: [facility];  Service: Urology;  Laterality: Left;   CYSTOSCOPY/RETROGRADE/URETEROSCOPY Left 08/12/2020   Procedure: CYSTOSCOPY WITH URETEROSCOPY, with STENT removal;  Surgeon: Abbie Sons, MD;  Location: [facility];  Service: Urology;  Laterality: Left;   CYSTOSCOPY/URETEROSCOPY/HOLMIUM LASER/STENT PLACEMENT Left 06/16/2022   Procedure: CYSTOSCOPY/URETEROSCOPY/RETROGRADE PYELOGRAM/STENT PLACEMENT;  Surgeon: Abbie Sons, MD;  Location: [facility];  Service: Urology;  Laterality: Left;   CYSTOSCOPY/URETEROSCOPY/HOLMIUM LASER/STENT PLACEMENT Left 07/06/2022   Procedure: CYSTOSCOPY/URETEROSCOPY/HOLMIUM LASER/STENT EXCHANGE;  Surgeon: Abbie Sons, MD;  Location: [facility];  Service: Urology;  Laterality: Left;    Home Medications:  Allergies as of  08/11/2022   No Known Allergies      Medication List        Accurate as of August 11, 2022 10:43 AM. If you have any questions, ask your nurse or doctor.          acetaminophen 325 MG tablet Commonly known as: TYLENOL Take 325-650 mg by mouth every 6 (six) hours as needed (headaches.).   amLODipine 5 MG tablet Commonly known as: NORVASC Take 1 tablet (5 mg total) by mouth daily.   ascorbic acid 500 MG tablet Commonly known as: VITAMIN C Take 500 mg by mouth daily.   aspirin EC 81 MG tablet Take 81 mg by mouth daily.   cholecalciferol 25 MCG (1000 UNIT) tablet Commonly known as: VITAMIN D3 Take 1,000 Units by mouth daily.   oxybutynin 5 MG tablet Commonly known as: DITROPAN 1 tab tid prn frequency,urgency, bladder spasm   tretinoin 0.05 % cream Commonly known as: RETIN-A Apply topically at bedtime. What changed:  how much to take when to take this   Vitamin A 2400 MCG (8000 UT) Caps Take 2,400 mcg by mouth daily.   Vitamin E 450 MG (1000 UT) Caps Take 1 capsule by mouth daily.        Allergies: No Known Allergies  Family History: Family History  Problem Relation Age of Onset   Hypertension Mother    Cancer Father    Hypertension Sister    Cancer Brother    Breast cancer Other     Social History:  reports that she has never smoked. She has never used smokeless tobacco. She reports that she does not drink alcohol and does not use drugs.   Physical Exam: BP (!) 147/80   Pulse  99   Ht 5' 6"$  (1.676 m)   Wt 118 lb (53.5 kg)   BMI 19.05 kg/m   Constitutional:  Alert and oriented, No acute distress. HEENT: Amanda Bruce AT Respiratory: Normal respiratory effort, no increased work of breathing. Psychiatric: Normal mood and affect.  Laboratory Data:  Urinalysis Microscopy >30 WBC   Assessment & Plan:    1.  Recurrent nephrolithiasis She desires to pursue a metabolic evaluation to include 24 urine study and blood work Having intermittent mild left  flank pain.  RUS ordered.  UA today with pyuria and urine culture ordered   Abbie Sons, [place] [address] [place], [place] [address] [phone]

## 2022-08-12 ENCOUNTER — Encounter: Payer: Self-pay | Admitting: Urology

## 2022-08-12 LAB — URINALYSIS, COMPLETE
Bilirubin, UA: NEGATIVE
Glucose, UA: NEGATIVE
Ketones, UA: NEGATIVE
Nitrite, UA: NEGATIVE
Specific Gravity, UA: 1.025 (ref 1.005–1.030)
Urobilinogen, Ur: 0.2 mg/dL (ref 0.2–1.0)
pH, UA: 5.5 (ref 5.0–7.5)

## 2022-08-12 LAB — MICROSCOPIC EXAMINATION: WBC, UA: >30 /hpf — AB (ref 0–5)

## 2022-08-16 ENCOUNTER — Ambulatory Visit
Admission: RE | Admit: 2022-08-16 | Discharge: 2022-08-16 | Disposition: A | Discharge status: Home / Self Care | Payer: Medicare Other | Source: Ambulatory Visit | Attending: Urology | Admitting: Urology

## 2022-08-16 DIAGNOSIS — Z87442 Personal history of urinary calculi: Secondary | ICD-10-CM | POA: Diagnosis not present

## 2022-08-16 DIAGNOSIS — N281 Cyst of kidney, acquired: Secondary | ICD-10-CM | POA: Diagnosis not present

## 2022-08-16 DIAGNOSIS — N200 Calculus of kidney: Secondary | ICD-10-CM | POA: Diagnosis not present

## 2022-08-17 ENCOUNTER — Telehealth: Payer: Self-pay | Admitting: *Deleted

## 2022-08-17 LAB — CULTURE, URINE COMPREHENSIVE

## 2022-08-17 NOTE — Telephone Encounter (Signed)
Left message on voice mail per DPR .  °

## 2022-08-17 NOTE — Telephone Encounter (Signed)
-----   Message from Abbie Sons, MD sent at 08/17/2022  3:42 PM EST ----- Urine culture was negative for infection

## 2022-08-18 ENCOUNTER — Telehealth: Payer: Self-pay | Admitting: *Deleted

## 2022-08-18 NOTE — Telephone Encounter (Signed)
Notified patient as instructed, patient pleased °

## 2022-08-18 NOTE — Telephone Encounter (Signed)
-----   Message from Abbie Sons, MD sent at 08/18/2022  8:43 AM EST ----- Renal ultrasound showed no abnormalities.

## 2022-10-06 ENCOUNTER — Ambulatory Visit (INDEPENDENT_AMBULATORY_CARE_PROVIDER_SITE_OTHER): Payer: Medicare Other | Admitting: Nurse Practitioner

## 2022-10-06 ENCOUNTER — Encounter: Payer: Self-pay | Admitting: Nurse Practitioner

## 2022-10-06 VITALS — BP 127/65 | HR 73 | Temp 97.9°F | Ht 65.98 in | Wt 131.5 lb

## 2022-10-06 DIAGNOSIS — Z0000 Encounter for general adult medical examination without abnormal findings: Secondary | ICD-10-CM

## 2022-10-06 DIAGNOSIS — E7800 Pure hypercholesterolemia, unspecified: Secondary | ICD-10-CM | POA: Diagnosis not present

## 2022-10-06 DIAGNOSIS — R413 Other amnesia: Secondary | ICD-10-CM

## 2022-10-06 DIAGNOSIS — I10 Essential (primary) hypertension: Secondary | ICD-10-CM | POA: Diagnosis not present

## 2022-10-06 DIAGNOSIS — I5032 Chronic diastolic (congestive) heart failure: Secondary | ICD-10-CM | POA: Diagnosis not present

## 2022-10-06 DIAGNOSIS — M25561 Pain in right knee: Secondary | ICD-10-CM

## 2022-10-06 DIAGNOSIS — Z7189 Other specified counseling: Secondary | ICD-10-CM | POA: Diagnosis not present

## 2022-10-06 NOTE — Progress Notes (Unsigned)
BP 127/65   Pulse 73   Temp 97.9 F (36.6 C) (Oral)   Ht 5' 5.98" (1.676 m)   Wt 131 lb 8 oz (59.6 kg)   SpO2 97%   BMI 21.23 kg/m    Subjective:    Patient ID: Amanda Bruce, female    DOB: 1941-04-08, 82 y.o.   MRN: [id number]  HPI: Amanda Bruce is a 82 y.o. female presenting on 10/06/2022 for comprehensive medical examination. Current medical complaints include: knee pain  She currently lives with: Menopausal Symptoms: no  HYPERTENSION / HYPERLIPIDEMIA Satisfied with current treatment? yes Duration of hypertension: years BP monitoring frequency: not checking BP range:  BP medication side effects: no Past BP meds: amlodipine Duration of hyperlipidemia: years Cholesterol medication side effects: no Cholesterol supplements: none Past cholesterol medications: none Medication compliance: excellent compliance Aspirin: no Recent stressors: no Recurrent headaches: no Visual changes: no Palpitations: no Dyspnea: no Chest pain: no Lower extremity edema: no Dizzy/lightheaded: no  KNEE PAIN Duration: weeks Involved knee: right Mechanism of injury: unknown Location:diffuse Onset: sudden Severity: 8/10  Quality:  sharp Frequency: constant Radiation: no Aggravating factors: weight bearing, walking, and movement  Alleviating factors:  tylenol and aspercream and brace  Status: worse Treatments attempted:  aspercream, tylenol and   brace Relief with NSAIDs?:  mild Weakness with weight bearing or walking: yes Sensation of giving way: no Locking: no Popping: no Bruising: no Swelling: no Redness: no Paresthesias/decreased sensation: no Fevers: no    Functional Status Survey: Is the patient deaf or have difficulty hearing?: No Does the patient have difficulty seeing, even when wearing glasses/contacts?: No Does the patient have difficulty concentrating, remembering, or making decisions?: No Does the patient have difficulty walking or climbing stairs?:  No Does the patient have difficulty dressing or bathing?: No Does the patient have difficulty doing errands alone such as visiting a doctor's office or shopping?: No     02/08/2022    1:49 PM 01/28/2022    8:57 AM 12/28/2021   11:21 AM 07/03/2020    8:27 AM 06/14/2019    9:32 AM  Fall Risk   Falls in the past year? 0 0 0 0 0  Number falls in past yr: 0 0 0  0  Injury with Fall? 0 0 0  0  Risk for fall due to : No Fall Risks No Fall Risks No Fall Risks    Follow up Falls evaluation completed Falls evaluation completed Falls evaluation completed      Depression Screen    10/06/2022    4:28 PM 02/08/2022    1:49 PM 01/28/2022    8:57 AM 12/28/2021   11:22 AM 07/03/2020    8:27 AM  Depression screen PHQ 2/9  Decreased Interest 0 0 0 3 0  Down, Depressed, Hopeless 0 0 0 0 0  PHQ - 2 Score 0 0 0 3 0  Altered sleeping 0 0 0 0   Tired, decreased energy 0 0 0 0   Change in appetite 0 0 0 0   Feeling bad or failure about yourself  0 0 0 0   Trouble concentrating 0 0 0 0   Moving slowly or fidgety/restless 0 0 0 0   Suicidal thoughts 0 0 0 0   PHQ-9 Score 0 0 0 3   Difficult doing work/chores Not difficult at all Not difficult at all Not difficult at all Not difficult at all      Advanced Directives Does patient have  a HCPOA?    yes If yes, name and contact information:  Does patient have a living will or MOST form?  yes  Past Medical History:  Past Medical History:  Diagnosis Date   Chronic diastolic CHF (congestive heart failure)    History of kidney stones    30+ years ago   Hypercholesterolemia    Left ureteral stone 05/2022   Memory deficit    Osteoarthritis of both knees    Osteoporosis    Primary hypertension     Surgical History:  Past Surgical History:  Procedure Laterality Date   CYSTOSCOPY WITH STENT PLACEMENT Left 07/08/2020   Procedure: CYSTOSCOPY WITH STENT PLACEMENT;  Surgeon: Riki Altes, MD;  Location: [facility];  Service: Urology;  Laterality: Left;    CYSTOSCOPY/RETROGRADE/URETEROSCOPY Left 08/12/2020   Procedure: CYSTOSCOPY WITH URETEROSCOPY, with STENT removal;  Surgeon: Riki Altes, MD;  Location: [facility];  Service: Urology;  Laterality: Left;   CYSTOSCOPY/URETEROSCOPY/HOLMIUM LASER/STENT PLACEMENT Left 06/16/2022   Procedure: CYSTOSCOPY/URETEROSCOPY/RETROGRADE PYELOGRAM/STENT PLACEMENT;  Surgeon: Riki Altes, MD;  Location: [facility];  Service: Urology;  Laterality: Left;   CYSTOSCOPY/URETEROSCOPY/HOLMIUM LASER/STENT PLACEMENT Left 07/06/2022   Procedure: CYSTOSCOPY/URETEROSCOPY/HOLMIUM LASER/STENT EXCHANGE;  Surgeon: Riki Altes, MD;  Location: [facility];  Service: Urology;  Laterality: Left;    Medications:  Current Outpatient Medications on File Prior to Visit  Medication Sig   acetaminophen (TYLENOL) 325 MG tablet Take 325-650 mg by mouth every 6 (six) hours as needed (headaches.).   aspirin EC 81 MG tablet Take 81 mg by mouth daily.   cholecalciferol (VITAMIN D3) 25 MCG (1000 UNIT) tablet Take 1,000 Units by mouth daily.   oxybutynin (DITROPAN) 5 MG tablet 1 tab tid prn frequency,urgency, bladder spasm   tretinoin (RETIN-A) 0.05 % cream Apply topically at bedtime. (Patient taking differently: Apply 1 application  topically 3 (three) times a week.)   Vitamin A 2400 MCG (8000 UT) CAPS Take 2,400 mcg by mouth daily.   vitamin C (ASCORBIC ACID) 500 MG tablet Take 500 mg by mouth daily.   Vitamin E 450 MG (1000 UT) CAPS Take 1 capsule by mouth daily.   amLODipine (NORVASC) 5 MG tablet Take 1 tablet (5 mg total) by mouth daily.   No current facility-administered medications on file prior to visit.    Allergies:  No Known Allergies  Social History:  Social History   Socioeconomic History   Marital status: Widowed    Spouse name: Not on file   Number of children: Not on file   Years of education: Not on file   Highest education level: Not on file  Occupational History   Occupation: [employer]ologist for [employer]    Comment: retired  Tobacco Use   Smoking status: Never   Smokeless tobacco: Never  Vaping Use   Vaping Use: Never used  Substance and Sexual Activity   Alcohol use: No   Drug use: No   Sexual activity: Not Currently  Other Topics Concern   Not on file  Social History Narrative   Patient lives alone. Very active. Dances 3 nights a week , mows 7 acres of land!!   Feels safe in her home.   Social Determinants of Health   Financial Resource Strain: Not on file  Food Insecurity: No Food Insecurity (06/16/2022)   Hunger Vital Sign    Worried About Running Out of Food in the Last Year: Never true    Ran Out of Food in the Last Year:  Never true  Transportation Needs: No Transportation Needs (06/16/2022)   PRAPARE - Administrator, Civil ServiceTransportation    Lack of Transportation (Medical): No    Lack of Transportation (Non-Medical): No  Physical Activity: Not on file  Stress: Not on file  Social Connections: Not on file  Intimate Partner Violence: Not At Risk (06/16/2022)   Humiliation, Afraid, Rape, and Kick questionnaire    Fear of Current or Ex-Partner: No    Emotionally Abused: No    Physically Abused: No    Sexually Abused: No   Social History   Tobacco Use  Smoking Status Never  Smokeless Tobacco Never   Social History   Substance and Sexual Activity  Alcohol Use No    Family History:  Family History  Problem Relation Age of Onset   Hypertension Mother    Cancer Father    Hypertension Sister    Cancer Brother    Breast cancer Other     Past medical history, surgical history, medications, allergies, family history and social history reviewed with patient today and changes made to appropriate areas of the chart.   Review of Systems  Eyes:  Negative for blurred vision and double vision.  Respiratory:  Negative for shortness of breath.   Cardiovascular:  Negative for chest pain, palpitations and leg swelling.  Musculoskeletal:        Right knee pain  Neurological:   Negative for dizziness and headaches.    All other ROS negative except what is listed above and in the HPI.      Objective:    BP 127/65   Pulse 73   Temp 97.9 F (36.6 C) (Oral)   Ht 5' 5.98" (1.676 m)   Wt 131 lb 8 oz (59.6 kg)   SpO2 97%   BMI 21.23 kg/m   Wt Readings from Last 3 Encounters:  10/06/22 131 lb 8 oz (59.6 kg)  08/11/22 118 lb (53.5 kg)  07/06/22 128 lb (58.1 kg)    No results found.  Physical Exam Vitals and nursing note reviewed.  Constitutional:      General: She is not in acute distress.    Appearance: Normal appearance. She is normal weight. She is not ill-appearing, toxic-appearing or diaphoretic.  HENT:     Head: Normocephalic.     Right Ear: External ear normal.     Left Ear: External ear normal.     Nose: Nose normal.     Mouth/Throat:     Mouth: Mucous membranes are moist.     Pharynx: Oropharynx is clear.  Eyes:     General:        Right eye: No discharge.        Left eye: No discharge.     Extraocular Movements: Extraocular movements intact.     Conjunctiva/sclera: Conjunctivae normal.     Pupils: Pupils are equal, round, and reactive to light.  Cardiovascular:     Rate and Rhythm: Normal rate and regular rhythm.     Heart sounds: No murmur heard. Pulmonary:     Effort: Pulmonary effort is normal. No respiratory distress.     Breath sounds: Normal breath sounds. No wheezing or rales.  Musculoskeletal:        General: No swelling, tenderness, deformity or signs of injury. Normal range of motion.     Cervical back: Normal range of motion and neck supple.     Right lower leg: No edema.     Left lower leg: No edema.  Skin:  General: Skin is warm and dry.     Capillary Refill: Capillary refill takes less than 2 seconds.  Neurological:     General: No focal deficit present.     Mental Status: She is alert and oriented to person, place, and time. Mental status is at baseline.  Psychiatric:        Mood and Affect: Mood normal.         Behavior: Behavior normal.        Thought Content: Thought content normal.        Judgment: Judgment normal.        06/14/2019    9:36 AM  6CIT Screen  What Year? 0 points  What month? 3 points  What time? 0 points  Count back from 20 0 points  Months in reverse 4 points  Repeat phrase 4 points  Total Score 11 points    Cognitive Testing - 6-CIT  Correct? Score   What year is it? yes 0 Yes = 0    No = 4  What month is it? yes 0 Yes = 0    No = 3  Remember:     Floyde Parkins, [address], [place]     What time is it? yes 0 Yes = 0    No = 3  Count backwards from 20 to 1 yes 0 Correct = 0    1 error = 2   More than 1 error = 4  Say the months of the year in reverse. no 4 Correct = 0    1 error = 2   More than 1 error = 4  What address did I ask you to remember? yes 2 Correct = 0  1 error = 2    2 error = 4    3 error = 6    4 error = 8    All wrong = 10       TOTAL SCORE  6/28   Interpretation:  Abnormal  Normal (0-7) Abnormal (8-28)   Results for orders placed or performed in visit on 10/06/22  Comp Met (CMET)  Result Value Ref Range   Glucose 109 (H) 70 - 99 mg/dL   BUN 27 8 - 27 mg/dL   Creatinine, Ser 1.61 0.57 - 1.00 mg/dL   eGFR 89 >09 UE/AVW/0.98   BUN/Creatinine Ratio 44 (H) 12 - 28   Sodium 143 134 - 144 mmol/L   Potassium 4.4 3.5 - 5.2 mmol/L   Chloride 106 96 - 106 mmol/L   CO2 25 20 - 29 mmol/L   Calcium 9.2 8.7 - 10.3 mg/dL   Total Protein 7.0 6.0 - 8.5 g/dL   Albumin 4.5 3.7 - 4.7 g/dL   Globulin, Total 2.5 1.5 - 4.5 g/dL   Albumin/Globulin Ratio 1.8 1.2 - 2.2   Bilirubin Total 0.2 0.0 - 1.2 mg/dL   Alkaline Phosphatase 130 (H) 44 - 121 IU/L   AST 22 0 - 40 IU/L   ALT 11 0 - 32 IU/L  Lipid Profile  Result Value Ref Range   Cholesterol, Total 197 100 - 199 mg/dL   Triglycerides 119 0 - 149 mg/dL   HDL 63 >14 mg/dL   VLDL Cholesterol Cal 21 5 - 40 mg/dL   LDL Chol Calc (NIH) 782 (H) 0 - 99 mg/dL   Chol/HDL Ratio 3.1 0.0 - 4.4 ratio       Assessment & Plan:   Problem List Items Addressed This Visit  Cardiovascular and Mediastinum   Chronic diastolic CHF (congestive heart failure) (Chronic)   Primary hypertension    Chronic.  Controlled.  Continue with current medication regimen of Amlodipine daily.  Labs ordered today.  Return to clinic in 6 months for reevaluation.  Call sooner if concerns arise.        Relevant Orders   Comp Met (CMET) (Completed)     Other   Hypercholesteremia   Relevant Orders   Lipid Profile (Completed)   Advanced care planning/counseling discussion    A voluntary discussion about advance care planning including the explanation and discussion of advance directives was extensively discussed  with the patient for 3 minutes with patient.   Explanation about the health care proxy and Living will was reviewed and packet with forms with explanation of how to fill them out was given.  During this discussion, the patient was able to identify a health care proxy as her son and already has a living will.  Does not wish to make changes at this time.        Memory deficit   Other Visit Diagnoses     Encounter for annual wellness exam in Medicare patient    -  Primary   Acute pain of right knee       Will obtain xray of right knee.  May need injection to help with pain.   Relevant Orders   DG Knee Complete 4 Views Right        Preventative Services:  AAA screening: NA Health Risk Assessment and Personalized Prevention Plan: Up to date Bone Mass Measurements: Up to date- 2023 Breast Cancer Screening: Declined CVD Screening: Up to date Cervical Cancer Screening: NA Colon Cancer Screening: NA Depression Screening: Done today Diabetes Screening: Done today Glaucoma Screening: Up to date Hepatitis B vaccine: NA Hepatitis C screening: Up to date HIV Screening:Up to date Flu Vaccine: Up to date Lung cancer Screening: NA Obesity Screening: Up to date Pneumonia Vaccines (2): Up to date STI  Screening: NA  Follow up plan: Return in about 6 months (around 04/07/2023) for HTN, HLD, DM2 FU.   LABORATORY TESTING:  - Pap smear: not applicable  IMMUNIZATIONS:   - Tdap: Tetanus vaccination status reviewed: last tetanus booster within 10 years. - Influenza: Postponed to flu season - Pneumovax: Up to date - Prevnar: Up to date - Zostavax vaccine:  Discussed at visit today  SCREENING: -Mammogram:  Declined   - Colonoscopy: Not applicable  - Bone Density: Up to date  -Hearing Test: Not applicable  -Spirometry: Not applicable   PATIENT COUNSELING:   Advised to take 1 mg of folate supplement per day if capable of pregnancy.   Sexuality: Discussed sexually transmitted diseases, partner selection, use of condoms, avoidance of unintended pregnancy  and contraceptive alternatives.   Advised to avoid cigarette smoking.  I discussed with the patient that most people either abstain from alcohol or drink within safe limits (<=14/week and <=4 drinks/occasion for males, <=7/weeks and <= 3 drinks/occasion for females) and that the risk for alcohol disorders and other health effects rises proportionally with the number of drinks per week and how often a drinker exceeds daily limits.  Discussed cessation/primary prevention of drug use and availability of treatment for abuse.   Diet: Encouraged to adjust caloric intake to maintain  or achieve ideal body weight, to reduce intake of dietary saturated fat and total fat, to limit sodium intake by avoiding high sodium foods and not adding table salt, and  to maintain adequate dietary potassium and calcium preferably from fresh fruits, vegetables, and low-fat dairy products.    stressed the importance of regular exercise  Injury prevention: Discussed safety belts, safety helmets, smoke detector, smoking near bedding or upholstery.   Dental health: Discussed importance of regular tooth brushing, flossing, and dental visits.    NEXT PREVENTATIVE  PHYSICAL DUE IN 1 YEAR. Return in about 6 months (around 04/07/2023) for HTN, HLD, DM2 FU.

## 2022-10-06 NOTE — Assessment & Plan Note (Signed)
Chronic.  Controlled.  Continue with current medication regimen of Amlodipine daily.  Labs ordered today.  Return to clinic in 6 months for reevaluation.  Call sooner if concerns arise.   

## 2022-10-07 ENCOUNTER — Ambulatory Visit: Payer: Medicare Other | Admitting: Nurse Practitioner

## 2022-10-07 ENCOUNTER — Ambulatory Visit
Admission: RE | Admit: 2022-10-07 | Discharge: 2022-10-07 | Disposition: A | Discharge status: Home / Self Care | Payer: Medicare Other | Attending: Nurse Practitioner | Admitting: Nurse Practitioner

## 2022-10-07 ENCOUNTER — Ambulatory Visit
Admission: RE | Admit: 2022-10-07 | Discharge: 2022-10-07 | Disposition: A | Discharge status: Home / Self Care | Payer: Medicare Other | Source: Ambulatory Visit | Attending: Nurse Practitioner | Admitting: Nurse Practitioner

## 2022-10-07 DIAGNOSIS — M25561 Pain in right knee: Secondary | ICD-10-CM

## 2022-10-07 DIAGNOSIS — M1711 Unilateral primary osteoarthritis, right knee: Secondary | ICD-10-CM | POA: Diagnosis not present

## 2022-10-07 LAB — COMPREHENSIVE METABOLIC PANEL
ALT: 11 IU/L (ref 0–32)
AST: 22 IU/L (ref 0–40)
Albumin/Globulin Ratio: 1.8 (ref 1.2–2.2)
Albumin: 4.5 g/dL (ref 3.7–4.7)
Alkaline Phosphatase: 130 IU/L — ABNORMAL HIGH (ref 44–121)
BUN/Creatinine Ratio: 44 — ABNORMAL HIGH (ref 12–28)
BUN: 27 mg/dL (ref 8–27)
Bilirubin Total: 0.2 mg/dL (ref 0.0–1.2)
CO2: 25 mmol/L (ref 20–29)
Calcium: 9.2 mg/dL (ref 8.7–10.3)
Chloride: 106 mmol/L (ref 96–106)
Creatinine, Ser: 0.62 mg/dL (ref 0.57–1.00)
Globulin, Total: 2.5 g/dL (ref 1.5–4.5)
Glucose: 109 mg/dL — ABNORMAL HIGH (ref 70–99)
Potassium: 4.4 mmol/L (ref 3.5–5.2)
Sodium: 143 mmol/L (ref 134–144)
Total Protein: 7 g/dL (ref 6.0–8.5)
eGFR: 89 mL/min/{1.73_m2} (ref >59)

## 2022-10-07 LAB — LIPID PANEL
Chol/HDL Ratio: 3.1 ratio (ref 0.0–4.4)
Cholesterol, Total: 197 mg/dL (ref 100–199)
HDL: 63 mg/dL (ref >39)
LDL Chol Calc (NIH): 113 mg/dL — ABNORMAL HIGH (ref 0–99)
Triglycerides: 119 mg/dL (ref 0–149)
VLDL Cholesterol Cal: 21 mg/dL (ref 5–40)

## 2022-10-07 NOTE — Assessment & Plan Note (Signed)
A voluntary discussion about advance care planning including the explanation and discussion of advance directives was extensively discussed  with the patient for 3 minutes with patient.   Explanation about the health care proxy and Living will was reviewed and packet with forms with explanation of how to fill them out was given.  During this discussion, the patient was able to identify a health care proxy as her son and already has a living will.  Does not wish to make changes at this time.

## 2022-10-07 NOTE — Progress Notes (Signed)
Please let patient know that her lab work looks good.  Cholesterol is a little bit elevated.  I recommend a low fat diet.  No other concerns at this time.  Continue with current medication regimen.  Follow up as discussed.

## 2022-10-08 NOTE — Progress Notes (Signed)
Please let patient know that her knee does show some arthritis. We can get her scheduled for a knee injection to help with the pain.

## 2022-10-12 ENCOUNTER — Encounter: Payer: Self-pay | Admitting: Nurse Practitioner

## 2022-10-12 ENCOUNTER — Ambulatory Visit (INDEPENDENT_AMBULATORY_CARE_PROVIDER_SITE_OTHER): Payer: Medicare Other | Admitting: Nurse Practitioner

## 2022-10-12 VITALS — BP 132/62 | HR 80 | Temp 99.3°F | Wt 129.1 lb

## 2022-10-12 DIAGNOSIS — M170 Bilateral primary osteoarthritis of knee: Secondary | ICD-10-CM

## 2022-10-12 NOTE — Progress Notes (Signed)
BP 132/62   Pulse 80   Temp 99.3 F (37.4 C) (Oral)   Wt 129 lb 1.6 oz (58.6 kg)   SpO2 97%   BMI 20.85 kg/m    Subjective:    Patient ID: Amanda Bruce, female    DOB: Oct 27, 1940, 82 y.o.   MRN: [id number]  HPI: Amanda Bruce is a 82 y.o. female  Chief Complaint  Patient presents with   Knee Pain   KNEE PAIN Duration: weeks Involved knee: right Mechanism of injury: unknown Location:diffuse Onset: sudden Severity: 8/10  Quality:  sharp Frequency: constant Radiation: no Aggravating factors: weight bearing, walking, and movement  Alleviating factors:  tylenol and aspercream and brace  Status: worse Treatments attempted:  aspercream, tylenol and   brace Relief with NSAIDs?:  mild Weakness with weight bearing or walking: yes Sensation of giving way: no Locking: no Popping: no Bruising: no Swelling: no Redness: no Paresthesias/decreased sensation: no Fevers: no   Relevant past medical, surgical, family and social history reviewed and updated as indicated. Interim medical history since our last visit reviewed. Allergies and medications reviewed and updated.  Review of Systems  Musculoskeletal:        Right knee pain    Per HPI unless specifically indicated above     Objective:    BP 132/62   Pulse 80   Temp 99.3 F (37.4 C) (Oral)   Wt 129 lb 1.6 oz (58.6 kg)   SpO2 97%   BMI 20.85 kg/m   Wt Readings from Last 3 Encounters:  10/12/22 129 lb 1.6 oz (58.6 kg)  10/06/22 131 lb 8 oz (59.6 kg)  08/11/22 118 lb (53.5 kg)    Physical Exam Vitals and nursing note reviewed.  Constitutional:      General: She is not in acute distress.    Appearance: Normal appearance. She is normal weight. She is not ill-appearing, toxic-appearing or diaphoretic.  HENT:     Head: Normocephalic.     Right Ear: External ear normal.     Left Ear: External ear normal.     Nose: Nose normal.     Mouth/Throat:     Mouth: Mucous membranes are moist.     Pharynx:  Oropharynx is clear.  Eyes:     General:        Right eye: No discharge.        Left eye: No discharge.     Extraocular Movements: Extraocular movements intact.     Conjunctiva/sclera: Conjunctivae normal.     Pupils: Pupils are equal, round, and reactive to light.  Cardiovascular:     Rate and Rhythm: Normal rate and regular rhythm.     Heart sounds: No murmur heard. Pulmonary:     Effort: Pulmonary effort is normal. No respiratory distress.     Breath sounds: Normal breath sounds. No wheezing or rales.  Musculoskeletal:        General: Swelling and tenderness present. No deformity or signs of injury.     Cervical back: Normal range of motion and neck supple.     Right lower leg: No edema.     Left lower leg: No edema.  Skin:    General: Skin is warm and dry.     Capillary Refill: Capillary refill takes less than 2 seconds.  Neurological:     General: No focal deficit present.     Mental Status: She is alert and oriented to person, place, and time. Mental status is at baseline.  Psychiatric:        Mood and Affect: Mood normal.        Behavior: Behavior normal.        Thought Content: Thought content normal.        Judgment: Judgment normal.     Results for orders placed or performed in visit on 10/06/22  Comp Met (CMET)  Result Value Ref Range   Glucose 109 (H) 70 - 99 mg/dL   BUN 27 8 - 27 mg/dL   Creatinine, Ser 1.61 0.57 - 1.00 mg/dL   eGFR 89 >09 UE/AVW/0.98   BUN/Creatinine Ratio 44 (H) 12 - 28   Sodium 143 134 - 144 mmol/L   Potassium 4.4 3.5 - 5.2 mmol/L   Chloride 106 96 - 106 mmol/L   CO2 25 20 - 29 mmol/L   Calcium 9.2 8.7 - 10.3 mg/dL   Total Protein 7.0 6.0 - 8.5 g/dL   Albumin 4.5 3.7 - 4.7 g/dL   Globulin, Total 2.5 1.5 - 4.5 g/dL   Albumin/Globulin Ratio 1.8 1.2 - 2.2   Bilirubin Total 0.2 0.0 - 1.2 mg/dL   Alkaline Phosphatase 130 (H) 44 - 121 IU/L   AST 22 0 - 40 IU/L   ALT 11 0 - 32 IU/L  Lipid Profile  Result Value Ref Range   Cholesterol,  Total 197 100 - 199 mg/dL   Triglycerides 119 0 - 149 mg/dL   HDL 63 >14 mg/dL   VLDL Cholesterol Cal 21 5 - 40 mg/dL   LDL Chol Calc (NIH) 782 (H) 0 - 99 mg/dL   Chol/HDL Ratio 3.1 0.0 - 4.4 ratio      Assessment & Plan:   Problem List Items Addressed This Visit       Musculoskeletal and Integument   Osteoarthritis of both knees - Primary    See procedure note below.       Procedure: Right  Knee Intraarticular Steroid Injection        Diagnosis:   ICD-10-CM   1. Primary osteoarthritis of both knees  M17.0       Physician: @ Consent:  Risks, benefits, and alternative treatments discussed and all questions were answered.  Patient elected to proceed and verbal consent obtained.  Description: Area prepped and draped using  semi-sterile technique.  Using a anterior/lateral approach, a mixture of 4 cc of  1% lidocaine & 1 cc of Kenalog 40 was injected into knee joint.  A bandage was then placed over the injection site. Complications: none Post Procedure Instructions: Wound care instructions discussed and patient was instructed to keep area clean and dry.  Signs and symptoms of infection discussed, patient agrees to contact the office ASAP should they occur.  Follow Up: Return if symptoms worsen or fail to improve.  Follow up plan: Return if symptoms worsen or fail to improve.

## 2022-10-12 NOTE — Assessment & Plan Note (Signed)
See procedure note below.

## 2022-10-22 ENCOUNTER — Telehealth: Payer: Self-pay

## 2022-10-22 NOTE — Telephone Encounter (Signed)
Pt's son Ron, on [place], given results per notes of Larae Grooms, NP on 10/08/22. Pt's son verbalized understanding. He says that is exactly what his mom told him, but he says sometimes she doesn't tell him everything. So he called to be sure arthritis is all it is. He asked about a knee replacement, advised at this point the OV note says to return if worsen or fail to improve, nothing about an ortho referral. He says she's still hobbling around on it, so he wanted to be sure.     Larae Grooms, NP 10/08/2022 10:47 AM EDT     Please let patient know that her knee does show some arthritis. We can get her scheduled for a knee injection to help with the pain.

## 2022-11-19 ENCOUNTER — Other Ambulatory Visit: Payer: Self-pay | Admitting: Physician Assistant

## 2022-11-19 ENCOUNTER — Other Ambulatory Visit: Payer: Self-pay | Admitting: Nurse Practitioner

## 2022-11-19 DIAGNOSIS — I10 Essential (primary) hypertension: Secondary | ICD-10-CM

## 2022-11-19 MED ORDER — AMLODIPINE BESYLATE 5 MG PO TABS: 5.0000 mg | ORAL_TABLET | Freq: Every day | ORAL | 1 refills | Status: DC

## 2022-11-19 NOTE — Telephone Encounter (Signed)
Requested medications are due for refill today.  yes  Requested medications are on the active medications list.  yes  Last refill. 04/07/2022 #90 1 rf  Future visit scheduled.   yes  Notes to clinic.  Rx written to expired 10/04/2022 - Rx is expired.    Requested Prescriptions  Pending Prescriptions Disp Refills   amLODipine (NORVASC) 5 MG tablet [Pharmacy Med Name: AMLODIPINE BESYLATE 5 MG TAB] 90 tablet 0    Sig: Take 1 tablet (5 mg total) by mouth daily.     Cardiovascular: Calcium Channel Blockers 2 Passed - 11/19/2022  1:56 PM      Passed - Last BP in normal range    BP Readings from Last 1 Encounters:  10/12/22 132/62         Passed - Last Heart Rate in normal range    Pulse Readings from Last 1 Encounters:  10/12/22 80         Passed - Valid encounter within last 6 months    Recent Outpatient Visits           1 month ago Primary osteoarthritis of both knees   [place] [facility] Larae Grooms, NP   1 month ago Encounter for annual wellness exam in Medicare patient   [place] [facility] Larae Grooms, NP   7 months ago Need for influenza vaccination   [place] [facility] Mecum, Oswaldo Conroy, PA-C   8 months ago Primary hypertension   [place] [facility] Mecum, Oswaldo Conroy, PA-C   9 months ago Hematuria, unspecified type   [place] [facility] Mecum, Oswaldo Conroy, PA-C       Future Appointments             In 4 months Larae Grooms, NP [facility]

## 2022-11-19 NOTE — Telephone Encounter (Signed)
Medication Refill - Medication: amLODipine (NORVASC) 5 MG tablet [[id number]]  END   Medication states d/c. Son would like to know does the patient need to continue on this medication. States he will follow up around 4:00p-4:30p for an update  Patient reports that she has 2 pills left  Has the patient contacted their pharmacy? Yes.   (Agent: If no, request that the patient contact the pharmacy for the refill. If patient does not wish to contact the pharmacy document the reason why and proceed with request.) (Agent: If yes, when and what did the pharmacy advise?)  Preferred Pharmacy (with phone number or street name):  [facility] [place], [place] - [address] Phone: [phone]  Fax: [phone]     Has the patient been seen for an appointment in the last year OR does the patient have an upcoming appointment? Yes.    Agent: Please be advised that RX refills may take up to 3 business days. We ask that you follow-up with your pharmacy.

## 2022-11-19 NOTE — Telephone Encounter (Signed)
Routing to provider. RX in chart says expired.

## 2022-11-19 NOTE — Telephone Encounter (Signed)
Rx has been pended by nurse triage for provider review- expired Rx

## 2022-11-19 NOTE — Telephone Encounter (Signed)
Patient needs new RX sent in please.

## 2022-11-19 NOTE — Telephone Encounter (Signed)
Yes.  Patient should continue on the medication.

## 2022-12-22 DIAGNOSIS — D692 Other nonthrombocytopenic purpura: Secondary | ICD-10-CM | POA: Diagnosis not present

## 2022-12-22 DIAGNOSIS — L814 Other melanin hyperpigmentation: Secondary | ICD-10-CM | POA: Diagnosis not present

## 2022-12-22 DIAGNOSIS — D225 Melanocytic nevi of trunk: Secondary | ICD-10-CM | POA: Diagnosis not present

## 2022-12-22 DIAGNOSIS — S80812A Abrasion, left lower leg, initial encounter: Secondary | ICD-10-CM | POA: Diagnosis not present

## 2022-12-22 DIAGNOSIS — D2262 Melanocytic nevi of left upper limb, including shoulder: Secondary | ICD-10-CM | POA: Diagnosis not present

## 2022-12-22 DIAGNOSIS — D2261 Melanocytic nevi of right upper limb, including shoulder: Secondary | ICD-10-CM | POA: Diagnosis not present

## 2022-12-22 DIAGNOSIS — L570 Actinic keratosis: Secondary | ICD-10-CM | POA: Diagnosis not present

## 2022-12-22 DIAGNOSIS — D2272 Melanocytic nevi of left lower limb, including hip: Secondary | ICD-10-CM | POA: Diagnosis not present

## 2022-12-22 DIAGNOSIS — L821 Other seborrheic keratosis: Secondary | ICD-10-CM | POA: Diagnosis not present

## 2022-12-22 DIAGNOSIS — S90511A Abrasion, right ankle, initial encounter: Secondary | ICD-10-CM | POA: Diagnosis not present

## 2022-12-22 DIAGNOSIS — D2271 Melanocytic nevi of right lower limb, including hip: Secondary | ICD-10-CM | POA: Diagnosis not present

## 2023-01-04 DIAGNOSIS — M1711 Unilateral primary osteoarthritis, right knee: Secondary | ICD-10-CM | POA: Diagnosis not present

## 2023-02-24 ENCOUNTER — Other Ambulatory Visit: Payer: Self-pay | Admitting: Nurse Practitioner

## 2023-02-24 DIAGNOSIS — I10 Essential (primary) hypertension: Secondary | ICD-10-CM

## 2023-02-25 ENCOUNTER — Other Ambulatory Visit: Payer: Self-pay | Admitting: Nurse Practitioner

## 2023-02-25 DIAGNOSIS — I10 Essential (primary) hypertension: Secondary | ICD-10-CM

## 2023-02-25 NOTE — Telephone Encounter (Signed)
Requested Prescriptions  Refused Prescriptions Disp Refills   amLODipine (NORVASC) 5 MG tablet [Pharmacy Med Name: AMLODIPINE BESYLATE 5 MG TAB] 90 tablet 0    Sig: Take 1 tablet (5 mg total) by mouth daily.     Cardiovascular: Calcium Channel Blockers 2 Passed - 02/24/2023  9:40 AM      Passed - Last BP in normal range    BP Readings from Last 1 Encounters:  10/12/22 132/62         Passed - Last Heart Rate in normal range    Pulse Readings from Last 1 Encounters:  10/12/22 80         Passed - Valid encounter within last 6 months    Recent Outpatient Visits           4 months ago Primary osteoarthritis of both knees   [place] [facility] Larae Grooms, NP   4 months ago Encounter for annual wellness exam in Medicare patient   [place] [facility] Larae Grooms, NP   10 months ago Need for influenza vaccination   [place] [facility] Mecum, Oswaldo Conroy, PA-C   1 year ago Primary hypertension   [place] [facility] Mecum, Oswaldo Conroy, PA-C   1 year ago Hematuria, unspecified type    [facility] Mecum, Oswaldo Conroy, PA-C       Future Appointments             In 1 month Larae Grooms, NP  [facility], [facility]

## 2023-02-25 NOTE — Telephone Encounter (Signed)
Medication Refill - Medication: amLODipine (NORVASC) 5 MG tablet   Has the patient contacted their pharmacy? Yes.    Preferred Pharmacy (with phone number or street name):  [facility] - [place], [place] - [address] Phone: [phone]  Fax: [phone]     Has the patient been seen for an appointment in the last year OR does the patient have an upcoming appointment? Yes.    Agent: Please be advised that RX refills may take up to 3 business days. We ask that you follow-up with your pharmacy.

## 2023-03-17 ENCOUNTER — Ambulatory Visit: Payer: Medicare Other | Admitting: Urology

## 2023-04-06 NOTE — Progress Notes (Unsigned)
There were no vitals taken for this visit.   Subjective:    Patient ID: Amanda Bruce, female    DOB: June 08, 1941, 82 y.o.   MRN: [id number]  HPI: Amanda Bruce is a 82 y.o. female  No chief complaint on file.  HYPERTENSION/CHF {Blank single:19197::"without","with"} Chronic Kidney Disease Hypertension status: {Blank single:19197::"controlled","uncontrolled","better","worse","exacerbated","stable"}  Satisfied with current treatment? {Blank single:19197::"yes","no"} Duration of hypertension: {Blank single:19197::"chronic","months","years"} BP monitoring frequency:  {Blank single:19197::"not checking","rarely","daily","weekly","monthly","a few times a day","a few times a week","a few times a month"} BP range:  BP medication side effects:  {Blank single:19197::"yes","no"} Medication compliance: {Blank single:19197::"excellent compliance","good compliance","fair compliance","poor compliance"} Previous BP meds:{Blank multiple:19196::"none","amlodipine","amlodipine/benazepril","atenolol","benazepril","benazepril/HCTZ","bisoprolol (bystolic)","carvedilol","chlorthalidone","clonidine","diltiazem","exforge HCT","HCTZ","irbesartan (avapro)","labetalol","lisinopril","lisinopril-HCTZ","losartan (cozaar)","methyldopa","nifedipine","olmesartan (benicar)","olmesartan-HCTZ","quinapril","ramipril","spironalactone","tekturna","valsartan","valsartan-HCTZ","verapamil"} Aspirin: {Blank single:19197::"yes","no"} Recurrent headaches: {Blank single:19197::"yes","no"} Visual changes: {Blank single:19197::"yes","no"} Palpitations: {Blank single:19197::"yes","no"} Dyspnea: {Blank single:19197::"yes","no"} Chest pain: {Blank single:19197::"yes","no"} Lower extremity edema: {Blank single:19197::"yes","no"} Dizzy/lightheaded: {Blank single:19197::"yes","no"}  MEMORY   Relevant past medical, surgical, family and social history reviewed and updated as indicated. Interim medical history since our last visit  reviewed. Allergies and medications reviewed and updated.  Review of Systems  Per HPI unless specifically indicated above     Objective:    There were no vitals taken for this visit.  Wt Readings from Last 3 Encounters:  10/12/22 129 lb 1.6 oz (58.6 kg)  10/06/22 131 lb 8 oz (59.6 kg)  08/11/22 118 lb (53.5 kg)    Physical Exam  Results for orders placed or performed in visit on 10/06/22  Comp Met (CMET)  Result Value Ref Range   Glucose 109 (H) 70 - 99 mg/dL   BUN 27 8 - 27 mg/dL   Creatinine, Ser 4.09 0.57 - 1.00 mg/dL   eGFR 89 >81 XB/JYN/8.29   BUN/Creatinine Ratio 44 (H) 12 - 28   Sodium 143 134 - 144 mmol/L   Potassium 4.4 3.5 - 5.2 mmol/L   Chloride 106 96 - 106 mmol/L   CO2 25 20 - 29 mmol/L   Calcium 9.2 8.7 - 10.3 mg/dL   Total Protein 7.0 6.0 - 8.5 g/dL   Albumin 4.5 3.7 - 4.7 g/dL   Globulin, Total 2.5 1.5 - 4.5 g/dL   Albumin/Globulin Ratio 1.8 1.2 - 2.2   Bilirubin Total 0.2 0.0 - 1.2 mg/dL   Alkaline Phosphatase 130 (H) 44 - 121 IU/L   AST 22 0 - 40 IU/L   ALT 11 0 - 32 IU/L  Lipid Profile  Result Value Ref Range   Cholesterol, Total 197 100 - 199 mg/dL   Triglycerides 562 0 - 149 mg/dL   HDL 63 >13 mg/dL   VLDL Cholesterol Cal 21 5 - 40 mg/dL   LDL Chol Calc (NIH) 086 (H) 0 - 99 mg/dL   Chol/HDL Ratio 3.1 0.0 - 4.4 ratio      Assessment & Plan:   Problem List Items Addressed This Visit       Cardiovascular and Mediastinum   Chronic diastolic CHF (congestive heart failure) (HCC) - Primary (Chronic)   Primary hypertension     Other   Hypercholesteremia   Memory deficit     Follow up plan: No follow-ups on file.

## 2023-04-07 ENCOUNTER — Ambulatory Visit (INDEPENDENT_AMBULATORY_CARE_PROVIDER_SITE_OTHER): Payer: Medicare Other | Admitting: Nurse Practitioner

## 2023-04-07 ENCOUNTER — Telehealth: Payer: Self-pay | Admitting: Nurse Practitioner

## 2023-04-07 ENCOUNTER — Encounter: Payer: Self-pay | Admitting: Nurse Practitioner

## 2023-04-07 VITALS — BP 138/79 | HR 67 | Temp 97.9°F | Wt 130.6 lb

## 2023-04-07 DIAGNOSIS — I5032 Chronic diastolic (congestive) heart failure: Secondary | ICD-10-CM

## 2023-04-07 DIAGNOSIS — Z23 Encounter for immunization: Secondary | ICD-10-CM | POA: Diagnosis not present

## 2023-04-07 DIAGNOSIS — R7309 Other abnormal glucose: Secondary | ICD-10-CM | POA: Diagnosis not present

## 2023-04-07 DIAGNOSIS — E7800 Pure hypercholesterolemia, unspecified: Secondary | ICD-10-CM

## 2023-04-07 DIAGNOSIS — R413 Other amnesia: Secondary | ICD-10-CM

## 2023-04-07 DIAGNOSIS — I10 Essential (primary) hypertension: Secondary | ICD-10-CM

## 2023-04-07 DIAGNOSIS — M170 Bilateral primary osteoarthritis of knee: Secondary | ICD-10-CM

## 2023-04-07 MED ORDER — AMLODIPINE BESYLATE 5 MG PO TABS: 5.0000 mg | ORAL_TABLET | Freq: Every day | ORAL | 1 refills | Status: DC

## 2023-04-07 NOTE — Assessment & Plan Note (Signed)
Endorses worsening right knee pain.  Improved with steroid injection in April.  Requesting injection at visit today.  See procedure note below.

## 2023-04-07 NOTE — Telephone Encounter (Signed)
Called and informed patient that she received a 1% Lidocaine and Kenalog 40 injection in her knee.   She said she thought maybe she had received the flu or covid, I looked into the chart and she hadn't received them.  Pt verbalized understanding.

## 2023-04-07 NOTE — Assessment & Plan Note (Signed)
Labs ordered at visit today.  Will make recommendations based on lab results.   

## 2023-04-07 NOTE — Assessment & Plan Note (Addendum)
Does repeat herself often.  Completed MMSE today = 28/30 which is overall normal  Discussed importance of this with patient as she was very concerned and anxious about her memory.  Recommend continued repeat testing every 6-12 months for monitoring

## 2023-04-07 NOTE — Assessment & Plan Note (Signed)
Chronic.  Controlled.  Continue with current medication regimen of Amlodipine daily.  Recommend checking blood pressure at home and bringing log to next visit.  Refills sent today.  Labs ordered today.  Return to clinic in 6 months for reevaluation.  Call sooner if concerns arise.

## 2023-04-07 NOTE — Assessment & Plan Note (Addendum)
Chronic.  Doing well.  Does not see Cardiology.  Declines referral at this time. Would benefit from SGLT2. ECHO results from 2022 showed LVEF of 55-60%.

## 2023-04-07 NOTE — Telephone Encounter (Signed)
Patient stated he was in the office and got a shot but was not sure what she recvd. Please call patient to f/u on what shot she got.

## 2023-04-08 LAB — COMPREHENSIVE METABOLIC PANEL
ALT: 13 [IU]/L (ref 0–32)
AST: 20 [IU]/L (ref 0–40)
Albumin: 4.8 g/dL — ABNORMAL HIGH (ref 3.7–4.7)
Alkaline Phosphatase: 128 [IU]/L — ABNORMAL HIGH (ref 44–121)
BUN/Creatinine Ratio: 42 — ABNORMAL HIGH (ref 12–28)
BUN: 25 mg/dL (ref 8–27)
Bilirubin Total: 0.5 mg/dL (ref 0.0–1.2)
CO2: 21 mmol/L (ref 20–29)
Calcium: 9.6 mg/dL (ref 8.7–10.3)
Chloride: 100 mmol/L (ref 96–106)
Creatinine, Ser: 0.59 mg/dL (ref 0.57–1.00)
Globulin, Total: 2.5 g/dL (ref 1.5–4.5)
Glucose: 84 mg/dL (ref 70–99)
Potassium: 4 mmol/L (ref 3.5–5.2)
Sodium: 141 mmol/L (ref 134–144)
Total Protein: 7.3 g/dL (ref 6.0–8.5)
eGFR: 90 mL/min/{1.73_m2} (ref >59)

## 2023-04-08 LAB — LIPID PANEL
Chol/HDL Ratio: 3.2 {ratio} (ref 0.0–4.4)
Cholesterol, Total: 209 mg/dL — ABNORMAL HIGH (ref 100–199)
HDL: 66 mg/dL (ref >39)
LDL Chol Calc (NIH): 127 mg/dL — ABNORMAL HIGH (ref 0–99)
Triglycerides: 88 mg/dL (ref 0–149)
VLDL Cholesterol Cal: 16 mg/dL (ref 5–40)

## 2023-04-08 LAB — HEMOGLOBIN A1C
Est. average glucose Bld gHb Est-mCnc: 103 mg/dL
Hgb A1c MFr Bld: 5.2 % (ref 4.8–5.6)

## 2023-04-08 NOTE — Progress Notes (Signed)
Please let patient know that her lab work looks good.  No concerns at this time. Continue with current medication regimen.  Follow up as discussed.

## 2023-06-14 ENCOUNTER — Ambulatory Visit: Payer: Self-pay

## 2023-06-14 NOTE — Telephone Encounter (Signed)
Patient called, left VM to return the call to the office to speak to the NT.   Summary: dizziness   Pt called in has dizziness

## 2023-06-14 NOTE — Telephone Encounter (Signed)
  Chief Complaint: dizziness. lightheaded Symptoms: dizziness comes and goes. Feels lightheaded sitting and standing . Mild can walk has not felt this kind of dizziness before. Reports not drinking adequate amt of water. Does not have BP monitor to check BP.  Frequency: 2 days  Pertinent Negatives: Patient denies chest pain no difficulty breathing no fever no N/T extremities. Disposition: [] ED /[] Urgent Care (no appt availability in office) / [x] Appointment(In office/virtual)/ []  [facility]/ [] Home Care/ [] Refused Recommended Disposition /[] [facility]/ []  Follow-up with PCP Additional Notes:    No available appt with PCP. Scheduled appt tomorrow with another provider       Reason for Disposition  [1] MODERATE dizziness (e.g., interferes with normal activities) AND [2] has NOT been evaluated by doctor (or NP/PA) for this  (Exception: Dizziness caused by heat exposure, sudden standing, or poor fluid intake.)  Answer Assessment - Initial Assessment Questions 1. DESCRIPTION: "Describe your dizziness."     lightheaded 2. LIGHTHEADED: "Do you feel lightheaded?" (e.g., somewhat faint, woozy, weak upon standing)     Like feel "drunk".  3. VERTIGO: "Do you feel like either you or the room is spinning or tilting?" (i.e. vertigo)     no 4. SEVERITY: "How bad is it?"  "Do you feel like you are going to faint?" "Can you stand and walk?"   - MILD: Feels slightly dizzy, but walking normally.   - MODERATE: Feels unsteady when walking, but not falling; interferes with normal activities (e.g., school, work).   - SEVERE: Unable to walk without falling, or requires assistance to walk without falling; feels like passing out now.      Mild  5. ONSET:  "When did the dizziness begin?"     2 days  6. AGGRAVATING FACTORS: "Does anything make it worse?" (e.g., standing, change in head position)     Standing  or changing position too fast  7. HEART RATE: "Can you tell me your heart  rate?" "How many beats in 15 seconds?"  (Note: not all patients can do this)       na 8. CAUSE: "What do you think is causing the dizziness?"     Not sure  9. RECURRENT SYMPTOM: "Have you had dizziness before?" If Yes, ask: "When was the last time?" "What happened that time?"     Not this way  10. OTHER SYMPTOMS: "Do you have any other symptoms?" (e.g., fever, chest pain, vomiting, diarrhea, bleeding)       Lightheaded dizzy only 11. PREGNANCY: "Is there any chance you are pregnant?" "When was your last menstrual period?"       na  Protocols used: Dizziness - Lightheadedness-A-AH

## 2023-06-15 ENCOUNTER — Encounter: Payer: Self-pay | Admitting: Nurse Practitioner

## 2023-06-15 ENCOUNTER — Ambulatory Visit (INDEPENDENT_AMBULATORY_CARE_PROVIDER_SITE_OTHER): Payer: Medicare Other | Admitting: Nurse Practitioner

## 2023-06-15 VITALS — BP 144/82 | HR 78 | Temp 98.4°F | Wt 132.8 lb

## 2023-06-15 DIAGNOSIS — R42 Dizziness and giddiness: Secondary | ICD-10-CM | POA: Diagnosis not present

## 2023-06-15 NOTE — Assessment & Plan Note (Signed)
Acute, reports no history of similar, although on review had similar in 2018.  Suspect more orthostatic changes present, seeing reduction in BP from sitting to standing, plus she has poor water intake.  Overall reassuring neuro exam today with no red flags noted.  Will check labs today.  Have recommended she start wearing compression hose, on during day and off at night. Discussed importance of good water intake at home and recommend she increase this.  Return in 2 weeks, sooner if worsening.

## 2023-06-15 NOTE — Progress Notes (Signed)
BP (!) 144/82 (BP Location: Left Arm, Patient Position: Sitting, Cuff Size: Normal)   Pulse 78   Temp 98.4 F (36.9 C) (Oral)   Wt 132 lb 12.8 oz (60.2 kg)   SpO2 98%   BMI 21.44 kg/m    Subjective:    Patient ID: Amanda Bruce, female    DOB: [birth year], 82 y.o.   MRN: [id number]  HPI: Amanda Bruce is a 82 y.o. female  Chief Complaint  Patient presents with   Dizziness    Patient states she was having a dizzy and lightheaded spell a couple of days ago. States she stood up and got very dizzy so she sat back down. States she has not experienced this since then. Not currently having any dizziness.    DIZZINESS Started 4-5 days ago -- 2 episodes total.  No episodes today.  Has not had similar episodes before per her report, however on review of chart there was some past vertigo.  Went to get up and go to restroom recently, but had to sit back down.  Notices it the most when getting up from sitting down. Takes only Amlodipine for BP.  Lives on own and worries about falling.  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 Does not drink much water at home, drinks lots of coffee.  Denies any falls or near falls.  No red flag symptoms reported. Duration: days Description of symptoms: heavy in the head and light headed Duration of episode: seconds to minutes Dizziness frequency: no history of the same Provoking factors: sitting to standing Aggravating factors: sitting to standing Triggered by rolling over in bed: no Triggered by bending over: no Aggravated by head movement: no Aggravated by exertion, coughing, loud noises: no Recent head injury: no Recent or current viral symptoms: no History of vasovagal episodes: no Nausea: no Vomiting: no Tinnitus: no Hearing loss: no Aural fullness: no Headache: no Photophobia/phonophobia: no Unsteady gait:  only when dizziness started Postural  instability: no Diplopia, dysarthria, dysphagia or weakness: no Related to exertion: no Pallor: no Diaphoresis: no Dyspnea: no Chest pain: no  Orthostatic VS for the past 72 hrs (Last 3 readings):  Orthostatic BP Patient Position BP Location Cuff Size Orthostatic Pulse  06/15/23 1139 118/78 Standing Left Arm Normal --  06/15/23 1130 133/83 Standing Left Arm Normal 93  06/15/23 1129 -- Sitting Left Arm Normal --  06/15/23 1100 145/72 -- -- -- 80     Relevant past medical, surgical, family and social history reviewed and updated as indicated. Interim medical history since our last visit reviewed. Allergies and medications reviewed and updated.  Review of Systems  Constitutional:  Negative for activity change, appetite change, diaphoresis, fatigue and fever.  Respiratory:  Negative for cough, chest tightness, shortness of breath and wheezing.   Cardiovascular:  Negative for chest pain, palpitations and leg swelling.  Gastrointestinal: Negative.   Endocrine: Negative.   Genitourinary: Negative.   Neurological:  Positive for dizziness (with position change) and light-headedness (with position change). Negative for tremors, syncope, facial asymmetry, speech difficulty, weakness, numbness and headaches.  Psychiatric/Behavioral: Negative.      Per HPI unless specifically indicated above     Objective:    BP (!) 144/82 (BP Location: Left Arm, Patient Position: Sitting, Cuff Size: Normal)   Pulse 78   Temp 98.4 F (36.9 C) (Oral)   Wt 132 lb  12.8 oz (60.2 kg)   SpO2 98%   BMI 21.44 kg/m   Wt Readings from Last 3 Encounters:  06/15/23 132 lb 12.8 oz (60.2 kg)  04/07/23 130 lb 9.6 oz (59.2 kg)  10/12/22 129 lb 1.6 oz (58.6 kg)    Physical Exam Vitals and nursing note reviewed.  Constitutional:      General: She is awake. She is not in acute distress.    Appearance: Normal appearance. She is well-developed and well-groomed. She is not ill-appearing or toxic-appearing.  HENT:      Head: Normocephalic.     Right Ear: Hearing, ear canal and external ear normal. No middle ear effusion. There is no impacted cerumen.     Left Ear: Hearing, ear canal and external ear normal.  No middle ear effusion. There is no impacted cerumen.     Nose: Nose normal. No rhinorrhea.     Right Sinus: No maxillary sinus tenderness or frontal sinus tenderness.     Left Sinus: No maxillary sinus tenderness or frontal sinus tenderness.     Mouth/Throat:     Mouth: Mucous membranes are moist.     Pharynx: No pharyngeal swelling, oropharyngeal exudate or posterior oropharyngeal erythema.  Eyes:     General: Lids are normal.        Right eye: No discharge.        Left eye: No discharge.     Conjunctiva/sclera: Conjunctivae normal.     Pupils: Pupils are equal, round, and reactive to light.  Neck:     Thyroid: No thyromegaly.     Vascular: No carotid bruit.  Cardiovascular:     Rate and Rhythm: Normal rate and regular rhythm.     Heart sounds: Normal heart sounds. No murmur heard.    No gallop.  Pulmonary:     Effort: Pulmonary effort is normal. No accessory muscle usage or respiratory distress.     Breath sounds: Normal breath sounds. No decreased breath sounds, wheezing or rhonchi.  Abdominal:     General: Bowel sounds are normal. There is no distension.     Palpations: Abdomen is soft.     Tenderness: There is no abdominal tenderness.  Musculoskeletal:     Cervical back: Normal range of motion and neck supple.     Right lower leg: No edema.     Left lower leg: No edema.  Lymphadenopathy:     Cervical: No cervical adenopathy.  Skin:    General: Skin is warm and dry.  Neurological:     Mental Status: She is alert and oriented to person, place, and time.     Cranial Nerves: Cranial nerves 2-12 are intact.     Sensory: Sensation is intact.     Motor: Motor function is intact. No weakness.     Coordination: Coordination is intact. Romberg sign negative. Finger-Nose-Finger Test and  Heel to [facility] Test normal.     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     Deep Tendon Reflexes: Reflexes are normal and symmetric.     Reflex Scores:      Brachioradialis reflexes are 2+ on the right side and 2+ on the left side.      Patellar reflexes are 2+ on the right side and 2+ on the left side. Psychiatric:        Attention and Perception: Attention normal.        Mood and Affect: Mood normal.        Speech: Speech  normal.        Behavior: Behavior normal. Behavior is cooperative.        Thought Content: Thought content normal.    Results for orders placed or performed in visit on 04/07/23  Comp Met (CMET)   Collection Time: 04/07/23 10:41 AM  Result Value Ref Range   Glucose 84 70 - 99 mg/dL   BUN 25 8 - 27 mg/dL   Creatinine, Ser 7.82 0.57 - 1.00 mg/dL   eGFR 90 >95 AO/ZHY/8.65   BUN/Creatinine Ratio 42 (H) 12 - 28   Sodium 141 134 - 144 mmol/L   Potassium 4.0 3.5 - 5.2 mmol/L   Chloride 100 96 - 106 mmol/L   CO2 21 20 - 29 mmol/L   Calcium 9.6 8.7 - 10.3 mg/dL   Total Protein 7.3 6.0 - 8.5 g/dL   Albumin 4.8 (H) 3.7 - 4.7 g/dL   Globulin, Total 2.5 1.5 - 4.5 g/dL   Bilirubin Total 0.5 0.0 - 1.2 mg/dL   Alkaline Phosphatase 128 (H) 44 - 121 IU/L   AST 20 0 - 40 IU/L   ALT 13 0 - 32 IU/L  Lipid Profile   Collection Time: 04/07/23 10:41 AM  Result Value Ref Range   Cholesterol, Total 209 (H) 100 - 199 mg/dL   Triglycerides 88 0 - 149 mg/dL   HDL 66 >78 mg/dL   VLDL Cholesterol Cal 16 5 - 40 mg/dL   LDL Chol Calc (NIH) 469 (H) 0 - 99 mg/dL   Chol/HDL Ratio 3.2 0.0 - 4.4 ratio  HgB A1c   Collection Time: 04/07/23 10:41 AM  Result Value Ref Range   Hgb A1c MFr Bld 5.2 4.8 - 5.6 %   Est. average glucose Bld gHb Est-mCnc 103 mg/dL      Assessment & Plan:   Problem List Items Addressed This Visit       Other   Vertigo - Primary   Acute, reports no history of similar, although on review had similar in 2018.  Suspect more orthostatic changes  present, seeing reduction in BP from sitting to standing, plus she has poor water intake.  Overall reassuring neuro exam today with no red flags noted.  Will check labs today.  Have recommended she start wearing compression hose, on during day and off at night. Discussed importance of good water intake at home and recommend she increase this.  Return in 2 weeks, sooner if worsening.      Relevant Orders   CBC with Differential/Platelet   Comprehensive metabolic panel   Other Visit Diagnoses       Hypomagnesemia       History of reported, will check today.   Relevant Orders   Magnesium        Follow up plan: Return in about 1 week (around 06/22/2023) for Vertigo.

## 2023-06-15 NOTE — Patient Instructions (Signed)
Drink lots of water and wear compression hose - on during day and off at night.  Vertigo Vertigo is the feeling that you or the things around you are moving when they are not. This feeling can come and go at any time. Vertigo often goes away on its own. This condition can be dangerous if it happens when you are doing activities like driving or working with machines. Your doctor will do tests to find the cause of your vertigo. These tests will also help your doctor decide on the best treatment for you. Follow these instructions at home: Eating and drinking     Drink enough fluid to keep your pee (urine) pale yellow. Do not drink alcohol. Activity Return to your normal activities when your doctor says that it is safe. In the morning, first sit up on the side of the bed. When you feel okay, stand slowly while you hold onto something until you know that your balance is fine. Move slowly. Avoid sudden body or head movements or certain positions, as told by your doctor. Use a cane if you have trouble standing or walking. Sit down right away if you feel dizzy. Avoid doing any tasks or activities that can cause danger to you or others if you get dizzy. Avoid bending down if you feel dizzy. Place items in your home so that they are easy for you to reach without bending or leaning over. Do not drive or use machinery if you feel dizzy. General instructions Take over-the-counter and prescription medicines only as told by your doctor. Keep all follow-up visits. Contact a doctor if: Your medicine does not help your vertigo. Your problems get worse or you have new symptoms. You have a fever. You feel like you may vomit (nauseous), or this feeling gets worse. You start to vomit. Your family or friends see changes in how you act. You lose feeling (have numbness) in part of your body. You feel prickling and tingling in a part of your body. Get help right away if: You are always dizzy. You  faint. You get very bad headaches. You get a stiff neck. Bright light starts to bother you. You have trouble moving or talking. You feel weak in your hands, arms, or legs. You have changes in your hearing or in how you see (vision). These symptoms may be an emergency. Get help right away. Call your local emergency services (911 in the [place] Do not wait to see if the symptoms will go away. Do not drive yourself to the hospital. Summary Vertigo is the feeling that you or the things around you are moving when they are not. Your doctor will do tests to find the cause of your vertigo. You may be told to avoid some tasks, positions, or movements. Contact a doctor if your medicine is not helping, or if you have a fever, new symptoms, or a change in how you act. Get help right away if you get very bad headaches, or if you have changes in how you speak, hear, or see. This information is not intended to replace advice given to you by your health care provider. Make sure you discuss any questions you have with your health care provider. Document Revised: 05/14/2020 Document Reviewed: 05/14/2020 Elsevier Patient Education  2024 [employer].

## 2023-06-16 ENCOUNTER — Telehealth: Payer: Self-pay | Admitting: Nurse Practitioner

## 2023-06-16 LAB — COMPREHENSIVE METABOLIC PANEL
ALT: 11 [IU]/L (ref 0–32)
AST: 16 [IU]/L (ref 0–40)
Albumin: 4.6 g/dL (ref 3.7–4.7)
Alkaline Phosphatase: 123 [IU]/L — ABNORMAL HIGH (ref 44–121)
BUN/Creatinine Ratio: 35 — ABNORMAL HIGH (ref 12–28)
BUN: 20 mg/dL (ref 8–27)
Bilirubin Total: 0.4 mg/dL (ref 0.0–1.2)
CO2: 23 mmol/L (ref 20–29)
Calcium: 9.4 mg/dL (ref 8.7–10.3)
Chloride: 103 mmol/L (ref 96–106)
Creatinine, Ser: 0.57 mg/dL (ref 0.57–1.00)
Globulin, Total: 2.2 g/dL (ref 1.5–4.5)
Glucose: 93 mg/dL (ref 70–99)
Potassium: 3.9 mmol/L (ref 3.5–5.2)
Sodium: 142 mmol/L (ref 134–144)
Total Protein: 6.8 g/dL (ref 6.0–8.5)
eGFR: 91 mL/min/{1.73_m2} (ref >59)

## 2023-06-16 LAB — CBC WITH DIFFERENTIAL/PLATELET
Basophils Absolute: 0 10*3/uL (ref 0.0–0.2)
Basos: 1 %
EOS (ABSOLUTE): 0.1 10*3/uL (ref 0.0–0.4)
Eos: 1 %
Hematocrit: 37.6 % (ref 34.0–46.6)
Hemoglobin: 12.5 g/dL (ref 11.1–15.9)
Immature Grans (Abs): 0 10*3/uL (ref 0.0–0.1)
Immature Granulocytes: 0 %
Lymphocytes Absolute: 0.9 10*3/uL (ref 0.7–3.1)
Lymphs: 16 %
MCH: 31.7 pg (ref 26.6–33.0)
MCHC: 33.2 g/dL (ref 31.5–35.7)
MCV: 95 fL (ref 79–97)
Monocytes Absolute: 0.5 10*3/uL (ref 0.1–0.9)
Monocytes: 9 %
Neutrophils Absolute: 4.1 10*3/uL (ref 1.4–7.0)
Neutrophils: 73 %
Platelets: 303 10*3/uL (ref 150–450)
RBC: 3.94 x10E6/uL (ref 3.77–5.28)
RDW: 13 % (ref 11.7–15.4)
WBC: 5.5 10*3/uL (ref 3.4–10.8)

## 2023-06-16 LAB — MAGNESIUM: Magnesium: 2.2 mg/dL (ref 1.6–2.3)

## 2023-06-16 NOTE — Progress Notes (Signed)
Please let Kriste Basque know her labs have returned and are overall nice and stable with no concerns.  Continue all current medication and as we discussed please drink more water/less caffeine + wear compression hose.  Any questions? Keep being amazing!!  Thank you for allowing me to participate in your care.  I appreciate you. Kindest regards, Tonnie Stillman

## 2023-06-16 NOTE — Telephone Encounter (Signed)
Pt given lab results per notes of J. Cannady DNP  on 06/16/23. Pt verbalized understanding. Pt stated she does not remember having BW drawn. Pt stated they could have drawn while in room waiting for provider. Pt stated she is used to going back to lab.

## 2023-07-01 NOTE — Patient Instructions (Addendum)
 Get an Alert 1 monitor to wear around neck daily in case of emergency -- have son help you order this.  Vertigo Vertigo is the feeling that you or the things around you are moving when they are not. This feeling can come and go at any time. Vertigo often goes away on its own. This condition can be dangerous if it happens when you are doing activities like driving or working with machines. Your doctor will do tests to find the cause of your vertigo. These tests will also help your doctor decide on the best treatment for you. Follow these instructions at home: Eating and drinking     Drink enough fluid to keep your pee (urine) pale yellow. Do not drink alcohol. Activity Return to your normal activities when your doctor says that it is safe. In the morning, first sit up on the side of the bed. When you feel okay, stand slowly while you hold onto something until you know that your balance is fine. Move slowly. Avoid sudden body or head movements or certain positions, as told by your doctor. Use a cane if you have trouble standing or walking. Sit down right away if you feel dizzy. Avoid doing any tasks or activities that can cause danger to you or others if you get dizzy. Avoid bending down if you feel dizzy. Place items in your home so that they are easy for you to reach without bending or leaning over. Do not drive or use machinery if you feel dizzy. General instructions Take over-the-counter and prescription medicines only as told by your doctor. Keep all follow-up visits. Contact a doctor if: Your medicine does not help your vertigo. Your problems get worse or you have new symptoms. You have a fever. You feel like you may vomit (nauseous), or this feeling gets worse. You start to vomit. Your family or friends see changes in how you act. You lose feeling (have numbness) in part of your body. You feel prickling and tingling in a part of your body. Get help right away if: You are always  dizzy. You faint. You get very bad headaches. You get a stiff neck. Bright light starts to bother you. You have trouble moving or talking. You feel weak in your hands, arms, or legs. You have changes in your hearing or in how you see (vision). These symptoms may be an emergency. Get help right away. Call your local emergency services (911 in the [place] Do not wait to see if the symptoms will go away. Do not drive yourself to the hospital. Summary Vertigo is the feeling that you or the things around you are moving when they are not. Your doctor will do tests to find the cause of your vertigo. You may be told to avoid some tasks, positions, or movements. Contact a doctor if your medicine is not helping, or if you have a fever, new symptoms, or a change in how you act. Get help right away if you get very bad headaches, or if you have changes in how you speak, hear, or see. This information is not intended to replace advice given to you by your health care provider. Make sure you discuss any questions you have with your health care provider. Document Revised: 05/14/2020 Document Reviewed: 05/14/2020 Elsevier Patient Education  2024 [employer].

## 2023-07-06 ENCOUNTER — Ambulatory Visit (INDEPENDENT_AMBULATORY_CARE_PROVIDER_SITE_OTHER): Payer: Medicare Other | Admitting: Nurse Practitioner

## 2023-07-06 ENCOUNTER — Encounter: Payer: Self-pay | Admitting: Nurse Practitioner

## 2023-07-06 VITALS — BP 137/73 | HR 98 | Temp 97.9°F | Wt 132.0 lb

## 2023-07-06 DIAGNOSIS — R413 Other amnesia: Secondary | ICD-10-CM | POA: Diagnosis not present

## 2023-07-06 DIAGNOSIS — R42 Dizziness and giddiness: Secondary | ICD-10-CM

## 2023-07-06 NOTE — Assessment & Plan Note (Signed)
 Refer to vertigo plan of care.

## 2023-07-06 NOTE — Progress Notes (Signed)
 BP 137/73   Pulse 98   Temp 97.9 F (36.6 C) (Oral)   Wt 132 lb (59.9 kg)   SpO2 97%   BMI 21.32 kg/m    Subjective:    Patient ID: Amanda Bruce, female    DOB: [birth year], 83 y.o.   MRN: [id number]  HPI: Amanda Bruce is a 83 y.o. female  Chief Complaint  Patient presents with   Dizziness    Patient states she has still been experiencing dizziness sometimes in the mornings. States it is not every morning.    DIZZINESS Follow-up for dizziness today, initially seen 06/15/23 with recommendation to wear compression hose due to mild lowering in BP with position changes noted.  Last visit she reported having two episodes and today reports continues to have some dizziness 2-3 times a week only in the morning when getting out of bed.  Changed routine to waiting to get out of bed vs jumping out of bed which helps.  No episodes today. Has not had similar episodes before per her report, however on review of chart there was some past vertigo. Notices it the most when getting up from sitting down. Takes only Amlodipine  for BP.  Lives on own and worries about falling. Does not drink much water at home, drinks lots of coffee.  Denies any falls or near falls.  No red flag symptoms reported. Duration: days Description of symptoms: light headed with standing up Duration of episode: seconds to minutes Dizziness frequency: no history of the same Provoking factors: sitting to standing Aggravating factors: sitting to standing Triggered by rolling over in bed: no Triggered by bending over: no Aggravated by head movement: no Aggravated by exertion, coughing, loud noises: no Recent head injury: no Recent or current viral symptoms: no History of vasovagal episodes: no Nausea: no Vomiting: no Tinnitus: no Hearing loss: no Aural fullness: no Headache: no Photophobia/phonophobia: no Unsteady gait: no Postural instability: no Diplopia, dysarthria, dysphagia or weakness: no Related to  exertion: no Pallor: no Diaphoresis: no Dyspnea: no Chest pain: no   Relevant past medical, surgical, family and social history reviewed and updated as indicated. Interim medical history since our last visit reviewed. Allergies and medications reviewed and updated.  Review of Systems  Constitutional:  Negative for activity change, appetite change, diaphoresis, fatigue and fever.  Respiratory:  Negative for cough, chest tightness, shortness of breath and wheezing.   Cardiovascular:  Negative for chest pain, palpitations and leg swelling.  Gastrointestinal: Negative.   Endocrine: Negative.   Genitourinary: Negative.   Neurological:  Positive for dizziness (with position change) and light-headedness (with position change). Negative for tremors, syncope, facial asymmetry, speech difficulty, weakness, numbness and headaches.  Psychiatric/Behavioral: Negative.      Per HPI unless specifically indicated above     Objective:    BP 137/73   Pulse 98   Temp 97.9 F (36.6 C) (Oral)   Wt 132 lb (59.9 kg)   SpO2 97%   BMI 21.32 kg/m   Wt Readings from Last 3 Encounters:  07/06/23 132 lb (59.9 kg)  06/15/23 132 lb 12.8 oz (60.2 kg)  04/07/23 130 lb 9.6 oz (59.2 kg)    Physical Exam Vitals and nursing note reviewed.  Constitutional:      General: She is awake. She is not in acute distress.    Appearance: Normal appearance. She is well-developed and well-groomed. She is not ill-appearing or toxic-appearing.  HENT:     Head: Normocephalic.  Right Ear: Hearing, ear canal and external ear normal. No middle ear effusion. There is no impacted cerumen.     Left Ear: Hearing, ear canal and external ear normal.  No middle ear effusion. There is no impacted cerumen.     Nose: Nose normal. No rhinorrhea.     Right Sinus: No maxillary sinus tenderness or frontal sinus tenderness.     Left Sinus: No maxillary sinus tenderness or frontal sinus tenderness.     Mouth/Throat:     Mouth: Mucous  membranes are moist.     Pharynx: No pharyngeal swelling, oropharyngeal exudate or posterior oropharyngeal erythema.  Eyes:     General: Lids are normal.        Right eye: No discharge.        Left eye: No discharge.     Conjunctiva/sclera: Conjunctivae normal.     Pupils: Pupils are equal, round, and reactive to light.  Neck:     Thyroid : No thyromegaly.     Vascular: No carotid bruit.  Cardiovascular:     Rate and Rhythm: Normal rate and regular rhythm.     Heart sounds: Normal heart sounds. No murmur heard.    No gallop.  Pulmonary:     Effort: Pulmonary effort is normal. No accessory muscle usage or respiratory distress.     Breath sounds: Normal breath sounds. No decreased breath sounds, wheezing or rhonchi.  Abdominal:     General: Bowel sounds are normal. There is no distension.     Palpations: Abdomen is soft.     Tenderness: There is no abdominal tenderness.  Musculoskeletal:     Cervical back: Normal range of motion and neck supple.     Right lower leg: No edema.     Left lower leg: No edema.  Lymphadenopathy:     Cervical: No cervical adenopathy.  Skin:    General: Skin is warm and dry.  Neurological:     Mental Status: She is alert and oriented to person, place, and time.     Cranial Nerves: Cranial nerves 2-12 are intact.     Sensory: Sensation is intact.     Motor: Motor function is intact. No weakness.     Coordination: Coordination is intact. Romberg sign negative. Finger-Nose-Finger Test and Heel to [facility] Test normal.     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     Deep Tendon Reflexes: Reflexes are normal and symmetric.     Reflex Scores:      Brachioradialis reflexes are 2+ on the right side and 2+ on the left side.      Patellar reflexes are 2+ on the right side and 2+ on the left side. Psychiatric:        Attention and Perception: Attention normal.        Mood and Affect: Mood normal.        Speech: Speech normal.        Behavior: Behavior  normal. Behavior is cooperative.        Thought Content: Thought content normal.    Results for orders placed or performed in visit on 06/15/23  CBC with Differential/Platelet   Collection Time: 06/15/23 11:50 AM  Result Value Ref Range   WBC 5.5 3.4 - 10.8 x10E3/uL   RBC 3.94 3.77 - 5.28 x10E6/uL   Hemoglobin 12.5 11.1 - 15.9 g/dL   Hematocrit 62.3 65.9 - 46.6 %   MCV 95 79 - 97 fL   MCH 31.7 26.6 -  33.0 pg   MCHC 33.2 31.5 - 35.7 g/dL   RDW 86.9 88.2 - 84.5 %   Platelets 303 150 - 450 x10E3/uL   Neutrophils 73 Not Estab. %   Lymphs 16 Not Estab. %   Monocytes 9 Not Estab. %   Eos 1 Not Estab. %   Basos 1 Not Estab. %   Neutrophils Absolute 4.1 1.4 - 7.0 x10E3/uL   Lymphocytes Absolute 0.9 0.7 - 3.1 x10E3/uL   Monocytes Absolute 0.5 0.1 - 0.9 x10E3/uL   EOS (ABSOLUTE) 0.1 0.0 - 0.4 x10E3/uL   Basophils Absolute 0.0 0.0 - 0.2 x10E3/uL   Immature Granulocytes 0 Not Estab. %   Immature Grans (Abs) 0.0 0.0 - 0.1 x10E3/uL  Comprehensive metabolic panel   Collection Time: 06/15/23 11:50 AM  Result Value Ref Range   Glucose 93 70 - 99 mg/dL   BUN 20 8 - 27 mg/dL   Creatinine, Ser 9.42 0.57 - 1.00 mg/dL   eGFR 91 >40 fO/fpw/8.26   BUN/Creatinine Ratio 35 (H) 12 - 28   Sodium 142 134 - 144 mmol/L   Potassium 3.9 3.5 - 5.2 mmol/L   Chloride 103 96 - 106 mmol/L   CO2 23 20 - 29 mmol/L   Calcium 9.4 8.7 - 10.3 mg/dL   Total Protein 6.8 6.0 - 8.5 g/dL   Albumin 4.6 3.7 - 4.7 g/dL   Globulin, Total 2.2 1.5 - 4.5 g/dL   Bilirubin Total 0.4 0.0 - 1.2 mg/dL   Alkaline Phosphatase 123 (H) 44 - 121 IU/L   AST 16 0 - 40 IU/L   ALT 11 0 - 32 IU/L  Magnesium    Collection Time: 06/15/23 11:50 AM  Result Value Ref Range   Magnesium  2.2 1.6 - 2.3 mg/dL      Assessment & Plan:   Problem List Items Addressed This Visit       Other   Memory deficit   Refer to vertigo plan of care.      Relevant Orders   CT HEAD WO CONTRAST ( )   Vertigo - Primary   Ongoing, when gets out of  bed in morning.  Reports no history of similar, although on review had similar in 2018.  Suspect more orthostatic changes present, based on exam and past orthostatics. Overall reassuring neuro exam today with no red flags noted.  Recent labs reassuring.  Will obtain CT head for further assessment due to ongoing vertigo and age + her underlying memory changes at baseline.  Recommended she start wearing compression hose, on during day and off at night. Discussed importance of good water intake at home and recommend she increase this.  Recommend she obtain Alert 1, get her son to help her order, to wear at home so she has something to use if has acute event in house and needs to contact EMS.      Relevant Orders   CT HEAD WO CONTRAST ( )      Follow up plan: Return in about 4 weeks (around 08/03/2023) for Vertigo.

## 2023-07-06 NOTE — Assessment & Plan Note (Signed)
 Ongoing, when gets out of bed in morning.  Reports no history of similar, although on review had similar in 2018.  Suspect more orthostatic changes present, based on exam and past orthostatics. Overall reassuring neuro exam today with no red flags noted.  Recent labs reassuring.  Will obtain CT head for further assessment due to ongoing vertigo and age + her underlying memory changes at baseline.  Recommended she start wearing compression hose, on during day and off at night. Discussed importance of good water intake at home and recommend she increase this.  Recommend she obtain Alert 1, get her son to help her order, to wear at home so she has something to use if has acute event in house and needs to contact EMS.

## 2023-08-04 ENCOUNTER — Ambulatory Visit (INDEPENDENT_AMBULATORY_CARE_PROVIDER_SITE_OTHER): Payer: Medicare Other | Admitting: Nurse Practitioner

## 2023-08-04 ENCOUNTER — Encounter: Payer: Self-pay | Admitting: Nurse Practitioner

## 2023-08-04 VITALS — BP 133/76 | HR 79 | Ht 64.96 in | Wt 130.8 lb

## 2023-08-04 DIAGNOSIS — R42 Dizziness and giddiness: Secondary | ICD-10-CM | POA: Diagnosis not present

## 2023-08-04 NOTE — Progress Notes (Signed)
 BP 133/76 (BP Location: Left Arm, Patient Position: Sitting, Cuff Size: Large)   Pulse 79   Ht 5' 4.96 (1.65 m)   Wt 130 lb 12.8 oz (59.3 kg)   SpO2 95%   BMI 21.79 kg/m    Subjective:    Patient ID: Amanda Bruce, female    DOB: [birth year], 83 y.o.   MRN: [id number]  HPI: Amanda Bruce is a 83 y.o. female  Chief Complaint  Patient presents with   1 monthh follow up   Dizziness    Happens 2-3x per week, last episode was about 2 days ago, Can last a few minutes   DIZZINESS She had her CT scheduled about 2 weeks ago but it snowed that day so she didn't get it done.  Happens 2-3 times per week.  She sits on the edge for a couple of minutes until her dizziness resolves.  No change in her symptoms from previous visit.    Follow-up for dizziness today, initially seen 06/15/23 and 1/825 with recommendation to wear compression hose due to mild lowering in BP with position changes noted.  Last visit she reported having two episodes and today reports continues to have some dizziness 2-3 times a week only in the morning when getting out of bed.  Changed routine to waiting to get out of bed vs jumping out of bed which helps.  No episodes today. Has not had similar episodes before per her report, however on review of chart there was some past vertigo. Notices it the most when getting up from sitting down. Takes only Amlodipine  for BP.  Lives on own and worries about falling. Does not drink much water at home, drinks lots of coffee.  Denies any falls or near falls.  No red flag symptoms reported. Duration: days Description of symptoms: light headed with standing up Duration of episode: seconds to minutes Dizziness frequency: no history of the same Provoking factors: sitting to standing Aggravating factors: sitting to standing Triggered by rolling over in bed: no Triggered by bending over: no Aggravated by head movement: no Aggravated by exertion, coughing, loud noises: no Recent  head injury: no Recent or current viral symptoms: no History of vasovagal episodes: no Nausea: no Vomiting: no Tinnitus: no Hearing loss: no Aural fullness: no Headache: no Photophobia/phonophobia: no Unsteady gait: no Postural instability: no Diplopia, dysarthria, dysphagia or weakness: no Related to exertion: no Pallor: no Diaphoresis: no Dyspnea: no Chest pain: no   Relevant past medical, surgical, family and social history reviewed and updated as indicated. Interim medical history since our last visit reviewed. Allergies and medications reviewed and updated.  Review of Systems  Constitutional:  Negative for activity change, appetite change, diaphoresis, fatigue and fever.  Respiratory:  Negative for cough, chest tightness, shortness of breath and wheezing.   Cardiovascular:  Negative for chest pain, palpitations and leg swelling.  Gastrointestinal: Negative.   Endocrine: Negative.   Genitourinary: Negative.   Neurological:  Positive for dizziness (with position change) and light-headedness (with position change). Negative for tremors, syncope, facial asymmetry, speech difficulty, weakness, numbness and headaches.  Psychiatric/Behavioral: Negative.      Per HPI unless specifically indicated above     Objective:    BP 133/76 (BP Location: Left Arm, Patient Position: Sitting, Cuff Size: Large)   Pulse 79   Ht 5' 4.96 (1.65 m)   Wt 130 lb 12.8 oz (59.3 kg)   SpO2 95%   BMI 21.79 kg/m   Wt Readings from Last  3 Encounters:  08/04/23 130 lb 12.8 oz (59.3 kg)  07/06/23 132 lb (59.9 kg)  06/15/23 132 lb 12.8 oz (60.2 kg)    Physical Exam Vitals and nursing note reviewed.  Constitutional:      General: She is awake. She is not in acute distress.    Appearance: Normal appearance. She is well-developed and well-groomed. She is not ill-appearing or toxic-appearing.  HENT:     Head: Normocephalic.     Right Ear: Hearing, ear canal and external ear normal. No middle ear  effusion. There is no impacted cerumen.     Left Ear: Hearing, ear canal and external ear normal.  No middle ear effusion. There is no impacted cerumen.     Nose: Nose normal. No rhinorrhea.     Right Sinus: No maxillary sinus tenderness or frontal sinus tenderness.     Left Sinus: No maxillary sinus tenderness or frontal sinus tenderness.     Mouth/Throat:     Mouth: Mucous membranes are moist.     Pharynx: No pharyngeal swelling, oropharyngeal exudate or posterior oropharyngeal erythema.  Eyes:     General: Lids are normal.        Right eye: No discharge.        Left eye: No discharge.     Conjunctiva/sclera: Conjunctivae normal.     Pupils: Pupils are equal, round, and reactive to light.  Neck:     Thyroid : No thyromegaly.     Vascular: No carotid bruit.  Cardiovascular:     Rate and Rhythm: Normal rate and regular rhythm.     Heart sounds: Normal heart sounds. No murmur heard.    No gallop.  Pulmonary:     Effort: Pulmonary effort is normal. No accessory muscle usage or respiratory distress.     Breath sounds: Normal breath sounds. No decreased breath sounds, wheezing or rhonchi.  Abdominal:     General: Bowel sounds are normal. There is no distension.     Palpations: Abdomen is soft.     Tenderness: There is no abdominal tenderness.  Musculoskeletal:     Cervical back: Normal range of motion and neck supple.     Right lower leg: No edema.     Left lower leg: No edema.  Lymphadenopathy:     Cervical: No cervical adenopathy.  Skin:    General: Skin is warm and dry.  Neurological:     Mental Status: She is alert and oriented to person, place, and time.     Cranial Nerves: Cranial nerves 2-12 are intact.     Sensory: Sensation is intact.     Motor: Motor function is intact. No weakness.     Coordination: Coordination is intact. Romberg sign negative. Finger-Nose-Finger Test and Heel to [facility] Test normal.     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  Psychiatric:         Attention and Perception: Attention normal.        Mood and Affect: Mood normal.        Speech: Speech normal.        Behavior: Behavior normal. Behavior is cooperative.        Thought Content: Thought content normal.    Results for orders placed or performed in visit on 06/15/23  CBC with Differential/Platelet   Collection Time: 06/15/23 11:50 AM  Result Value Ref Range   WBC 5.5 3.4 - 10.8 x10E3/uL   RBC 3.94 3.77 - 5.28 x10E6/uL   Hemoglobin 12.5 11.1 -  15.9 g/dL   Hematocrit 62.3 65.9 - 46.6 %   MCV 95 79 - 97 fL   MCH 31.7 26.6 - 33.0 pg   MCHC 33.2 31.5 - 35.7 g/dL   RDW 86.9 88.2 - 84.5 %   Platelets 303 150 - 450 x10E3/uL   Neutrophils 73 Not Estab. %   Lymphs 16 Not Estab. %   Monocytes 9 Not Estab. %   Eos 1 Not Estab. %   Basos 1 Not Estab. %   Neutrophils Absolute 4.1 1.4 - 7.0 x10E3/uL   Lymphocytes Absolute 0.9 0.7 - 3.1 x10E3/uL   Monocytes Absolute 0.5 0.1 - 0.9 x10E3/uL   EOS (ABSOLUTE) 0.1 0.0 - 0.4 x10E3/uL   Basophils Absolute 0.0 0.0 - 0.2 x10E3/uL   Immature Granulocytes 0 Not Estab. %   Immature Grans (Abs) 0.0 0.0 - 0.1 x10E3/uL  Comprehensive metabolic panel   Collection Time: 06/15/23 11:50 AM  Result Value Ref Range   Glucose 93 70 - 99 mg/dL   BUN 20 8 - 27 mg/dL   Creatinine, Ser 9.42 0.57 - 1.00 mg/dL   eGFR 91 >40 fO/fpw/8.26   BUN/Creatinine Ratio 35 (H) 12 - 28   Sodium 142 134 - 144 mmol/L   Potassium 3.9 3.5 - 5.2 mmol/L   Chloride 103 96 - 106 mmol/L   CO2 23 20 - 29 mmol/L   Calcium 9.4 8.7 - 10.3 mg/dL   Total Protein 6.8 6.0 - 8.5 g/dL   Albumin 4.6 3.7 - 4.7 g/dL   Globulin, Total 2.2 1.5 - 4.5 g/dL   Bilirubin Total 0.4 0.0 - 1.2 mg/dL   Alkaline Phosphatase 123 (H) 44 - 121 IU/L   AST 16 0 - 40 IU/L   ALT 11 0 - 32 IU/L  Magnesium    Collection Time: 06/15/23 11:50 AM  Result Value Ref Range   Magnesium  2.2 1.6 - 2.3 mg/dL      Assessment & Plan:   Problem List Items Addressed This Visit       Other    Vertigo - Primary   Exam remains reassuring.  Referral coordinator is working to get patient rescheduled for CT.  Will follow up once results are back.  No change in symptoms per patient.  Only occurs in the morning then able to go about her day.  Follow up in 6 weeks.  Call sooner if concerns arise.           Follow up plan: Return in about 6 weeks (around 09/15/2023) for Dizziness.

## 2023-08-04 NOTE — Assessment & Plan Note (Signed)
 Exam remains reassuring.  Referral coordinator is working to get patient rescheduled for CT.  Will follow up once results are back.  No change in symptoms per patient.  Only occurs in the morning then able to go about her day.  Follow up in 6 weeks.  Call sooner if concerns arise.

## 2023-08-11 ENCOUNTER — Ambulatory Visit
Admission: RE | Admit: 2023-08-11 | Discharge: 2023-08-11 | Disposition: A | Discharge status: Home / Self Care | Payer: Medicare Other | Source: Ambulatory Visit | Attending: Nurse Practitioner | Admitting: Nurse Practitioner

## 2023-08-11 DIAGNOSIS — R413 Other amnesia: Secondary | ICD-10-CM | POA: Insufficient documentation

## 2023-08-11 DIAGNOSIS — I6782 Cerebral ischemia: Secondary | ICD-10-CM | POA: Diagnosis not present

## 2023-08-11 DIAGNOSIS — R42 Dizziness and giddiness: Secondary | ICD-10-CM | POA: Insufficient documentation

## 2023-08-16 ENCOUNTER — Ambulatory Visit: Payer: Self-pay

## 2023-08-16 NOTE — Telephone Encounter (Signed)
  Chief Complaint: Hearing loss Symptoms: Pt has reduced hearing Frequency: several days Pertinent Negatives: Patient denies pain Disposition: [] ED /[] Urgent Care (no appt availability in office) / [] Appointment(In office/virtual)/ []  [place] [facility]/ [] Home Care/ [x] Refused Recommended Disposition /[] [place] [facility]/ []  Follow-up with PCP Additional Notes: Returned call to pt's son Ron. Ron states that pt reports decreased hearing. Unclear if this is both ears or just one. Ron states that the TV was on very loudly today when he called her. Ron called pt back a few hours later, at that time pt told Ron that she had cleaned out her ears and she could hear a little better. Ron will go to pharmacy and get drops to help remove ear wax. Ron did not want to make an appt at this time d/t upcoming weather events expected.  He will call back later to an OV or if there are changes.  Ron was also interested in results from recent CT. Results not yet available.    Summary: Ears clogged, hearing issues   The son of the patient called in stating he speaks with his mother everyday and she has been complaining of ear issues for several days now. She says she feels like she is talking out of a drum like she feels like she is inside of a drum. She says her ears are really stopped up. She has had the television on extra loud to be able to hear anything. She does not complain of pain but yet he is concerned about her ears and hearing. Please assist patient further as the son is on the Dpr list and if possible please call him as she has issues hearing and understanding.     Reason for Disposition  ALL other adults with decreased hearing that has gradually decreased  Answer Assessment - Initial Assessment Questions 1. DESCRIPTION: "What type of hearing problem are you having? Describe it for me." (e.g., complete hearing loss, partial loss)     Ears are stopped up, and cannot hear 2. LOCATION: "One or  both ears?" If one, ask: "Which ear?"     both 3. SEVERITY: "Can you hear anything?" If Yes, ask: "What can you hear?" (e.g., ticking watch, whisper, talking)   - MILD:  Difficulty hearing soft speech, quiet library sounds, or speech from a distance or over background noise.   - MODERATE: Difficulty hearing normal speech even at closed distances.   - SEVERE: Unable to hear most normal conversation and talking; only able to hear loud sounds such as an alarm clock.     Severe 4. ONSET: "When did this begin?" "Did it start suddenly or come on gradually?"     Past few days 5. PATTERN: "Does this come and go, or has it been constant since it started?"     constant 6. PAIN: "Is there any pain in your ear(s)?"  (Scale 1-10; or mild, moderate, severe)   - NONE (0): no pain   - MILD (1-3): doesn't interfere with normal activities    - MODERATE (4-7): interferes with normal activities or awakens from sleep    - SEVERE (8-10): excruciating pain, unable to do any normal activities      none 7. CAUSE: "What do you think is causing this hearing problem?"     Unsure 8. OTHER SYMPTOMS: "Do you have any other symptoms?" (e.g., dizziness, ringing in ears)     no  Protocols used: Hearing Loss or Change-A-AH

## 2023-08-29 NOTE — Progress Notes (Signed)
 Good morning, please let Amanda Bruce know her imaging has returned and no acute findings.  There is some age related volume loss, reduction in size of brain.  Also some chronic small-vessel disease, common finding with aging.  A small amount of fluid in the sinus area.  Overall stable imaging.   Keep being amazing!!  Thank you for allowing me to participate in your care.  I appreciate you. Kindest regards, Shambria Camerer

## 2023-09-14 NOTE — Progress Notes (Unsigned)
 There were no vitals taken for this visit.   Subjective:    Patient ID: Amanda Bruce, female    DOB: 07-28-1940, 83 y.o.   MRN: [id number]  HPI: Amanda Bruce is a 83 y.o. female  No chief complaint on file.  DIZZINESS Follow-up for dizziness today, initially seen 06/15/23 with recommendation to wear compression hose due to mild lowering in BP with position changes noted.  Last visit she reported having two episodes and today reports continues to have some dizziness 2-3 times a week only in the morning when getting out of bed.  Changed routine to waiting to get out of bed vs jumping out of bed which helps.  No episodes today. Has not had similar episodes before per her report, however on review of chart there was some past vertigo. Notices it the most when getting up from sitting down. Takes only Amlodipine for BP.  Lives on own and worries about falling. Does not drink much water at home, drinks lots of coffee.  Denies any falls or near falls.  No red flag symptoms reported. Duration: days Description of symptoms: light headed with standing up Duration of episode: seconds to minutes Dizziness frequency: no history of the same Provoking factors: sitting to standing Aggravating factors: sitting to standing Triggered by rolling over in bed: no Triggered by bending over: no Aggravated by head movement: no Aggravated by exertion, coughing, loud noises: no Recent head injury: no Recent or current viral symptoms: no History of vasovagal episodes: no Nausea: no Vomiting: no Tinnitus: no Hearing loss: no Aural fullness: no Headache: no Photophobia/phonophobia: no Unsteady gait: no Postural instability: no Diplopia, dysarthria, dysphagia or weakness: no Related to exertion: no Pallor: no Diaphoresis: no Dyspnea: no Chest pain: no   Relevant past medical, surgical, family and social history reviewed and updated as indicated. Interim medical history since our last visit  reviewed. Allergies and medications reviewed and updated.  Review of Systems  Constitutional:  Negative for activity change, appetite change, diaphoresis, fatigue and fever.  Respiratory:  Negative for cough, chest tightness, shortness of breath and wheezing.   Cardiovascular:  Negative for chest pain, palpitations and leg swelling.  Gastrointestinal: Negative.   Endocrine: Negative.   Genitourinary: Negative.   Neurological:  Positive for dizziness (with position change) and light-headedness (with position change). Negative for tremors, syncope, facial asymmetry, speech difficulty, weakness, numbness and headaches.  Psychiatric/Behavioral: Negative.      Per HPI unless specifically indicated above     Objective:    There were no vitals taken for this visit.  Wt Readings from Last 3 Encounters:  08/04/23 130 lb 12.8 oz (59.3 kg)  07/06/23 132 lb (59.9 kg)  06/15/23 132 lb 12.8 oz (60.2 kg)    Physical Exam Vitals and nursing note reviewed.  Constitutional:      General: She is awake. She is not in acute distress.    Appearance: Normal appearance. She is well-developed and well-groomed. She is not ill-appearing or toxic-appearing.  HENT:     Head: Normocephalic.     Right Ear: Hearing, ear canal and external ear normal. No middle ear effusion. There is no impacted cerumen.     Left Ear: Hearing, ear canal and external ear normal.  No middle ear effusion. There is no impacted cerumen.     Nose: Nose normal. No rhinorrhea.     Right Sinus: No maxillary sinus tenderness or frontal sinus tenderness.     Left Sinus: No maxillary sinus  tenderness or frontal sinus tenderness.     Mouth/Throat:     Mouth: Mucous membranes are moist.     Pharynx: No pharyngeal swelling, oropharyngeal exudate or posterior oropharyngeal erythema.  Eyes:     General: Lids are normal.        Right eye: No discharge.        Left eye: No discharge.     Conjunctiva/sclera: Conjunctivae normal.     Pupils:  Pupils are equal, round, and reactive to light.  Neck:     Thyroid: No thyromegaly.     Vascular: No carotid bruit.  Cardiovascular:     Rate and Rhythm: Normal rate and regular rhythm.     Heart sounds: Normal heart sounds. No murmur heard.    No gallop.  Pulmonary:     Effort: Pulmonary effort is normal. No accessory muscle usage or respiratory distress.     Breath sounds: Normal breath sounds. No decreased breath sounds, wheezing or rhonchi.  Abdominal:     General: Bowel sounds are normal. There is no distension.     Palpations: Abdomen is soft.     Tenderness: There is no abdominal tenderness.  Musculoskeletal:     Cervical back: Normal range of motion and neck supple.     Right lower leg: No edema.     Left lower leg: No edema.  Lymphadenopathy:     Cervical: No cervical adenopathy.  Skin:    General: Skin is warm and dry.  Neurological:     Mental Status: She is alert and oriented to person, place, and time.     Cranial Nerves: Cranial nerves 2-12 are intact.     Sensory: Sensation is intact.     Motor: Motor function is intact. No weakness.     Coordination: Coordination is intact. Romberg sign negative. Finger-Nose-Finger Test and Heel to [facility] Test normal.     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     Deep Tendon Reflexes: Reflexes are normal and symmetric.     Reflex Scores:      Brachioradialis reflexes are 2+ on the right side and 2+ on the left side.      Patellar reflexes are 2+ on the right side and 2+ on the left side. Psychiatric:        Attention and Perception: Attention normal.        Mood and Affect: Mood normal.        Speech: Speech normal.        Behavior: Behavior normal. Behavior is cooperative.        Thought Content: Thought content normal.   Results for orders placed or performed in visit on 06/15/23  CBC with Differential/Platelet   Collection Time: 06/15/23 11:50 AM  Result Value Ref Range   WBC 5.5 3.4 - 10.8 x10E3/uL   RBC  3.94 3.77 - 5.28 x10E6/uL   Hemoglobin 12.5 11.1 - 15.9 g/dL   Hematocrit 16.1 09.6 - 46.6 %   MCV 95 79 - 97 fL   MCH 31.7 26.6 - 33.0 pg   MCHC 33.2 31.5 - 35.7 g/dL   RDW 04.5 40.9 - 81.1 %   Platelets 303 150 - 450 x10E3/uL   Neutrophils 73 Not Estab. %   Lymphs 16 Not Estab. %   Monocytes 9 Not Estab. %   Eos 1 Not Estab. %   Basos 1 Not Estab. %   Neutrophils Absolute 4.1 1.4 - 7.0 x10E3/uL   Lymphocytes Absolute  0.9 0.7 - 3.1 x10E3/uL   Monocytes Absolute 0.5 0.1 - 0.9 x10E3/uL   EOS (ABSOLUTE) 0.1 0.0 - 0.4 x10E3/uL   Basophils Absolute 0.0 0.0 - 0.2 x10E3/uL   Immature Granulocytes 0 Not Estab. %   Immature Grans (Abs) 0.0 0.0 - 0.1 x10E3/uL  Comprehensive metabolic panel   Collection Time: 06/15/23 11:50 AM  Result Value Ref Range   Glucose 93 70 - 99 mg/dL   BUN 20 8 - 27 mg/dL   Creatinine, Ser 1.61 0.57 - 1.00 mg/dL   eGFR 91 >09 UE/AVW/0.98   BUN/Creatinine Ratio 35 (H) 12 - 28   Sodium 142 134 - 144 mmol/L   Potassium 3.9 3.5 - 5.2 mmol/L   Chloride 103 96 - 106 mmol/L   CO2 23 20 - 29 mmol/L   Calcium 9.4 8.7 - 10.3 mg/dL   Total Protein 6.8 6.0 - 8.5 g/dL   Albumin 4.6 3.7 - 4.7 g/dL   Globulin, Total 2.2 1.5 - 4.5 g/dL   Bilirubin Total 0.4 0.0 - 1.2 mg/dL   Alkaline Phosphatase 123 (H) 44 - 121 IU/L   AST 16 0 - 40 IU/L   ALT 11 0 - 32 IU/L  Magnesium   Collection Time: 06/15/23 11:50 AM  Result Value Ref Range   Magnesium 2.2 1.6 - 2.3 mg/dL      Assessment & Plan:   Problem List Items Addressed This Visit   None      Follow up plan: No follow-ups on file.

## 2023-09-15 ENCOUNTER — Ambulatory Visit: Payer: Medicare Other | Admitting: Nurse Practitioner

## 2023-09-15 ENCOUNTER — Encounter: Payer: Self-pay | Admitting: Nurse Practitioner

## 2023-09-15 VITALS — BP 139/72 | HR 80 | Temp 98.6°F | Resp 17 | Ht 64.96 in | Wt 132.4 lb

## 2023-09-15 DIAGNOSIS — R42 Dizziness and giddiness: Secondary | ICD-10-CM | POA: Diagnosis not present

## 2023-09-15 DIAGNOSIS — M170 Bilateral primary osteoarthritis of knee: Secondary | ICD-10-CM

## 2023-09-15 NOTE — Assessment & Plan Note (Signed)
 Symptoms improved.  Discussed disease process during visit.  Follow up if not improved.  CT scan reviewed.

## 2023-10-06 ENCOUNTER — Ambulatory Visit: Payer: Self-pay | Admitting: Nurse Practitioner

## 2023-10-06 NOTE — Progress Notes (Deleted)
 There were no vitals taken for this visit.   Subjective:    Patient ID: Amanda Bruce, female    DOB: March 20, 1941, 83 y.o.   MRN: [id number]  HPI: Amanda Bruce is a 83 y.o. female  No chief complaint on file.  HYPERTENSION/CHF without Chronic Kidney Disease Hypertension status: controlled  Satisfied with current treatment? yes Duration of hypertension: years BP monitoring frequency:  not checking BP range:  BP medication side effects:  no Medication compliance: excellent compliance Previous BP meds:amlodipine Aspirin: no Recurrent headaches: no Visual changes: no Palpitations: no Dyspnea: no Chest pain: no Lower extremity edema: no Dizzy/lightheaded: no  MEMORY Patient states she feels like her memory is pretty good.  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      04/07/2023    1:33 PM 02/24/2022   10:20 AM 02/24/2022    9:47 AM  MMSE - Mini Mental State Exam  Not completed:   Refused  Orientation to time 5 5   Orientation to Place 5 5   Registration 3 2   Attention/ Calculation 5 4   Recall 3 0   Language- name 2 objects 0 2   Language- repeat 1 1   Language- follow 3 step command 3 3   Language- read & follow direction 1 1   Write a sentence 1 1   Copy design 1 1   Total score 28 25      KNEE PAIN Last steroid injection was helpful for about 6 months.  Starting to feel more pain now.  She is having to decrease her dancing due to the pain.  Duration: weeks Involved knee: right Mechanism of injury: unknown Location:diffuse Onset: sudden Severity: 8/10  Quality:  sharp Frequency: constant Radiation: no Aggravating factors: weight bearing, walking, and movement  Alleviating factors:  tylenol and aspercream and brace  Status: worse Treatments attempted:  aspercream, tylenol and   brace Relief with NSAIDs?:  mild Weakness with weight bearing or walking:  yes Sensation of giving way: no Locking: no Popping: no Bruising: no Swelling: no Redness: no Paresthesias/decreased sensation: no Fevers: no  Relevant past medical, surgical, family and social history reviewed and updated as indicated. Interim medical history since our last visit reviewed. Allergies and medications reviewed and updated.  Review of Systems  Eyes:  Negative for visual disturbance.  Respiratory:  Negative for cough, chest tightness and shortness of breath.   Cardiovascular:  Negative for chest pain, palpitations and leg swelling.  Musculoskeletal:        Right knee pain  Neurological:  Negative for dizziness and headaches.    Per HPI unless specifically indicated above     Objective:    There were no vitals taken for this visit.  Wt Readings from Last 3 Encounters:  09/15/23 132 lb 6.4 oz (60.1 kg)  08/04/23 130 lb 12.8 oz (59.3 kg)  07/06/23 132 lb (59.9 kg)    Physical Exam Vitals and nursing note reviewed.  Constitutional:      General: She is not in acute distress.    Appearance: Normal appearance. She is normal weight. She is not ill-appearing, toxic-appearing or diaphoretic.  HENT:     Head: Normocephalic.     Right Ear: External ear normal.     Left Ear: External ear normal.     Nose: Nose normal.     Mouth/Throat:  Mouth: Mucous membranes are moist.     Pharynx: Oropharynx is clear.  Eyes:     General:        Right eye: No discharge.        Left eye: No discharge.     Extraocular Movements: Extraocular movements intact.     Conjunctiva/sclera: Conjunctivae normal.     Pupils: Pupils are equal, round, and reactive to light.  Cardiovascular:     Rate and Rhythm: Normal rate and regular rhythm.     Heart sounds: No murmur heard. Pulmonary:     Effort: Pulmonary effort is normal. No respiratory distress.     Breath sounds: Normal breath sounds. No wheezing or rales.  Musculoskeletal:     Cervical back: Normal range of motion and neck  supple.     Comments: Limping with right knee  Skin:    General: Skin is warm and dry.     Capillary Refill: Capillary refill takes less than 2 seconds.  Neurological:     General: No focal deficit present.     Mental Status: She is alert and oriented to person, place, and time. Mental status is at baseline.  Psychiatric:        Mood and Affect: Mood normal.        Behavior: Behavior normal.        Thought Content: Thought content normal.        Judgment: Judgment normal.    Results for orders placed or performed in visit on 06/15/23  CBC with Differential/Platelet   Collection Time: 06/15/23 11:50 AM  Result Value Ref Range   WBC 5.5 3.4 - 10.8 x10E3/uL   RBC 3.94 3.77 - 5.28 x10E6/uL   Hemoglobin 12.5 11.1 - 15.9 g/dL   Hematocrit 16.1 09.6 - 46.6 %   MCV 95 79 - 97 fL   MCH 31.7 26.6 - 33.0 pg   MCHC 33.2 31.5 - 35.7 g/dL   RDW 04.5 40.9 - 81.1 %   Platelets 303 150 - 450 x10E3/uL   Neutrophils 73 Not Estab. %   Lymphs 16 Not Estab. %   Monocytes 9 Not Estab. %   Eos 1 Not Estab. %   Basos 1 Not Estab. %   Neutrophils Absolute 4.1 1.4 - 7.0 x10E3/uL   Lymphocytes Absolute 0.9 0.7 - 3.1 x10E3/uL   Monocytes Absolute 0.5 0.1 - 0.9 x10E3/uL   EOS (ABSOLUTE) 0.1 0.0 - 0.4 x10E3/uL   Basophils Absolute 0.0 0.0 - 0.2 x10E3/uL   Immature Granulocytes 0 Not Estab. %   Immature Grans (Abs) 0.0 0.0 - 0.1 x10E3/uL  Comprehensive metabolic panel   Collection Time: 06/15/23 11:50 AM  Result Value Ref Range   Glucose 93 70 - 99 mg/dL   BUN 20 8 - 27 mg/dL   Creatinine, Ser 9.14 0.57 - 1.00 mg/dL   eGFR 91 >78 GN/FAO/1.30   BUN/Creatinine Ratio 35 (H) 12 - 28   Sodium 142 134 - 144 mmol/L   Potassium 3.9 3.5 - 5.2 mmol/L   Chloride 103 96 - 106 mmol/L   CO2 23 20 - 29 mmol/L   Calcium 9.4 8.7 - 10.3 mg/dL   Total Protein 6.8 6.0 - 8.5 g/dL   Albumin 4.6 3.7 - 4.7 g/dL   Globulin, Total 2.2 1.5 - 4.5 g/dL   Bilirubin Total 0.4 0.0 - 1.2 mg/dL   Alkaline Phosphatase 123 (H)  44 - 121 IU/L   AST 16 0 - 40 IU/L   ALT 11  0 - 32 IU/L  Magnesium   Collection Time: 06/15/23 11:50 AM  Result Value Ref Range   Magnesium 2.2 1.6 - 2.3 mg/dL      Assessment & Plan:   Problem List Items Addressed This Visit       Cardiovascular and Mediastinum   Chronic diastolic CHF (congestive heart failure) (HCC) - Primary (Chronic)   Primary hypertension     Other   Hypercholesteremia  Procedure: Right  Knee Intraarticular Steroid Injection        Diagnosis:   ICD-10-CM   1. Chronic diastolic CHF (congestive heart failure) (HCC)  I50.32     2. Primary hypertension  I10     3. Hypercholesteremia  E78.00       Physician: @ENCPROV @ Consent:  Risks, benefits, and alternative treatments discussed and all questions were answered.  Patient elected to proceed and verbal consent obtained.  Description: Area prepped and draped using  semi-sterile technique.  Using a anterior/lateral approach, a mixture of 4 cc of  1% lidocaine & 1 cc of Kenalog 40 was injected into knee joint.  A bandage was then placed over the injection site. Complications: none Post Procedure Instructions: Wound care instructions discussed and patient was instructed to keep area clean and dry.  Signs and symptoms of infection discussed, patient agrees to contact the office ASAP should they occur.   Follow Up: No follow-ups on file.    Follow up plan: No follow-ups on file.

## 2023-12-19 ENCOUNTER — Other Ambulatory Visit: Payer: Self-pay | Admitting: Nurse Practitioner

## 2023-12-19 DIAGNOSIS — I10 Essential (primary) hypertension: Secondary | ICD-10-CM

## 2023-12-20 ENCOUNTER — Telehealth: Payer: Self-pay | Admitting: Nurse Practitioner

## 2023-12-20 DIAGNOSIS — M1711 Unilateral primary osteoarthritis, right knee: Secondary | ICD-10-CM | POA: Diagnosis not present

## 2023-12-20 DIAGNOSIS — I10 Essential (primary) hypertension: Secondary | ICD-10-CM

## 2023-12-20 NOTE — Telephone Encounter (Unsigned)
 Copied from CRM [id number]. Topic: Clinical - Medication Refill >> Dec 20, 2023  2:59 PM Nathanel BROCKS wrote: Medication: amLODipine  (NORVASC ) 5 MG tablet  Has the patient contacted their pharmacy? Yes  This is the patient's preferred pharmacy:  [facility] - [place], [place] - [address] [place] [address] Phone: [phone] Fax: [phone]  Is this the correct pharmacy for this prescription? Yes If no, delete pharmacy and type the correct one.   Has the prescription been filled recently? No  Is the patient out of the medication? Yes  Has the patient been seen for an appointment in the last year OR does the patient have an upcoming appointment? Yes  Can we respond through MyChart? No  Agent: Please be advised that Rx refills may take up to 3 business days. We ask that you follow-up with your pharmacy.

## 2023-12-20 NOTE — Telephone Encounter (Signed)
 Patient is overdue for an appointment. Please call to schedule and then route to provider for refill.

## 2023-12-20 NOTE — Telephone Encounter (Signed)
 Copied from CRM [id number]. Topic: Clinical - Prescription Issue >> Dec 20, 2023  2:47 PM Tiffini S wrote: Reason for CRM: Patient states that she is completely out of  amLODipine  (NORVASC ) 5 MG tablet , take the last tablet yesterday and need the prescription refilled asap. Patient states that her last pill bottle states no refills. Please advise patient with concerns about refills at [phone].

## 2023-12-20 NOTE — Telephone Encounter (Signed)
 Requested Prescriptions  Pending Prescriptions Disp Refills   amLODipine  (NORVASC ) 5 MG tablet [Pharmacy Med Name: AMLODIPINE  BESYLATE 5 MG TAB] 90 tablet 0    Sig: Take 1 tablet (5 mg total) by mouth daily.     Cardiovascular: Calcium Channel Blockers 2 Passed - 12/20/2023  4:56 PM      Passed - Last BP in normal range    BP Readings from Last 1 Encounters:  09/15/23 139/72         Passed - Last Heart Rate in normal range    Pulse Readings from Last 1 Encounters:  09/15/23 80         Passed - Valid encounter within last 6 months    Recent Outpatient Visits           3 months ago Primary osteoarthritis of both knees   [place] [facility] Melvin Pao, NP   4 months ago Vertigo   [place] [facility] Melvin Pao, NP

## 2023-12-21 NOTE — Telephone Encounter (Signed)
 Called patient to get her scheduled, no answer. Left a vm

## 2023-12-22 NOTE — Telephone Encounter (Signed)
 Duplicate request, refilled 12/21/23.  Requested Prescriptions  Pending Prescriptions Disp Refills   amLODipine  (NORVASC ) 5 MG tablet 90 tablet 1    Sig: Take 1 tablet (5 mg total) by mouth daily.     Cardiovascular: Calcium Channel Blockers 2 Passed - 12/22/2023 11:28 AM      Passed - Last BP in normal range    BP Readings from Last 1 Encounters:  09/15/23 139/72         Passed - Last Heart Rate in normal range    Pulse Readings from Last 1 Encounters:  09/15/23 80         Passed - Valid encounter within last 6 months    Recent Outpatient Visits           3 months ago Primary osteoarthritis of both knees   [place] [facility] Melvin Pao, NP   4 months ago Vertigo   [place] [facility] Melvin Pao, NP

## 2023-12-28 DIAGNOSIS — D2271 Melanocytic nevi of right lower limb, including hip: Secondary | ICD-10-CM | POA: Diagnosis not present

## 2023-12-28 DIAGNOSIS — D2261 Melanocytic nevi of right upper limb, including shoulder: Secondary | ICD-10-CM | POA: Diagnosis not present

## 2023-12-28 DIAGNOSIS — D2262 Melanocytic nevi of left upper limb, including shoulder: Secondary | ICD-10-CM | POA: Diagnosis not present

## 2023-12-28 DIAGNOSIS — S80812A Abrasion, left lower leg, initial encounter: Secondary | ICD-10-CM | POA: Diagnosis not present

## 2023-12-28 DIAGNOSIS — D2272 Melanocytic nevi of left lower limb, including hip: Secondary | ICD-10-CM | POA: Diagnosis not present

## 2023-12-28 DIAGNOSIS — D225 Melanocytic nevi of trunk: Secondary | ICD-10-CM | POA: Diagnosis not present

## 2023-12-28 DIAGNOSIS — L814 Other melanin hyperpigmentation: Secondary | ICD-10-CM | POA: Diagnosis not present

## 2023-12-28 DIAGNOSIS — S80811A Abrasion, right lower leg, initial encounter: Secondary | ICD-10-CM | POA: Diagnosis not present

## 2024-01-03 NOTE — Telephone Encounter (Signed)
 2nd attempt to call patient left vm for patient to call office and schedule appt.

## 2024-01-05 MED ORDER — AMLODIPINE BESYLATE 5 MG PO TABS: 5.0000 mg | ORAL_TABLET | Freq: Every day | ORAL | 0 refills | Status: DC

## 2024-01-05 NOTE — Addendum Note (Signed)
 Addended by: MELVIN PAO on: 01/05/2024 11:55 AM   Modules accepted: Orders

## 2024-01-05 NOTE — Telephone Encounter (Signed)
 Refill sent to the pharmacy

## 2024-01-05 NOTE — Telephone Encounter (Signed)
 Scheduled for 7/24 at 940am

## 2024-01-19 ENCOUNTER — Ambulatory Visit
Admission: RE | Admit: 2024-01-19 | Discharge: 2024-01-19 | Disposition: A | Discharge status: Home / Self Care | Source: Ambulatory Visit | Attending: Nurse Practitioner

## 2024-01-19 ENCOUNTER — Ambulatory Visit: Admitting: Nurse Practitioner

## 2024-01-19 ENCOUNTER — Ambulatory Visit
Admission: RE | Admit: 2024-01-19 | Discharge: 2024-01-19 | Disposition: A | Discharge status: Home / Self Care | Attending: Nurse Practitioner | Admitting: Nurse Practitioner

## 2024-01-19 ENCOUNTER — Encounter: Payer: Self-pay | Admitting: Nurse Practitioner

## 2024-01-19 VITALS — BP 121/74 | HR 84 | Temp 98.1°F | Ht 65.0 in | Wt 130.0 lb

## 2024-01-19 DIAGNOSIS — M25561 Pain in right knee: Secondary | ICD-10-CM

## 2024-01-19 DIAGNOSIS — M1611 Unilateral primary osteoarthritis, right hip: Secondary | ICD-10-CM | POA: Diagnosis not present

## 2024-01-19 DIAGNOSIS — M545 Low back pain, unspecified: Secondary | ICD-10-CM

## 2024-01-19 DIAGNOSIS — E7800 Pure hypercholesterolemia, unspecified: Secondary | ICD-10-CM | POA: Diagnosis not present

## 2024-01-19 DIAGNOSIS — M1711 Unilateral primary osteoarthritis, right knee: Secondary | ICD-10-CM | POA: Diagnosis not present

## 2024-01-19 DIAGNOSIS — Z0000 Encounter for general adult medical examination without abnormal findings: Secondary | ICD-10-CM | POA: Diagnosis not present

## 2024-01-19 DIAGNOSIS — I10 Essential (primary) hypertension: Secondary | ICD-10-CM

## 2024-01-19 DIAGNOSIS — M8580 Other specified disorders of bone density and structure, unspecified site: Secondary | ICD-10-CM | POA: Diagnosis not present

## 2024-01-19 DIAGNOSIS — I5032 Chronic diastolic (congestive) heart failure: Secondary | ICD-10-CM | POA: Diagnosis not present

## 2024-01-19 DIAGNOSIS — M533 Sacrococcygeal disorders, not elsewhere classified: Secondary | ICD-10-CM | POA: Diagnosis not present

## 2024-01-19 NOTE — Progress Notes (Signed)
 BP 121/74   Pulse 84   Temp 98.1 F (36.7 C) (Oral)   Ht 5' 5 (1.651 m)   Wt 130 lb (59 kg)   SpO2 97%   BMI 21.63 kg/m    Subjective:    Patient ID: Amanda Bruce, female    DOB: September 14, 1940, 83 y.o.   MRN: [id number]  HPI: Amanda Bruce is a 83 y.o. female  Chief Complaint  Patient presents with   Hyperlipidemia   Hypertension   Medicare Wellness   HYPERTENSION/CHF without Chronic Kidney Disease Hypertension status: controlled  Satisfied with current treatment? yes Duration of hypertension: years BP monitoring frequency:  not checking BP range:  BP medication side effects:  no Medication compliance: excellent compliance Previous BP meds:amlodipine  Aspirin: no Recurrent headaches: no Visual changes: no Palpitations: no Dyspnea: no Chest pain: no Lower extremity edema: no Dizzy/lightheaded: no  MEMORY Patient states she feels like her memory is pretty good.  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 She doesn't think she is as sharp as she used to be.       04/07/2023    1:33 PM 02/24/2022   10:20 AM 02/24/2022    9:47 AM  MMSE - Mini Mental State Exam  Not completed:   Refused  Orientation to time 5 5   Orientation to Place 5 5   Registration 3 2   Attention/ Calculation 5 4   Recall 3 0   Language- name 2 objects 0 2   Language- repeat 1 1   Language- follow 3 step command 3 3   Language- read & follow direction 1 1   Write a sentence 1 1   Copy design 1 1   Total score 28 25      KNEE PAIN Patient states she fell off her lawnmower yesterday.  She reached for something and fell off and his her tail bone.  She is also having some pain in her right knee.  She is taking tylenol  for both.  Duration: weeks Involved knee: right Mechanism of injury: unknown Location:diffuse Onset: sudden Severity: 8/10  Quality:  sharp Frequency: constant Radiation:  no Aggravating factors: weight bearing, walking, and movement  Alleviating factors:  tylenol  and aspercream and brace  Status: worse Treatments attempted:  aspercream, tylenol  and   brace Relief with NSAIDs?:  mild Weakness with weight bearing or walking: yes Sensation of giving way: no Locking: no Popping: no Bruising: no Swelling: no Redness: no Paresthesias/decreased sensation: no Fevers: no  Relevant past medical, surgical, family and social history reviewed and updated as indicated. Interim medical history since our last visit reviewed. Allergies and medications reviewed and updated.  Review of Systems  Eyes:  Negative for visual disturbance.  Respiratory:  Negative for cough, chest tightness and shortness of breath.   Cardiovascular:  Negative for chest pain, palpitations and leg swelling.  Musculoskeletal:        Right knee pain  Neurological:  Negative for dizziness and headaches.    Per HPI unless specifically indicated above     Objective:    BP 121/74   Pulse 84   Temp 98.1 F (36.7 C) (Oral)   Ht 5' 5 (1.651 m)   Wt 130 lb (59 kg)   SpO2 97%   BMI 21.63 kg/m   Wt Readings from Last 3 Encounters:  01/19/24 130 lb (59 kg)  09/15/23 132  lb 6.4 oz (60.1 kg)  08/04/23 130 lb 12.8 oz (59.3 kg)    Physical Exam Vitals and nursing note reviewed.  Constitutional:      General: She is not in acute distress.    Appearance: Normal appearance. She is normal weight. She is not ill-appearing, toxic-appearing or diaphoretic.  HENT:     Head: Normocephalic.     Right Ear: External ear normal.     Left Ear: External ear normal.     Nose: Nose normal.     Mouth/Throat:     Mouth: Mucous membranes are moist.     Pharynx: Oropharynx is clear.  Eyes:     General:        Right eye: No discharge.        Left eye: No discharge.     Extraocular Movements: Extraocular movements intact.     Conjunctiva/sclera: Conjunctivae normal.     Pupils: Pupils are equal,  round, and reactive to light.  Cardiovascular:     Rate and Rhythm: Normal rate and regular rhythm.     Heart sounds: No murmur heard. Pulmonary:     Effort: Pulmonary effort is normal. No respiratory distress.     Breath sounds: Normal breath sounds. No wheezing or rales.  Musculoskeletal:     Cervical back: Normal range of motion and neck supple.     Comments: Limping with right knee  Skin:    General: Skin is warm and dry.     Capillary Refill: Capillary refill takes less than 2 seconds.  Neurological:     General: No focal deficit present.     Mental Status: She is alert and oriented to person, place, and time. Mental status is at baseline.  Psychiatric:        Mood and Affect: Mood normal.        Behavior: Behavior normal.        Thought Content: Thought content normal.        Judgment: Judgment normal.     Results for orders placed or performed in visit on 06/15/23  CBC with Differential/Platelet   Collection Time: 06/15/23 11:50 AM  Result Value Ref Range   WBC 5.5 3.4 - 10.8 x10E3/uL   RBC 3.94 3.77 - 5.28 x10E6/uL   Hemoglobin 12.5 11.1 - 15.9 g/dL   Hematocrit 62.3 65.9 - 46.6 %   MCV 95 79 - 97 fL   MCH 31.7 26.6 - 33.0 pg   MCHC 33.2 31.5 - 35.7 g/dL   RDW 86.9 88.2 - 84.5 %   Platelets 303 150 - 450 x10E3/uL   Neutrophils 73 Not Estab. %   Lymphs 16 Not Estab. %   Monocytes 9 Not Estab. %   Eos 1 Not Estab. %   Basos 1 Not Estab. %   Neutrophils Absolute 4.1 1.4 - 7.0 x10E3/uL   Lymphocytes Absolute 0.9 0.7 - 3.1 x10E3/uL   Monocytes Absolute 0.5 0.1 - 0.9 x10E3/uL   EOS (ABSOLUTE) 0.1 0.0 - 0.4 x10E3/uL   Basophils Absolute 0.0 0.0 - 0.2 x10E3/uL   Immature Granulocytes 0 Not Estab. %   Immature Grans (Abs) 0.0 0.0 - 0.1 x10E3/uL  Comprehensive metabolic panel   Collection Time: 06/15/23 11:50 AM  Result Value Ref Range   Glucose 93 70 - 99 mg/dL   BUN 20 8 - 27 mg/dL   Creatinine, Ser 9.42 0.57 - 1.00 mg/dL   eGFR 91 >40 fO/fpw/8.26    BUN/Creatinine Ratio 35 (H) 12 - 28  Sodium 142 134 - 144 mmol/L   Potassium 3.9 3.5 - 5.2 mmol/L   Chloride 103 96 - 106 mmol/L   CO2 23 20 - 29 mmol/L   Calcium 9.4 8.7 - 10.3 mg/dL   Total Protein 6.8 6.0 - 8.5 g/dL   Albumin 4.6 3.7 - 4.7 g/dL   Globulin, Total 2.2 1.5 - 4.5 g/dL   Bilirubin Total 0.4 0.0 - 1.2 mg/dL   Alkaline Phosphatase 123 (H) 44 - 121 IU/L   AST 16 0 - 40 IU/L   ALT 11 0 - 32 IU/L  Magnesium    Collection Time: 06/15/23 11:50 AM  Result Value Ref Range   Magnesium  2.2 1.6 - 2.3 mg/dL      Assessment & Plan:   Problem List Items Addressed This Visit       Cardiovascular and Mediastinum   Chronic diastolic CHF (congestive heart failure) (HCC) (Chronic)   Chronic.  Doing well.  Does not see Cardiology.  Declines referral at this time. Would benefit from SGLT2. ECHO results from 2022 showed LVEF of 55-60%.  Appears Euvolemic in office today.   - Reminded to call for an overnight weight gain of >2 pounds or a weekly weight gain of >5 pounds - not adding salt to food and read food labels. Reviewed the importance of keeping daily sodium intake to 2000mg  daily. - Avoid Ibuprofen products.       Primary hypertension   Chronic.  Controlled.  Continue with current medication regimen of Amlodipine  daily.  Recommend checking blood pressure at home and bringing log to next visit.  Refills sent today.  Labs ordered today.  Return to clinic in 6 months for reevaluation.  Call sooner if concerns arise.       Relevant Orders   Comprehensive metabolic panel with GFR     Other   Hypercholesteremia   Labs ordered at visit today.  Will make recommendations based on lab results.         Relevant Orders   Lipid panel   Other Visit Diagnoses       Encounter for Medicare annual wellness exam    -  Primary     Acute pain of right knee       Acute on chronic pain. Will get xray due to fall yesterday. Continue with tylenol  and voltaren gel.   Relevant Orders    DG Knee Complete 4 Views Right     Acute low back pain without sciatica, unspecified back pain laterality       Having pain in her tail bone. Will order xray to evaluate further. Continue with PRN tylenol .   Relevant Orders   DG Sacrum/Coccyx     Procedure: Right  Knee Intraarticular Steroid Injection        Diagnosis:   ICD-10-CM   1. Encounter for Medicare annual wellness exam  Z00.00     2. Chronic diastolic CHF (congestive heart failure) (HCC)  I50.32     3. Primary hypertension  I10 Comprehensive metabolic panel with GFR    4. Hypercholesteremia  E78.00 Lipid panel    5. Acute pain of right knee  M25.561 DG Knee Complete 4 Views Right   Acute on chronic pain. Will get xray due to fall yesterday. Continue with tylenol  and voltaren gel.    6. Acute low back pain without sciatica, unspecified back pain laterality  M54.50 DG Sacrum/Coccyx   Having pain in her tail bone. Will order xray to evaluate further. Continue with  PRN tylenol .      Physician: @ENCPROV @ Consent:  Risks, benefits, and alternative treatments discussed and all questions were answered.  Patient elected to proceed and verbal consent obtained.  Description: Area prepped and draped using  semi-sterile technique.  Using a anterior/lateral approach, a mixture of 4 cc of  1% lidocaine  & 1 cc of Kenalog 40 was injected into knee joint.  A bandage was then placed over the injection site. Complications: none Post Procedure Instructions: Wound care instructions discussed and patient was instructed to keep area clean and dry.  Signs and symptoms of infection discussed, patient agrees to contact the office ASAP should they occur.   Follow Up: Return in about 6 months (around 07/21/2024) for HTN, HLD, DM2 FU.    Follow up plan: Return in about 6 months (around 07/21/2024) for HTN, HLD, DM2 FU.

## 2024-01-19 NOTE — Assessment & Plan Note (Signed)
 Labs ordered at visit today.  Will make recommendations based on lab results.

## 2024-01-19 NOTE — Assessment & Plan Note (Signed)
Chronic.  Controlled.  Continue with current medication regimen of Amlodipine daily.  Recommend checking blood pressure at home and bringing log to next visit.  Refills sent today.  Labs ordered today.  Return to clinic in 6 months for reevaluation.  Call sooner if concerns arise.

## 2024-01-19 NOTE — Progress Notes (Deleted)
 There were no vitals taken for this visit.   Subjective:    Patient ID: Amanda Bruce, female    DOB: March 20, 1941, 83 y.o.   MRN: [id number]  HPI: Amanda Bruce is a 83 y.o. female  No chief complaint on file.  HYPERTENSION/CHF without Chronic Kidney Disease Hypertension status: controlled  Satisfied with current treatment? yes Duration of hypertension: years BP monitoring frequency:  not checking BP range:  BP medication side effects:  no Medication compliance: excellent compliance Previous BP meds:amlodipine Aspirin: no Recurrent headaches: no Visual changes: no Palpitations: no Dyspnea: no Chest pain: no Lower extremity edema: no Dizzy/lightheaded: no  MEMORY Patient states she feels like her memory is pretty good.  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      04/07/2023    1:33 PM 02/24/2022   10:20 AM 02/24/2022    9:47 AM  MMSE - Mini Mental State Exam  Not completed:   Refused  Orientation to time 5 5   Orientation to Place 5 5   Registration 3 2   Attention/ Calculation 5 4   Recall 3 0   Language- name 2 objects 0 2   Language- repeat 1 1   Language- follow 3 step command 3 3   Language- read & follow direction 1 1   Write a sentence 1 1   Copy design 1 1   Total score 28 25      KNEE PAIN Last steroid injection was helpful for about 6 months.  Starting to feel more pain now.  She is having to decrease her dancing due to the pain.  Duration: weeks Involved knee: right Mechanism of injury: unknown Location:diffuse Onset: sudden Severity: 8/10  Quality:  sharp Frequency: constant Radiation: no Aggravating factors: weight bearing, walking, and movement  Alleviating factors:  tylenol and aspercream and brace  Status: worse Treatments attempted:  aspercream, tylenol and   brace Relief with NSAIDs?:  mild Weakness with weight bearing or walking:  yes Sensation of giving way: no Locking: no Popping: no Bruising: no Swelling: no Redness: no Paresthesias/decreased sensation: no Fevers: no  Relevant past medical, surgical, family and social history reviewed and updated as indicated. Interim medical history since our last visit reviewed. Allergies and medications reviewed and updated.  Review of Systems  Eyes:  Negative for visual disturbance.  Respiratory:  Negative for cough, chest tightness and shortness of breath.   Cardiovascular:  Negative for chest pain, palpitations and leg swelling.  Musculoskeletal:        Right knee pain  Neurological:  Negative for dizziness and headaches.    Per HPI unless specifically indicated above     Objective:    There were no vitals taken for this visit.  Wt Readings from Last 3 Encounters:  09/15/23 132 lb 6.4 oz (60.1 kg)  08/04/23 130 lb 12.8 oz (59.3 kg)  07/06/23 132 lb (59.9 kg)    Physical Exam Vitals and nursing note reviewed.  Constitutional:      General: She is not in acute distress.    Appearance: Normal appearance. She is normal weight. She is not ill-appearing, toxic-appearing or diaphoretic.  HENT:     Head: Normocephalic.     Right Ear: External ear normal.     Left Ear: External ear normal.     Nose: Nose normal.     Mouth/Throat:  Mouth: Mucous membranes are moist.     Pharynx: Oropharynx is clear.  Eyes:     General:        Right eye: No discharge.        Left eye: No discharge.     Extraocular Movements: Extraocular movements intact.     Conjunctiva/sclera: Conjunctivae normal.     Pupils: Pupils are equal, round, and reactive to light.  Cardiovascular:     Rate and Rhythm: Normal rate and regular rhythm.     Heart sounds: No murmur heard. Pulmonary:     Effort: Pulmonary effort is normal. No respiratory distress.     Breath sounds: Normal breath sounds. No wheezing or rales.  Musculoskeletal:     Cervical back: Normal range of motion and neck  supple.     Comments: Limping with right knee  Skin:    General: Skin is warm and dry.     Capillary Refill: Capillary refill takes less than 2 seconds.  Neurological:     General: No focal deficit present.     Mental Status: She is alert and oriented to person, place, and time. Mental status is at baseline.  Psychiatric:        Mood and Affect: Mood normal.        Behavior: Behavior normal.        Thought Content: Thought content normal.        Judgment: Judgment normal.    Results for orders placed or performed in visit on 06/15/23  CBC with Differential/Platelet   Collection Time: 06/15/23 11:50 AM  Result Value Ref Range   WBC 5.5 3.4 - 10.8 x10E3/uL   RBC 3.94 3.77 - 5.28 x10E6/uL   Hemoglobin 12.5 11.1 - 15.9 g/dL   Hematocrit 16.1 09.6 - 46.6 %   MCV 95 79 - 97 fL   MCH 31.7 26.6 - 33.0 pg   MCHC 33.2 31.5 - 35.7 g/dL   RDW 04.5 40.9 - 81.1 %   Platelets 303 150 - 450 x10E3/uL   Neutrophils 73 Not Estab. %   Lymphs 16 Not Estab. %   Monocytes 9 Not Estab. %   Eos 1 Not Estab. %   Basos 1 Not Estab. %   Neutrophils Absolute 4.1 1.4 - 7.0 x10E3/uL   Lymphocytes Absolute 0.9 0.7 - 3.1 x10E3/uL   Monocytes Absolute 0.5 0.1 - 0.9 x10E3/uL   EOS (ABSOLUTE) 0.1 0.0 - 0.4 x10E3/uL   Basophils Absolute 0.0 0.0 - 0.2 x10E3/uL   Immature Granulocytes 0 Not Estab. %   Immature Grans (Abs) 0.0 0.0 - 0.1 x10E3/uL  Comprehensive metabolic panel   Collection Time: 06/15/23 11:50 AM  Result Value Ref Range   Glucose 93 70 - 99 mg/dL   BUN 20 8 - 27 mg/dL   Creatinine, Ser 9.14 0.57 - 1.00 mg/dL   eGFR 91 >78 GN/FAO/1.30   BUN/Creatinine Ratio 35 (H) 12 - 28   Sodium 142 134 - 144 mmol/L   Potassium 3.9 3.5 - 5.2 mmol/L   Chloride 103 96 - 106 mmol/L   CO2 23 20 - 29 mmol/L   Calcium 9.4 8.7 - 10.3 mg/dL   Total Protein 6.8 6.0 - 8.5 g/dL   Albumin 4.6 3.7 - 4.7 g/dL   Globulin, Total 2.2 1.5 - 4.5 g/dL   Bilirubin Total 0.4 0.0 - 1.2 mg/dL   Alkaline Phosphatase 123 (H)  44 - 121 IU/L   AST 16 0 - 40 IU/L   ALT 11  0 - 32 IU/L  Magnesium   Collection Time: 06/15/23 11:50 AM  Result Value Ref Range   Magnesium 2.2 1.6 - 2.3 mg/dL      Assessment & Plan:   Problem List Items Addressed This Visit       Cardiovascular and Mediastinum   Chronic diastolic CHF (congestive heart failure) (HCC) - Primary (Chronic)   Primary hypertension     Other   Hypercholesteremia  Procedure: Right  Knee Intraarticular Steroid Injection        Diagnosis:   ICD-10-CM   1. Chronic diastolic CHF (congestive heart failure) (HCC)  I50.32     2. Primary hypertension  I10     3. Hypercholesteremia  E78.00       Physician: @ENCPROV @ Consent:  Risks, benefits, and alternative treatments discussed and all questions were answered.  Patient elected to proceed and verbal consent obtained.  Description: Area prepped and draped using  semi-sterile technique.  Using a anterior/lateral approach, a mixture of 4 cc of  1% lidocaine & 1 cc of Kenalog 40 was injected into knee joint.  A bandage was then placed over the injection site. Complications: none Post Procedure Instructions: Wound care instructions discussed and patient was instructed to keep area clean and dry.  Signs and symptoms of infection discussed, patient agrees to contact the office ASAP should they occur.   Follow Up: No follow-ups on file.    Follow up plan: No follow-ups on file.

## 2024-01-19 NOTE — Assessment & Plan Note (Signed)
 Chronic.  Doing well.  Does not see Cardiology.  Declines referral at this time. Would benefit from SGLT2. ECHO results from 2022 showed LVEF of 55-60%.  Appears Euvolemic in office today.   - Reminded to call for an overnight weight gain of >2 pounds or a weekly weight gain of >5 pounds - not adding salt to food and read food labels. Reviewed the importance of keeping daily sodium intake to 2000mg  daily. - Avoid Ibuprofen products.

## 2024-01-19 NOTE — Progress Notes (Signed)
 Subjective:   Amanda Bruce is a 83 y.o. female who presents for Medicare Annual (Subsequent) preventive examination.  Visit Complete: In person  Patient Medicare AWV questionnaire was completed by the patient on 01/19/24; I have confirmed that all information answered by patient is correct and no changes since this date.  Cardiac Risk Factors include: dyslipidemia;hypertension     Objective:    Today's Vitals   01/19/24 0937 01/19/24 0952  BP: 121/74   Pulse: 84   Temp: 98.1 F (36.7 C)   TempSrc: Oral   SpO2: 97%   Weight: 130 lb (59 kg)   Height: 5' 5 (1.651 m)   PainSc: 8  8    Body mass index is 21.63 kg/m.     01/19/2024    9:55 AM 07/06/2022    7:17 AM 06/30/2022    1:50 PM 06/16/2022    4:40 AM 06/16/2022    4:39 AM 08/12/2020    6:10 AM 08/07/2020    8:53 AM  Advanced Directives  Does Patient Have a Medical Advance Directive? Yes Yes No No No Yes Yes  Type of Advertising copywriter    Healthcare Power of Attorney Living will  Does patient want to make changes to medical advance directive? No - Patient declined No - Patient declined    No - Patient declined No - Patient declined  Copy of Healthcare Power of Attorney in Chart? No - copy requested No - copy requested    No - copy requested   Would patient like information on creating a medical advance directive?  No - Patient declined  No - Patient declined  No - Patient declined     Current Medications (verified) Outpatient Encounter Medications as of 01/19/2024  Medication Sig   acetaminophen  (TYLENOL ) 325 MG tablet Take 325-650 mg by mouth every 6 (six) hours as needed (headaches.).   amLODipine  (NORVASC ) 5 MG tablet Take 1 tablet (5 mg total) by mouth daily.   aspirin EC 81 MG tablet Take 81 mg by mouth daily.   cholecalciferol (VITAMIN D3) 25 MCG (1000 UNIT) tablet Take 1,000 Units by mouth daily.   oxybutynin  (DITROPAN ) 5 MG tablet 1 tab tid prn  frequency,urgency, bladder spasm   tretinoin  (RETIN-A ) 0.05 % cream Apply topically at bedtime. (Patient taking differently: Apply 1 application  topically 3 (three) times a week.)   Vitamin A 2400 MCG (8000 UT) CAPS Take 2,400 mcg by mouth daily.   vitamin C  (ASCORBIC ACID ) 500 MG tablet Take 500 mg by mouth daily.   Vitamin E 450 MG (1000 UT) CAPS Take 1 capsule by mouth daily.   No facility-administered encounter medications on file as of 01/19/2024.    Allergies (verified) Patient has no known allergies.   History: Past Medical History:  Diagnosis Date   Chronic diastolic CHF (congestive heart failure) (HCC)    History of kidney stones    30+ years ago   Hypercholesterolemia    Left ureteral stone 05/2022   Memory deficit    Osteoarthritis of both knees    Osteoporosis    Primary hypertension    Past Surgical History:  Procedure Laterality Date   CYSTOSCOPY WITH STENT PLACEMENT Left 07/08/2020   Procedure: CYSTOSCOPY WITH STENT PLACEMENT;  Surgeon: Twylla Glendia BROCKS, MD;  Location: [facility];  Service: Urology;  Laterality: Left;   CYSTOSCOPY/RETROGRADE/URETEROSCOPY Left 08/12/2020   Procedure: CYSTOSCOPY WITH URETEROSCOPY, with STENT removal;  Surgeon: Twylla Glendia BROCKS,  MD;  Location: [facility];  Service: Urology;  Laterality: Left;   CYSTOSCOPY/URETEROSCOPY/HOLMIUM LASER/STENT PLACEMENT Left 06/16/2022   Procedure: CYSTOSCOPY/URETEROSCOPY/RETROGRADE PYELOGRAM/STENT PLACEMENT;  Surgeon: Twylla Glendia BROCKS, MD;  Location: [facility];  Service: Urology;  Laterality: Left;   CYSTOSCOPY/URETEROSCOPY/HOLMIUM LASER/STENT PLACEMENT Left 07/06/2022   Procedure: CYSTOSCOPY/URETEROSCOPY/HOLMIUM LASER/STENT EXCHANGE;  Surgeon: Twylla Glendia BROCKS, MD;  Location: [facility];  Service: Urology;  Laterality: Left;   Family History  Problem Relation Age of Onset   Hypertension Mother    Cancer Father    Hypertension Sister    Cancer Brother    Breast cancer Other    Social History   Socioeconomic  History   Marital status: Widowed    Spouse name: Not on file   Number of children: Not on file   Years of education: Not on file   Highest education level: Not on file  Occupational History   Occupation: Immunologist for [employer]    Comment: retired  Tobacco Use   Smoking status: Never   Smokeless tobacco: Never  Vaping Use   Vaping status: Never Used  Substance and Sexual Activity   Alcohol use: No   Drug use: No   Sexual activity: Not Currently  Other Topics Concern   Not on file  Social History Narrative   Patient lives alone. Very active. Dances 3 nights a week , mows 7 acres of land!!   Feels safe in her home.   Social Drivers of Corporate investment banker Strain: Low Risk  (01/19/2024)   Overall Financial Resource Strain (CARDIA)    Difficulty of Paying Living Expenses: Not hard at all  Food Insecurity: No Food Insecurity (01/19/2024)   Hunger Vital Sign    Worried About Running Out of Food in the Last Year: Never true    Ran Out of Food in the Last Year: Never true  Transportation Needs: No Transportation Needs (12/20/2023)   Received from [facility] - Transportation    In the past 12 months, has lack of transportation kept you from medical appointments or from getting medications?: No    Lack of Transportation (Non-Medical): No  Physical Activity: Sufficiently Active (01/19/2024)   Exercise Vital Sign    Days of Exercise per Week: 3 days    Minutes of Exercise per Session: 60 min  Stress: No Stress Concern Present (01/19/2024)   [employer] of Occupational Health - Occupational Stress Questionnaire    Feeling of Stress: Not at all  Social Connections: Moderately Integrated (01/19/2024)   Social Connection and Isolation Panel    Frequency of Communication with Friends and Family: More than three times a week    Frequency of Social Gatherings with Friends and Family: More than three times a week    Attends Religious  Services: More than 4 times per year    Active Member of [employer] or Organizations: Yes    Attends Banker Meetings: Never    Marital Status: Widowed    Tobacco Counseling Counseling given: Not Answered   Clinical Intake:  Pre-visit preparation completed: Yes  Pain : 0-10 Pain Score: 8  Pain Type: Acute pain Pain Location: Knee Pain Orientation: Right Pain Onset: Yesterday Pain Frequency: Constant Pain Relieving Factors: Tylenol   Pain Relieving Factors: Tylenol   BMI - recorded: 21.63 Nutritional Status: BMI of 19-24  Normal Nutritional Risks: None Diabetes: No  How often do you need to have someone help you when you read instructions, pamphlets, or  other written materials from your doctor or pharmacy?: 1 - Never  Interpreter Needed?: No  Information entered by :: Laymon Metro, CMA Activities of Daily Living    01/19/2024    9:53 AM  In your present state of health, do you have any difficulty performing the following activities:  Hearing? 0  Vision? 0  Difficulty concentrating or making decisions? 1  Walking or climbing stairs? 0  Dressing or bathing? 0  Doing errands, shopping? 0  Preparing Food and eating ? N  Using the Toilet? N  In the past six months, have you accidently leaked urine? N  Do you have problems with loss of bowel control? N  Managing your Medications? N  Managing your Finances? N  Housekeeping or managing your Housekeeping? N    Patient Care Team: Melvin Pao, NP as PCP - General (Nurse Practitioner)  Indicate any recent Medical Services you may have received from other than [facility] providers in the past year (date may be approximate).     Assessment:   This is a routine wellness examination for Amanda Bruce.  Hearing/Vision screen No results found.   Goals Addressed   None    Depression Screen    01/19/2024    9:57 AM 09/15/2023   10:12 AM 07/06/2023    1:34 PM 06/15/2023   11:05 AM 04/07/2023    9:47 AM 10/06/2022     4:28 PM 02/08/2022    1:49 PM  PHQ 2/9 Scores  PHQ - 2 Score 0 0 0 0 0 0 0  PHQ- 9 Score 0 0 0 0 0 0 0    Fall Risk    01/19/2024    9:56 AM 09/15/2023   10:12 AM 06/15/2023   11:05 AM 04/07/2023    9:46 AM 02/08/2022    1:49 PM  Fall Risk   Falls in the past year? 0 0 0 0 0  Number falls in past yr: 0 0 0 0 0  Injury with Fall? 0 0 0 0 0  Risk for fall due to : No Fall Risks Impaired balance/gait;Impaired mobility No Fall Risks No Fall Risks No Fall Risks  Follow up Falls evaluation completed  Falls evaluation completed Falls evaluation completed Falls evaluation completed      Data saved with a previous flowsheet row definition    MEDICARE RISK AT HOME: Medicare Risk at Home Any stairs in or around the home?: Yes If so, are there any without handrails?: No Home free of loose throw rugs in walkways, pet beds, electrical cords, etc?: Yes Adequate lighting in your home to reduce risk of falls?: Yes Life alert?: No Use of a cane, walker or w/c?: No Grab bars in the bathroom?: Yes Shower chair or bench in shower?: Yes Elevated toilet seat or a handicapped toilet?: No  TIMED UP AND GO:  Was the test performed?  Yes  Length of time to ambulate 10 feet: 5 sec Gait slow and steady without use of assistive device    Cognitive Function:    04/07/2023    1:33 PM 02/24/2022   10:20 AM 02/24/2022    9:47 AM  MMSE - Mini Mental State Exam  Not completed:   Refused  Orientation to time 5 5   Orientation to Place 5 5   Registration 3 2   Attention/ Calculation 5 4   Recall 3 0   Language- name 2 objects 0 2   Language- repeat 1 1   Language- follow 3 step command  3 3   Language- read & follow direction 1 1   Write a sentence 1 1   Copy design 1 1   Total score 28 25         01/19/2024    9:56 AM 06/14/2019    9:36 AM  6CIT Screen  What Year? 4 points 0 points  What month? 0 points 3 points  What time? 3 points 0 points  Count back from 20 0 points 0 points   Months in reverse 2 points 4 points  Repeat phrase 4 points 4 points  Total Score 13 points 11 points    Immunizations Immunization History  Administered Date(s) Administered   Fluad Quad(high Dose 65+) 04/07/2022   Fluad Trivalent(High Dose 65+) 04/07/2023   Influenza,inj,Quad PF,6+ Mos 06/05/2015   Influenza-Unspecified 04/14/2016, 04/12/2017, 03/23/2018, 02/27/2019, 04/01/2021   PFIZER(Purple Top)SARS-COV-2 Vaccination 07/04/2019, 07/25/2019, 01/31/2020   Pfizer(Comirnaty)Fall Seasonal Vaccine 12 years and older 04/07/2023   Pneumococcal Conjugate-13 05/22/2014   Pneumococcal Polysaccharide-23 03/24/2006, 12/16/2008   Tdap 04/06/2011, 04/07/2022    TDAP status: Up to date  Flu Vaccine status: Up to date  Pneumococcal vaccine status: Up to date  Covid-19 vaccine status: Completed vaccines  Qualifies for Shingles Vaccine? Yes   Zostavax completed No   Shingrix Completed?: No.    Education has been provided regarding the importance of this vaccine. Patient has been advised to call insurance company to determine out of pocket expense if they have not yet received this vaccine. Advised may also receive vaccine at local pharmacy or Health Dept. Verbalized acceptance and understanding.  Screening Tests Health Maintenance  Topic Date Due   COVID-19 Vaccine (5 - 2024-25 season) 02/04/2024 (Originally 10/06/2023)   Zoster Vaccines- Shingrix (1 of 2) 04/20/2024 (Originally 06/30/1991)   INFLUENZA VACCINE  01/27/2024   Medicare Annual Wellness (AWV)  01/18/2025   DTaP/Tdap/Td (3 - Td or Tdap) 04/07/2032   Pneumococcal Vaccine: 50+ Years  Completed   DEXA SCAN  Completed   Hepatitis B Vaccines  Aged Out   HPV VACCINES  Aged Out   Meningococcal B Vaccine  Aged Out    Health Maintenance  There are no preventive care reminders to display for this patient.   Colorectal cancer screening: No longer required.   Mammogram status: No longer required due to age.  Bone Density  status: Completed 02/18/22. Results reflect: Bone density results: OSTEOPOROSIS. Repeat every 2 years.  Lung Cancer Screening: (Low Dose CT Chest recommended if Age 54-80 years, 20 pack-year currently smoking OR have quit w/in 15years.) does not qualify.   Lung Cancer Screening Referral: N/A  Additional Screening:  Hepatitis C Screening: does not qualify  Vision Screening: Recommended annual ophthalmology exams for early detection of glaucoma and other disorders of the eye. Is the patient up to date with their annual eye exam?  Yes  If pt is not established with a provider, would they like to be referred to a provider to establish care? N/A.   Dental Screening: Recommended annual dental exams for proper oral hygiene  Diabetic Foot Exam: N/A- patient is not diabetic  Community Resource Referral / Chronic Care Management: CRR required this visit?  No   CCM required this visit?  No     Plan:     I have personally reviewed and noted the following in the patient's chart:   Medical and social history Use of alcohol, tobacco or illicit drugs  Current medications and supplements including opioid prescriptions. Patient is not currently taking  opioid prescriptions. Functional ability and status Nutritional status Physical activity Advanced directives List of other physicians Hospitalizations, surgeries, and ER visits in previous 12 months Vitals Screenings to include cognitive, depression, and falls Referrals and appointments  In addition, I have reviewed and discussed with patient certain preventive protocols, quality metrics, and best practice recommendations. A written personalized care plan for preventive services as well as general preventive health recommendations were provided to patient.     Laymon LOISE Metro, [place]   01/19/2024   After Visit Summary: (In Person-Printed) AVS printed and given to the patient

## 2024-01-20 ENCOUNTER — Ambulatory Visit: Payer: Self-pay | Admitting: Nurse Practitioner

## 2024-01-20 LAB — COMPREHENSIVE METABOLIC PANEL WITH GFR
ALT: 6 IU/L (ref 0–32)
AST: 15 IU/L (ref 0–40)
Albumin: 4.4 g/dL (ref 3.7–4.7)
Alkaline Phosphatase: 237 IU/L — ABNORMAL HIGH (ref 44–121)
BUN/Creatinine Ratio: 43 — ABNORMAL HIGH (ref 12–28)
BUN: 25 mg/dL (ref 8–27)
Bilirubin Total: 0.5 mg/dL (ref 0.0–1.2)
CO2: 21 mmol/L (ref 20–29)
Calcium: 9.3 mg/dL (ref 8.7–10.3)
Chloride: 103 mmol/L (ref 96–106)
Creatinine, Ser: 0.58 mg/dL (ref 0.57–1.00)
Globulin, Total: 2.4 g/dL (ref 1.5–4.5)
Glucose: 88 mg/dL (ref 70–99)
Potassium: 3.8 mmol/L (ref 3.5–5.2)
Sodium: 144 mmol/L (ref 134–144)
Total Protein: 6.8 g/dL (ref 6.0–8.5)
eGFR: 90 mL/min/1.73 (ref >59)

## 2024-01-20 LAB — LIPID PANEL
Chol/HDL Ratio: 4 ratio (ref 0.0–4.4)
Cholesterol, Total: 190 mg/dL (ref 100–199)
HDL: 48 mg/dL (ref >39)
LDL Chol Calc (NIH): 114 mg/dL — ABNORMAL HIGH (ref 0–99)
Triglycerides: 156 mg/dL — ABNORMAL HIGH (ref 0–149)
VLDL Cholesterol Cal: 28 mg/dL (ref 5–40)

## 2024-01-25 ENCOUNTER — Telehealth: Payer: Self-pay

## 2024-01-25 NOTE — Telephone Encounter (Unsigned)
 Copied from CRM [id number]. Topic: Clinical - Medical Advice >> Jan 25, 2024  8:24 AM Myrick T wrote: Reason for CRM: patients son called requested someone give him a call back to let him know what happened when patient came to the office on 7/24. Please f/u with Ron >> Jan 25, 2024  8:28 AM Myrick T wrote: Please contact son at [phone]

## 2024-01-27 NOTE — Telephone Encounter (Signed)
 No DPR on file to speak to the patient's son. Called and notified patient of this and advised her to sign a DPR so we can speak with the patient's son.

## 2024-04-24 ENCOUNTER — Other Ambulatory Visit: Payer: Self-pay | Admitting: Nurse Practitioner

## 2024-04-24 DIAGNOSIS — I10 Essential (primary) hypertension: Secondary | ICD-10-CM

## 2024-04-25 ENCOUNTER — Other Ambulatory Visit: Payer: Self-pay | Admitting: Nurse Practitioner

## 2024-04-25 DIAGNOSIS — I10 Essential (primary) hypertension: Secondary | ICD-10-CM

## 2024-04-25 NOTE — Telephone Encounter (Signed)
 Requested Prescriptions  Pending Prescriptions Disp Refills   amLODipine  (NORVASC ) 5 MG tablet [Pharmacy Med Name: AMLODIPINE  BESYLATE 5 MG TAB] 90 tablet 0    Sig: Take 1 tablet (5 mg total) by mouth daily.     Cardiovascular: Calcium Channel Blockers 2 Passed - 04/25/2024  4:24 PM      Passed - Last BP in normal range    BP Readings from Last 1 Encounters:  01/19/24 121/74         Passed - Last Heart Rate in normal range    Pulse Readings from Last 1 Encounters:  01/19/24 84         Passed - Valid encounter within last 6 months    Recent Outpatient Visits           3 months ago Encounter for [employer] annual wellness exam   [place] [facility] Melvin Pao, NP   7 months ago Primary osteoarthritis of both knees    [facility] Melvin Pao, NP   8 months ago Vertigo    [facility] Melvin Pao, NP

## 2024-04-25 NOTE — Telephone Encounter (Signed)
 Pt's son called to report that the patient took her last pill yesterday.

## 2024-04-27 NOTE — Telephone Encounter (Signed)
 Duplicate request, refilled 04/25/24.  Requested Prescriptions  Pending Prescriptions Disp Refills   amLODipine  (NORVASC ) 5 MG tablet [Pharmacy Med Name: AMLODIPINE  BESYLATE 5 MG TAB] 30 tablet 0    Sig: Take 1 tablet (5 mg total) by mouth daily.     Cardiovascular: Calcium Channel Blockers 2 Passed - 04/27/2024 12:46 PM      Passed - Last BP in normal range    BP Readings from Last 1 Encounters:  01/19/24 121/74         Passed - Last Heart Rate in normal range    Pulse Readings from Last 1 Encounters:  01/19/24 84         Passed - Valid encounter within last 6 months    Recent Outpatient Visits           3 months ago Encounter for [employer] annual wellness exam   [place] [facility] Melvin Pao, NP   7 months ago Primary osteoarthritis of both knees   [place] [facility] Melvin Pao, NP   8 months ago Vertigo   [place] [facility] Melvin Pao, NP

## 2024-07-18 ENCOUNTER — Other Ambulatory Visit: Payer: Self-pay | Admitting: Nurse Practitioner

## 2024-07-18 DIAGNOSIS — I10 Essential (primary) hypertension: Secondary | ICD-10-CM

## 2024-07-19 NOTE — Telephone Encounter (Signed)
 Requested Prescriptions  Pending Prescriptions Disp Refills   amLODipine  (NORVASC ) 5 MG tablet [Pharmacy Med Name: AMLODIPINE  BESYLATE 5 MG TAB] 90 tablet 0    Sig: Take 1 tablet (5 mg total) by mouth daily.     Cardiovascular: Calcium Channel Blockers 2 Failed - 07/19/2024  9:50 AM      Failed - Valid encounter within last 6 months    Recent Outpatient Visits           6 months ago Encounter for Medicare annual wellness exam   [place] [facility] Melvin Pao, NP   10 months ago Primary osteoarthritis of both knees   [place] [facility] Melvin Pao, NP   11 months ago Vertigo   [place] [facility] Melvin Pao, NP              Passed - Last BP in normal range    BP Readings from Last 1 Encounters:  01/19/24 121/74         Passed - Last Heart Rate in normal range    Pulse Readings from Last 1 Encounters:  01/19/24 84

## 2024-07-23 ENCOUNTER — Ambulatory Visit: Admitting: Nurse Practitioner

## 2024-08-08 ENCOUNTER — Ambulatory Visit: Admitting: Nurse Practitioner
